# Patient Record
Sex: Female | Born: 1970 | Race: Black or African American | Hispanic: No | Marital: Married | Smoking: Never smoker
Health system: Southern US, Community
[De-identification: ages and names within clinical notes are randomized; demographics above are authoritative.]

## PROBLEM LIST (undated history)

## (undated) DIAGNOSIS — R7689 Other specified abnormal immunological findings in serum: Secondary | ICD-10-CM

## (undated) DIAGNOSIS — R058 Other specified cough: Secondary | ICD-10-CM

## (undated) DIAGNOSIS — K219 Gastro-esophageal reflux disease without esophagitis: Secondary | ICD-10-CM

## (undated) DIAGNOSIS — I10 Essential (primary) hypertension: Secondary | ICD-10-CM

## (undated) DIAGNOSIS — T464X5A Adverse effect of angiotensin-converting-enzyme inhibitors, initial encounter: Secondary | ICD-10-CM

## (undated) DIAGNOSIS — E049 Nontoxic goiter, unspecified: Secondary | ICD-10-CM

## (undated) DIAGNOSIS — D509 Iron deficiency anemia, unspecified: Secondary | ICD-10-CM

## (undated) DIAGNOSIS — R7303 Prediabetes: Secondary | ICD-10-CM

## (undated) DIAGNOSIS — D126 Benign neoplasm of colon, unspecified: Secondary | ICD-10-CM

## (undated) DIAGNOSIS — G4733 Obstructive sleep apnea (adult) (pediatric): Secondary | ICD-10-CM

## (undated) DIAGNOSIS — R768 Other specified abnormal immunological findings in serum: Secondary | ICD-10-CM

## (undated) DIAGNOSIS — E039 Hypothyroidism, unspecified: Secondary | ICD-10-CM

## (undated) DIAGNOSIS — J302 Other seasonal allergic rhinitis: Secondary | ICD-10-CM

## (undated) DIAGNOSIS — E782 Mixed hyperlipidemia: Secondary | ICD-10-CM

## (undated) HISTORY — DX: Other seasonal allergic rhinitis: J30.2

## (undated) HISTORY — DX: Other specified cough: R05.8

## (undated) HISTORY — DX: Benign neoplasm of colon, unspecified: D12.6

## (undated) HISTORY — DX: Prediabetes: R73.03

## (undated) HISTORY — DX: Other specified abnormal immunological findings in serum: R76.8

## (undated) HISTORY — DX: Mixed hyperlipidemia: E78.2

## (undated) HISTORY — DX: Gastro-esophageal reflux disease without esophagitis: K21.9

## (undated) HISTORY — DX: Obstructive sleep apnea (adult) (pediatric): G47.33

## (undated) HISTORY — DX: Essential (primary) hypertension: I10

## (undated) HISTORY — DX: Adverse effect of angiotensin-converting-enzyme inhibitors, initial encounter: T46.4X5A

## (undated) HISTORY — DX: Other specified abnormal immunological findings in serum: R76.89

## (undated) HISTORY — DX: Nontoxic goiter, unspecified: E04.9

## (undated) HISTORY — DX: Iron deficiency anemia, unspecified: D50.9

---

## 2003-12-19 HISTORY — PX: TUBAL LIGATION: SHX77

## 2004-08-09 ENCOUNTER — Encounter: Admission: RE | Admit: 2004-08-09 | Discharge: 2004-08-09 | Payer: Self-pay | Admitting: *Deleted

## 2004-08-09 ENCOUNTER — Ambulatory Visit (HOSPITAL_COMMUNITY): Admission: RE | Admit: 2004-08-09 | Discharge: 2004-08-09 | Payer: Self-pay | Admitting: *Deleted

## 2004-08-24 ENCOUNTER — Inpatient Hospital Stay (HOSPITAL_COMMUNITY): Admission: AD | Admit: 2004-08-24 | Discharge: 2004-09-01 | Payer: Self-pay | Admitting: Obstetrics and Gynecology

## 2004-08-24 ENCOUNTER — Ambulatory Visit: Payer: Self-pay | Admitting: *Deleted

## 2004-08-24 ENCOUNTER — Ambulatory Visit: Payer: Self-pay | Admitting: Sports Medicine

## 2004-08-25 ENCOUNTER — Encounter: Payer: Self-pay | Admitting: Critical Care Medicine

## 2004-08-29 ENCOUNTER — Encounter: Payer: Self-pay | Admitting: Cardiology

## 2004-09-07 ENCOUNTER — Ambulatory Visit: Payer: Self-pay | Admitting: *Deleted

## 2004-09-12 ENCOUNTER — Ambulatory Visit: Payer: Self-pay | Admitting: Obstetrics & Gynecology

## 2004-09-12 ENCOUNTER — Inpatient Hospital Stay (HOSPITAL_COMMUNITY): Admission: AD | Admit: 2004-09-12 | Discharge: 2004-09-12 | Payer: Self-pay | Admitting: Obstetrics & Gynecology

## 2004-09-14 ENCOUNTER — Ambulatory Visit: Payer: Self-pay | Admitting: Pediatrics

## 2004-09-14 ENCOUNTER — Ambulatory Visit: Payer: Self-pay | Admitting: *Deleted

## 2004-09-14 ENCOUNTER — Inpatient Hospital Stay (HOSPITAL_COMMUNITY): Admission: AD | Admit: 2004-09-14 | Discharge: 2004-09-19 | Payer: Self-pay | Admitting: Obstetrics & Gynecology

## 2004-09-18 ENCOUNTER — Encounter (INDEPENDENT_AMBULATORY_CARE_PROVIDER_SITE_OTHER): Payer: Self-pay | Admitting: Specialist

## 2004-10-18 ENCOUNTER — Ambulatory Visit: Payer: Self-pay | Admitting: Obstetrics and Gynecology

## 2004-10-24 ENCOUNTER — Encounter (INDEPENDENT_AMBULATORY_CARE_PROVIDER_SITE_OTHER): Payer: Self-pay | Admitting: Specialist

## 2004-10-24 ENCOUNTER — Ambulatory Visit (HOSPITAL_COMMUNITY): Admission: RE | Admit: 2004-10-24 | Discharge: 2004-10-24 | Payer: Self-pay | Admitting: Family Medicine

## 2004-10-25 ENCOUNTER — Ambulatory Visit: Payer: Self-pay | Admitting: Family Medicine

## 2004-12-21 ENCOUNTER — Ambulatory Visit: Payer: Self-pay | Admitting: Family Medicine

## 2005-02-23 ENCOUNTER — Ambulatory Visit: Payer: Self-pay | Admitting: Obstetrics & Gynecology

## 2005-05-12 ENCOUNTER — Ambulatory Visit: Payer: Self-pay | Admitting: Family Medicine

## 2005-07-12 ENCOUNTER — Ambulatory Visit: Payer: Self-pay | Admitting: Family Medicine

## 2005-09-04 ENCOUNTER — Ambulatory Visit: Payer: Self-pay | Admitting: Family Medicine

## 2006-01-16 ENCOUNTER — Ambulatory Visit: Payer: Self-pay | Admitting: Family Medicine

## 2006-05-23 ENCOUNTER — Encounter: Payer: Self-pay | Admitting: Family Medicine

## 2006-05-23 ENCOUNTER — Ambulatory Visit: Payer: Self-pay | Admitting: Family Medicine

## 2006-05-23 ENCOUNTER — Other Ambulatory Visit: Admission: RE | Admit: 2006-05-23 | Discharge: 2006-05-23 | Payer: Self-pay | Admitting: Family Medicine

## 2006-10-29 ENCOUNTER — Ambulatory Visit: Payer: Self-pay | Admitting: Family Medicine

## 2007-01-18 ENCOUNTER — Ambulatory Visit: Payer: Self-pay | Admitting: Family Medicine

## 2007-05-01 ENCOUNTER — Ambulatory Visit: Payer: Self-pay | Admitting: Family Medicine

## 2007-09-30 ENCOUNTER — Encounter: Payer: Self-pay | Admitting: Family Medicine

## 2007-09-30 ENCOUNTER — Ambulatory Visit: Payer: Self-pay | Admitting: Family Medicine

## 2007-09-30 ENCOUNTER — Other Ambulatory Visit: Admission: RE | Admit: 2007-09-30 | Discharge: 2007-09-30 | Payer: Self-pay | Admitting: Family Medicine

## 2008-02-03 ENCOUNTER — Ambulatory Visit: Payer: Self-pay | Admitting: Family Medicine

## 2008-02-03 LAB — CONVERTED CEMR LAB
Basophils Absolute: 0 10*3/uL (ref 0.0–0.1)
Basophils Relative: 0 % (ref 0–1)
Chloride: 104 meq/L (ref 96–112)
Cholesterol: 160 mg/dL (ref 0–200)
Creatinine, Ser: 0.91 mg/dL (ref 0.40–1.20)
Eosinophils Absolute: 0 10*3/uL (ref 0.0–0.7)
Eosinophils Relative: 1 % (ref 0–5)
Glucose, Bld: 97 mg/dL (ref 70–99)
HCT: 38.1 % (ref 36.0–46.0)
HDL: 44 mg/dL (ref >39)
Hemoglobin: 11.7 g/dL — ABNORMAL LOW (ref 12.0–15.0)
Lymphocytes Relative: 31 % (ref 12–46)
Lymphs Abs: 1.9 10*3/uL (ref 0.7–4.0)
MCHC: 30.7 g/dL (ref 30.0–36.0)
MCV: 89.9 fL (ref 78.0–100.0)
Monocytes Relative: 9 % (ref 3–12)
Neutrophils Relative %: 59 % (ref 43–77)
Platelets: 306 10*3/uL (ref 150–400)
RDW: 15.5 % (ref 11.5–15.5)
Sodium: 139 meq/L (ref 135–145)
Triglycerides: 95 mg/dL (ref <150)

## 2008-04-30 ENCOUNTER — Ambulatory Visit: Payer: Self-pay | Admitting: Family Medicine

## 2008-12-16 ENCOUNTER — Telehealth: Payer: Self-pay | Admitting: Family Medicine

## 2008-12-22 ENCOUNTER — Telehealth: Payer: Self-pay | Admitting: Family Medicine

## 2009-02-01 ENCOUNTER — Ambulatory Visit: Payer: Self-pay | Admitting: Family Medicine

## 2009-02-01 DIAGNOSIS — I10 Essential (primary) hypertension: Secondary | ICD-10-CM | POA: Insufficient documentation

## 2009-02-01 DIAGNOSIS — J3089 Other allergic rhinitis: Secondary | ICD-10-CM | POA: Insufficient documentation

## 2009-02-01 DIAGNOSIS — I1A0 Resistant hypertension: Secondary | ICD-10-CM | POA: Insufficient documentation

## 2009-02-01 LAB — CONVERTED CEMR LAB
CO2: 20 meq/L (ref 19–32)
Calcium: 9.8 mg/dL (ref 8.4–10.5)
Cholesterol: 176 mg/dL (ref 0–200)
Eosinophils Relative: 1 % (ref 0–5)
Glucose, Bld: 87 mg/dL (ref 70–99)
HCT: 39.3 % (ref 36.0–46.0)
LDL Cholesterol: 106 mg/dL — ABNORMAL HIGH (ref 0–99)
MCV: 89.9 fL (ref 78.0–100.0)
Monocytes Absolute: 0.7 10*3/uL (ref 0.1–1.0)
Neutrophils Relative %: 64 % (ref 43–77)
Potassium: 4.5 meq/L (ref 3.5–5.3)
RBC: 4.37 M/uL (ref 3.87–5.11)
TSH: 1.309 microintl units/mL (ref 0.350–4.50)
Total CHOL/HDL Ratio: 3.4
Triglycerides: 91 mg/dL (ref <150)
WBC: 8.1 10*3/uL (ref 4.0–10.5)

## 2009-05-04 ENCOUNTER — Encounter: Payer: Self-pay | Admitting: Family Medicine

## 2009-05-04 ENCOUNTER — Ambulatory Visit: Payer: Self-pay | Admitting: Family Medicine

## 2009-05-04 ENCOUNTER — Other Ambulatory Visit: Admission: RE | Admit: 2009-05-04 | Discharge: 2009-05-04 | Payer: Self-pay | Admitting: Family Medicine

## 2009-05-04 DIAGNOSIS — E049 Nontoxic goiter, unspecified: Secondary | ICD-10-CM | POA: Insufficient documentation

## 2009-05-07 ENCOUNTER — Ambulatory Visit (HOSPITAL_COMMUNITY): Admission: RE | Admit: 2009-05-07 | Discharge: 2009-05-07 | Payer: Self-pay | Admitting: Family Medicine

## 2009-05-10 ENCOUNTER — Telehealth: Payer: Self-pay | Admitting: Family Medicine

## 2009-06-22 ENCOUNTER — Ambulatory Visit: Payer: Self-pay | Admitting: Family Medicine

## 2009-09-22 ENCOUNTER — Telehealth: Payer: Self-pay | Admitting: Family Medicine

## 2009-09-27 ENCOUNTER — Ambulatory Visit: Payer: Self-pay | Admitting: Family Medicine

## 2009-09-27 DIAGNOSIS — K625 Hemorrhage of anus and rectum: Secondary | ICD-10-CM | POA: Insufficient documentation

## 2009-09-28 ENCOUNTER — Encounter (INDEPENDENT_AMBULATORY_CARE_PROVIDER_SITE_OTHER): Payer: Self-pay | Admitting: *Deleted

## 2010-02-11 ENCOUNTER — Ambulatory Visit: Payer: Self-pay | Admitting: Family Medicine

## 2010-05-02 ENCOUNTER — Telehealth: Payer: Self-pay | Admitting: Family Medicine

## 2010-07-19 ENCOUNTER — Telehealth: Payer: Self-pay | Admitting: Family Medicine

## 2010-10-03 ENCOUNTER — Telehealth: Payer: Self-pay | Admitting: Family Medicine

## 2010-12-15 ENCOUNTER — Ambulatory Visit: Payer: Self-pay | Admitting: Family Medicine

## 2010-12-28 ENCOUNTER — Telehealth: Payer: Self-pay | Admitting: Family Medicine

## 2011-01-02 ENCOUNTER — Telehealth: Payer: Self-pay | Admitting: Family Medicine

## 2011-01-04 ENCOUNTER — Ambulatory Visit
Admission: RE | Admit: 2011-01-04 | Discharge: 2011-01-04 | Payer: Self-pay | Source: Home / Self Care | Attending: Family Medicine | Admitting: Family Medicine

## 2011-01-04 ENCOUNTER — Encounter: Payer: Self-pay | Admitting: Family Medicine

## 2011-01-05 ENCOUNTER — Encounter: Payer: Self-pay | Admitting: Family Medicine

## 2011-01-05 DIAGNOSIS — E0590 Thyrotoxicosis, unspecified without thyrotoxic crisis or storm: Secondary | ICD-10-CM | POA: Insufficient documentation

## 2011-01-05 LAB — CONVERTED CEMR LAB: T3, Free: 5.7 pg/mL — ABNORMAL HIGH (ref 2.3–4.2)

## 2011-01-06 LAB — CONVERTED CEMR LAB
ALT: 8 units/L (ref 0–35)
AST: 17 units/L (ref 0–37)
BUN: 9 mg/dL (ref 6–23)
Basophils Absolute: 0 10*3/uL (ref 0.0–0.1)
Basophils Relative: 0 % (ref 0–1)
Calcium: 9.4 mg/dL (ref 8.4–10.5)
Cholesterol: 150 mg/dL (ref 0–200)
Creatinine, Ser: 0.87 mg/dL (ref 0.40–1.20)
Eosinophils Absolute: 0.1 10*3/uL (ref 0.0–0.7)
Eosinophils Relative: 1 % (ref 0–5)
Glucose, Bld: 97 mg/dL (ref 70–99)
HCT: 36.5 % (ref 36.0–46.0)
Hemoglobin: 11.8 g/dL — ABNORMAL LOW (ref 12.0–15.0)
Indirect Bilirubin: 0.3 mg/dL (ref 0.0–0.9)
Lymphocytes Relative: 26 % (ref 12–46)
Sodium: 137 meq/L (ref 135–145)
TSH: 0.008 microintl units/mL — ABNORMAL LOW (ref 0.350–4.500)
Total Bilirubin: 0.4 mg/dL (ref 0.3–1.2)
Total CHOL/HDL Ratio: 3.7
VLDL: 20 mg/dL (ref 0–40)
WBC: 6.6 10*3/uL (ref 4.0–10.5)

## 2011-01-07 ENCOUNTER — Encounter: Payer: Self-pay | Admitting: *Deleted

## 2011-01-08 ENCOUNTER — Encounter: Payer: Self-pay | Admitting: *Deleted

## 2011-01-09 ENCOUNTER — Encounter: Payer: Self-pay | Admitting: Family Medicine

## 2011-01-10 ENCOUNTER — Ambulatory Visit (HOSPITAL_COMMUNITY)
Admission: RE | Admit: 2011-01-10 | Discharge: 2011-01-10 | Payer: Self-pay | Source: Home / Self Care | Attending: Family Medicine | Admitting: Family Medicine

## 2011-01-12 ENCOUNTER — Ambulatory Visit (HOSPITAL_COMMUNITY): Admission: RE | Admit: 2011-01-12 | Payer: Self-pay | Source: Home / Self Care | Admitting: Family Medicine

## 2011-01-13 ENCOUNTER — Ambulatory Visit
Admission: RE | Admit: 2011-01-13 | Discharge: 2011-01-13 | Payer: Self-pay | Source: Home / Self Care | Attending: Family Medicine | Admitting: Family Medicine

## 2011-01-15 DIAGNOSIS — R519 Headache, unspecified: Secondary | ICD-10-CM | POA: Insufficient documentation

## 2011-01-15 DIAGNOSIS — R51 Headache: Secondary | ICD-10-CM | POA: Insufficient documentation

## 2011-01-18 ENCOUNTER — Encounter: Payer: Self-pay | Admitting: Family Medicine

## 2011-01-19 NOTE — Assessment & Plan Note (Signed)
Summary: follow up- room 2   Vital Signs:  Patient profile:   40 year old female Menstrual status:  regular Height:      64 inches Weight:      232.75 pounds BMI:     40.10 O2 Sat:      97 % on Room air Pulse rate:   90 / minute Resp:     16 per minute BP sitting:   130 / 90  (left arm)  Vitals Entered By: Adella Hare LPN (February 11, 2010 9:42 AM)  Nutrition Counseling: Patient's BMI is greater than 25 and therefore counseled on weight management options.  Serial Vital Signs/Assessments:  Time      Position  BP       Pulse  Resp  Temp     By                     128/92                         Adella Hare LPN  CC: follow-up visit Is Patient Diabetic? No Pain Assessment Patient in pain? no        CC:  follow-up visit.  History of Present Illness: Pt here for a follow up of her HTN.  She is taking her medication regularly & no probs with meds.  She denies chest pain, or periph edema.  She [facility] to the [facility] twice a wk for exercise.  Feels like she is eating healthier than she has in the past.  States that she is aware she should try to exercise more.  She also has a hx of allergic rhinitis.  Uses Allegra as needed for nasal congestion & eye syptoms.  Pt is due for her annual PE in May 2011.  Her labs were last done 01/1009.  Current Medications (verified): 1)  Benicar Hct 40-25 Mg Tabs (Olmesartan Medoxomil-Hctz) .... Take 1 Tablet By Mouth Once A Day 2)  Labetalol Hcl 200 Mg Tabs (Labetalol Hcl) .... Take 1 Tablet By Mouth Once A Day  Allergies (verified): No Known Drug Allergies  Past History:  Past medical history reviewed for relevance to current acute and chronic problems.  Past Medical History: Reviewed history from 02/01/2009 and no changes required. Hypertension obesity  Review of Systems General:  Denies chills and fever. ENT:  Denies earache, sinus pressure, and sore throat. CV:  Denies chest pain or discomfort. Resp:  Denies cough and shortness  of breath. GI:  Denies bloody stools, change in bowel habits, and dark tarry stools. Heme:  Denies enlarge lymph nodes and fevers. Allergy:  Complains of seasonal allergies.  Physical Exam  General:  Well-developed,well-nourished,in no acute distress; alert,appropriate and cooperative throughout examination Head:  Normocephalic and atraumatic without obvious abnormalities. No apparent alopecia or balding. Ears:  External ear exam shows no significant lesions or deformities.  Otoscopic examination reveals clear canals, tympanic membranes are intact bilaterally without bulging, retraction, inflammation or discharge. Hearing is grossly normal bilaterally. Nose:  External nasal examination shows no deformity or inflammation. Nasal mucosa are pink and moist without lesions or exudates. Mouth:  Oral mucosa and oropharynx without lesions or exudates.  Teeth in good repair. Neck:  No deformities, masses, or tenderness noted. Lungs:  Normal respiratory effort, chest expands symmetrically. Lungs are clear to auscultation, no crackles or wheezes. Heart:  Normal rate and regular rhythm. S1 and S2 normal without gallop, murmur, click, rub or other extra sounds.  Extremities:  No clubbing, cyanosis, edema, or deformity noted with normal full range of motion of all joints.   Cervical Nodes:  No lymphadenopathy noted Psych:  Cognition and judgment appear intact. Alert and cooperative with normal attention span and concentration. No apparent delusions, illusions, hallucinations   Impression & Recommendations:  Problem # 1:  HYPERTENSION (ICD-401.9) Assessment Unchanged Continue current meds.  Pt has refills avail.  Her updated medication list for this problem includes:    Benicar Hct 40-25 Mg Tabs (Olmesartan medoxomil-hctz) .Marland Kitchen... Take 1 tablet by mouth once a day    Labetalol Hcl 200 Mg Tabs (Labetalol hcl) .Marland Kitchen... Take 1 tablet by mouth once a day  BP today: 130/90, rpt BP 128/92 Prior BP: 110/74  (09/27/2009)  Labs Reviewed: K+: 4.5 (02/01/2009) Creat: : 1.05 (02/01/2009)   Chol: 176 (02/01/2009)   HDL: 52 (02/01/2009)   LDL: 106 (02/01/2009)   TG: 91 (02/01/2009)  Orders: T-Basic Metabolic Panel [phone]) T-Vitamin D (25-Hydroxy) ([phone])  Problem # 2:  OBESITY, UNSPECIFIED (ICD-278.00) Assessment: Deteriorated  Ht: 64 (02/11/2010)   Wt: 232.75 (02/11/2010)   BMI: 40.10 (02/11/2010)  Encouraged increased exercise, min of 3 days /wk.  Also discussed healthy diet.  Problem # 3:  ALLERGIC RHINITIS CAUSE UNSPECIFIED (ICD-477.9)  The following medications were removed from the medication list:    Allegra 180 Mg Tabs (Fexofenadine hcl) .Marland Kitchen... Take 1 tablet by mouth once a day  Discussed use of allergy medications.  Complete Medication List: 1)  Benicar Hct 40-25 Mg Tabs (Olmesartan medoxomil-hctz) .... Take 1 tablet by mouth once a day 2)  Labetalol Hcl 200 Mg Tabs (Labetalol hcl) .... Take 1 tablet by mouth once a day  Other Orders: T-Lipid Profile ([id number]) T-CBC No Diff ([id number]) T-TSH ([id number])  Patient Instructions: 1)  Please schedule a follow-up appointment in 3 months. 2)  It is important that you exercise regularly at least 20 minutes 5 times a week. If you develop chest pain, have severe difficulty breathing, or feel very tired , stop exercising immediately and seek medical attention. 3)  You need to lose weight. Consider a lower calorie diet and regular exercise.

## 2011-01-19 NOTE — Progress Notes (Signed)
Summary: medicine  Phone Note Call from Patient   Summary of Call: needs samples of blood pressure medicine [phone] Initial call taken by: Rudene Anda,  October 03, 2010 10:55 AM  Follow-up for Phone Call        no samples available  called patient, no answer Follow-up by: Adella Hare LPN,  October 03, 2010 12:10 PM     Appended Document: medicine PATIENT AWARE AND IF YOU GET ANY LET HER KNOW

## 2011-01-19 NOTE — Progress Notes (Signed)
Summary: samples  Phone Note Call from Patient   Summary of Call: said dr told her if she needed bp medicine call her for some samples she is out of work she needs samples of bp med call back at [phone] Initial call taken by: Lind Guest,  May 02, 2010 1:20 PM  Follow-up for Phone Call        Will call drug rep for some and call patient back when available Follow-up by: Everitt Amber LPN,  May 02, 2010 1:37 PM

## 2011-01-19 NOTE — Letter (Signed)
Summary: 1st missed appt  1st missed appt   Imported By: Lind Guest 12/20/2010 13:42:28  _____________________________________________________________________  External Attachment:    Type:   Image     Comment:   External Document

## 2011-01-19 NOTE — Progress Notes (Signed)
Summary: samples  Phone Note Call from Patient   Summary of Call: pt needs samples of blood pressure pills. [phone] Initial call taken by: Rudene Anda,  July 19, 2010 9:35 AM  Follow-up for Phone Call        left patient detailed voicemail that samples are available Follow-up by: Adella Hare LPN,  July 19, 2010 9:39 AM

## 2011-01-19 NOTE — Letter (Signed)
Summary: lab add on  lab add on   Imported By: Luann Bullins 01/05/2011 14:36:36  _____________________________________________________________________  External Attachment:    Type:   Image     Comment:   External Document

## 2011-01-19 NOTE — Progress Notes (Signed)
Summary: blood pressure medicine  Phone Note Call from Patient   Summary of Call: [facility] and still has not received anything  Initial call taken by: Lind Guest,  December 28, 2010 2:06 PM  Follow-up for Phone Call        spoke with pharmacy, patient has picked up meds Follow-up by: Adella Hare LPN,  December 28, 2010 2:42 PM

## 2011-01-19 NOTE — Letter (Signed)
Summary: Out of [facility]  [address]   [place], [place] [address]   Phone: [phone]  Fax: [phone]    January 04, 2011   Student:  [employer]    To Whom It May Concern:   For Medical reasons, please excuse the above named student from school for the following dates:  Start:   January 04, 2011  End:    January 09, 2011  If you need additional information, please feel free to contact our office.   Sincerely,    Milus Mallick. Lodema Hong, MD    ****This is a legal document and cannot be tampered with.  Schools are authorized to verify all information and to do so accordingly.

## 2011-01-19 NOTE — Letter (Signed)
Summary: outpatient nutritionalcare order  outpatient nutritionalcare order   Imported By: Lind Guest 01/09/2011 16:26:14  _____________________________________________________________________  External Attachment:    Type:   Image     Comment:   External Document

## 2011-01-19 NOTE — Progress Notes (Signed)
Summary: medicine  Phone Note Call from Patient   Summary of Call: pt states pharm doesn't have rx. but told her it had been sent in. may want to resend. [phone] Initial call taken by: Rudene Anda,  January 02, 2011 10:26 AM    Prescriptions: BENICAR HCT 40-25 MG TABS (OLMESARTAN MEDOXOMIL-HCTZ) Take 1 tablet by mouth once a day  #30 Tablet x 0   Entered by:   Adella Hare LPN   Authorized by:   Syliva Overman MD   Signed by:   Adella Hare LPN on 45/40/9811   Method used:   [facility] [phone]* (retail)       [address]       [place], [place]  [address]       Ph: [phone]       Fax: [phone]   RxID:   [id number] BENICAR HCT 40-25 MG TABS (OLMESARTAN MEDOXOMIL-HCTZ) Take 1 tablet by mouth once a day  #30 Tablet x 0   Entered by:   Adella Hare LPN   Authorized by:   Syliva Overman MD   Signed by:   Adella Hare LPN on 03/47/4259   Method used:   [facility]ically to        [facility] [phone]* (retail)       [address]       [place], [place]  [address]       Ph: [phone]       Fax: [phone]   RxID:   [id number]

## 2011-01-24 ENCOUNTER — Ambulatory Visit: Payer: Self-pay | Admitting: Endocrinology

## 2011-01-25 NOTE — Assessment & Plan Note (Signed)
Summary: FOLLOW UP ON B/P MEDICATION/SLJ   Vital Signs:  Patient profile:   40 year old female Menstrual status:  regular Height:      64 inches Weight:      229.25 pounds BMI:     39.49 O2 Sat:      98 % Pulse rate:   99 / minute Pulse rhythm:   regular Resp:     16 per minute BP sitting:   200 / 120  (left arm)  Vitals Entered By: Everitt Amber LPN (January 04, 2011 8:23 AM)  Nutrition Counseling: Patient's BMI is greater than 25 and therefore counseled on weight management options. CC: Follow up chronic problems   CC:  Follow up chronic problems.  History of Present Illness: Reports  that she has been well. She waslst seen approx 1 year ago, she has no concerns , and is here because of the need to refill he meds. she does walk regularly, admits that her diet needs to improve, her weight is unchanged. She is a full time student, and enjoying it. Denies recent fever or chills. Denies sinus pressure, nasal congestion , ear pain or sore throat. Denies chest congestion, or cough productive of sputum. Denies chest pain, palpitations, PND, orthopnea or leg swelling. Denies abdominal pain, nausea, vomitting, diarrhea or constipation. Denies change in bowel movements or bloody stool. Denies dysuria , frequency, incontinence or hesitancy. Denies  joint pain, swelling, or reduced mobility. Denies, vertigoor  seizures. Denies depression, anxiety or insomnia. Denies  rash, lesions, or itch.     Current Medications (verified): 1)  Benicar Hct 40-25 Mg Tabs (Olmesartan Medoxomil-Hctz) .... Take 1 Tablet By Mouth Once A Day 2)  Labetalol Hcl 200 Mg Tabs (Labetalol Hcl) .... 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 Eyes:  Denies blurring and discharge. Neuro:  Complains of headaches; 5 day h/o headache. Endo:  Denies cold intolerance, excessive hunger, excessive thirst, excessive urination, and heat intolerance. Heme:  Denies abnormal bruising and bleeding. Allergy:  Complains of seasonal allergies.  Physical Exam  General:  Well-developed,obese,in no acute distress; alert,appropriate and cooperative throughout examination HEENT: No facial asymmetry,  EOMI, No sinus tenderness, TM's Clear, oropharynx  pink and moist.   Chest: Clear to auscultation bilaterally.  CVS: S1, S2, No murmurs, No S3.   Abd: Soft, Nontender.  MS: Adequate ROM spine, hips, shoulders and knees.  Ext: No edema.   CNS: CN 2-12 intact, power tone and sensation normal throughout.   Skin: Intact, no visible lesions or rashes.  Psych: Good eye contact, normal affect.  Memory intact, not anxious or depressed appearing.    Impression & Recommendations:  Problem # 1:  HYPERTHYROIDISM (ICD-242.90) Assessment Comment Only  Her updated medication list for this problem includes:    Labetalol Hcl 200 Mg Tabs (Labetalol hcl) .Marland Kitchen... Take 1 tablet by mouth once a day  Future Orders: Endocrinology Referral (Endocrine) ... 01/06/2011 thyroid US also ordered  Problem # 2:  HYPERTENSION (ICD-401.9) Assessment: Deteriorated  The following medications were removed from the medication list:    Benicar Hct  40-25 Mg Tabs (Olmesartan medoxomil-hctz) .Marland Kitchen... Take 1 tablet by mouth once a day Her updated medication list for this problem includes:    Labetalol Hcl 200 Mg Tabs (Labetalol hcl) .Marland Kitchen... Take 1 tablet by mouth once a day    Benicar Hct 40-25 Mg Tabs (Olmesartan medoxomil-hctz) .Marland Kitchen... Take 1 tablet by mouth once a day    Amlodipine Besylate 5 Mg Tabs (Amlodipine besylate) .Marland Kitchen... Take 1 tablet by mouth once a  day Patient advised to follow low sodium diet rich in fruit and vegetables, and to commit to at least 30 minutes 5 days per week of regular exercise , to improve blood presure control.   Orders: T-Basic Metabolic Panel [id number])  BP today: 200/120 Prior BP: 130/90 (02/11/2010)  Labs Reviewed: K+: 4.5 (02/01/2009) Creat: : 1.05 (02/01/2009)   Chol: 176 (02/01/2009)   HDL: 52 (02/01/2009)   LDL: 106 (02/01/2009)   TG: 91 (02/01/2009)  Problem # 3:  OBESITY, UNSPECIFIED (ICD-278.00) Assessment: Comment Only  Orders: T-TSH ([id number]) T- Hemoglobin A1C ([id number])  Ht: 64 (01/04/2011)   Wt: 229.25 (01/04/2011)   BMI: 39.49 (01/04/2011)  Pt advised to reduce carbohydrate intake, espescially sweets, and to start regular physical activity, at least 30 minutes 5 days weekly, to enable weight loss, and reduce the risk of becoming diabetic  therapeutic lifestyle change discussed and encouraged  Problem # 5:  HEADACHE (ICD-784.0) Assessment: Comment Only  Her updated medication list for this problem includes:    Labetalol Hcl 200 Mg Tabs (Labetalol hcl) .Marland Kitchen... Take 1 tablet by mouth once a day normal neurologic exam, likely related to severe hTN, pt to take tylenol, ED if worsens  Complete Medication List: 1)  Labetalol Hcl 200 Mg Tabs (Labetalol hcl) .... Take 1 tablet by mouth once a day 2)  Benicar Hct 40-25 Mg Tabs (Olmesartan medoxomil-hctz) .... Take 1 tablet by mouth once a day 3)  Amlodipine Besylate 5 Mg Tabs (Amlodipine besylate) .... Take 1 tablet by mouth once a day  Other Orders: T-Lipid Profile [id number]) T-Hepatic Function [id number]) T-CBC w/Diff [id number]) Radiology Referral (Radiology) Future Orders: Radiology Referral (Radiology) ... 01/06/2011  Patient Instructions: 1)  nurse BP check in  7 to 10 days 2)  cPE and pap  in 4 weeks. 3)  fasting labs today. 4)  bP is extremely high, take tylenol and your meds and rest. 5)  school excuse to return  01/09/2011. 6)  we will give you the dASH diet 7)  new meds for high blood pressure as discussed 8)  BMP prior to visit, ICD-9: 9)  Hepatic Panel prior to visit, ICD-9: 10)  Lipid Panel prior to visit, ICD-9: 11)  TSH prior to visit, ICD-9: 12)  CBC w/ Diff prior to visit, ICD-9: 13)  HbgA1C prior to visit, ICD-9: Prescriptions: AMLODIPINE BESYLATE 5 MG TABS (AMLODIPINE BESYLATE) Take 1 tablet by mouth once a day  #30 x 1   Entered and Authorized by:   Syliva Overman MD   Signed by:   Syliva Overman MD on 01/04/2011   Method used:   [facility] to        [facility]. [phone]* (retail)       [address]       [place], [place]  [address]       Ph: [phone]       Fax: [phone]   RxID:   [id number] BENICAR HCT 40-25 MG TABS (OLMESARTAN MEDOXOMIL-HCTZ) Take 1 tablet  by mouth once a day  #30 x 2   Entered and Authorized by:   Syliva Overman MD   Signed by:   Syliva Overman MD on 01/04/2011   Method used:   Electronically to        [facility]. [phone]* (retail)       [address]       [place], [place]  [address]       Ph: [phone]       Fax: [phone]   RxID:   [id number]    Orders Added: 1)  Est. Patient Level IV [27253] 2)  Est. Patient Level IV [66440] 3)  T-Basic Metabolic Panel [[id number]] 4)  T-Lipid Profile [[id number]] 5)  T-Hepatic Function [[id number]] 6)  T-CBC w/Diff [[id number]] 7)  T-TSH [[id number]] 8)  T- Hemoglobin A1C [83036-23375] 9)  Radiology Referral [Radiology] 10)  Radiology Referral [Radiology] 11)  Endocrinology Referral [Endocrine]

## 2011-01-25 NOTE — Assessment & Plan Note (Signed)
Summary: B P CHECK  Nurse Visit   Vital Signs:  Patient profile:   40 year old female Menstrual status:  regular Height:      64 inches Weight:      228 pounds BP sitting:   120 / 88 Cuff size:   xl  Vitals Entered By: Everitt Amber LPN (January 13, 2011 11:27 AM) Comments Nurse visit for BP check    Allergies: No Known Drug Allergies bP rechecked and is 120/88, pt to continue current meds

## 2011-01-25 NOTE — Letter (Signed)
Summary: adult oupatient assessment form  adult oupatient assessment form   Imported By: Lind Guest 01/18/2011 11:30:46  _____________________________________________________________________  External Attachment:    Type:   Image     Comment:   External Document

## 2011-02-08 ENCOUNTER — Telehealth: Payer: Self-pay | Admitting: Family Medicine

## 2011-02-08 ENCOUNTER — Encounter (INDEPENDENT_AMBULATORY_CARE_PROVIDER_SITE_OTHER): Payer: PRIVATE HEALTH INSURANCE | Admitting: Family Medicine

## 2011-02-08 ENCOUNTER — Other Ambulatory Visit (HOSPITAL_COMMUNITY)
Admission: RE | Admit: 2011-02-08 | Discharge: 2011-02-08 | Disposition: A | Discharge status: Home / Self Care | Payer: PRIVATE HEALTH INSURANCE | Source: Ambulatory Visit | Attending: Family Medicine | Admitting: Family Medicine

## 2011-02-08 ENCOUNTER — Encounter: Payer: Self-pay | Admitting: Family Medicine

## 2011-02-08 ENCOUNTER — Other Ambulatory Visit: Payer: Self-pay | Admitting: Family Medicine

## 2011-02-08 DIAGNOSIS — R7301 Impaired fasting glucose: Secondary | ICD-10-CM | POA: Insufficient documentation

## 2011-02-08 DIAGNOSIS — Z0000 Encounter for general adult medical examination without abnormal findings: Secondary | ICD-10-CM

## 2011-02-08 DIAGNOSIS — Z01419 Encounter for gynecological examination (general) (routine) without abnormal findings: Secondary | ICD-10-CM | POA: Insufficient documentation

## 2011-02-08 DIAGNOSIS — Z1211 Encounter for screening for malignant neoplasm of colon: Secondary | ICD-10-CM

## 2011-02-08 DIAGNOSIS — Z124 Encounter for screening for malignant neoplasm of cervix: Secondary | ICD-10-CM

## 2011-02-08 LAB — CONVERTED CEMR LAB: OCCULT 1: POSITIVE

## 2011-02-13 ENCOUNTER — Telehealth: Payer: Self-pay | Admitting: Family Medicine

## 2011-02-13 ENCOUNTER — Encounter: Payer: Self-pay | Admitting: Family Medicine

## 2011-02-13 DIAGNOSIS — K921 Melena: Secondary | ICD-10-CM | POA: Insufficient documentation

## 2011-02-13 LAB — CONVERTED CEMR LAB: OCCULT 3: POSITIVE

## 2011-02-14 ENCOUNTER — Telehealth (INDEPENDENT_AMBULATORY_CARE_PROVIDER_SITE_OTHER): Payer: Self-pay | Admitting: *Deleted

## 2011-02-14 NOTE — Progress Notes (Signed)
Summary: medicine  Phone Note Call from Patient   Summary of Call: [facility] [place] Initial call taken by: Lind Guest,  February 08, 2011 4:13 PM  Follow-up for Phone Call        already sent  Follow-up by: Everitt Amber LPN,  February 08, 2011 4:19 PM

## 2011-02-15 ENCOUNTER — Other Ambulatory Visit (HOSPITAL_COMMUNITY): Payer: Self-pay | Admitting: "Endocrinology

## 2011-02-15 ENCOUNTER — Encounter: Payer: Self-pay | Admitting: Family Medicine

## 2011-02-15 DIAGNOSIS — R7989 Other specified abnormal findings of blood chemistry: Secondary | ICD-10-CM

## 2011-02-16 ENCOUNTER — Encounter: Payer: Self-pay | Admitting: Family Medicine

## 2011-02-22 ENCOUNTER — Encounter (HOSPITAL_COMMUNITY): Payer: Self-pay

## 2011-02-22 ENCOUNTER — Encounter (HOSPITAL_COMMUNITY)
Admission: RE | Admit: 2011-02-22 | Discharge: 2011-02-22 | Disposition: A | Discharge status: Home / Self Care | Payer: PRIVATE HEALTH INSURANCE | Source: Ambulatory Visit | Attending: "Endocrinology | Admitting: "Endocrinology

## 2011-02-22 DIAGNOSIS — E042 Nontoxic multinodular goiter: Secondary | ICD-10-CM | POA: Insufficient documentation

## 2011-02-22 DIAGNOSIS — E349 Endocrine disorder, unspecified: Secondary | ICD-10-CM | POA: Insufficient documentation

## 2011-02-22 DIAGNOSIS — R7989 Other specified abnormal findings of blood chemistry: Secondary | ICD-10-CM

## 2011-02-22 MED ORDER — SODIUM IODIDE I 131 CAPSULE
10.0000 | Freq: Once | INTRAVENOUS | Status: AC | PRN
  Administered 2011-02-22: 17 via ORAL

## 2011-02-23 ENCOUNTER — Ambulatory Visit (HOSPITAL_COMMUNITY): Admission: RE | Admit: 2011-02-23 | Payer: PRIVATE HEALTH INSURANCE | Source: Ambulatory Visit

## 2011-02-23 MED ORDER — SODIUM PERTECHNETATE TC 99M INJECTION
10.0000 | Freq: Once | INTRAVENOUS | Status: AC | PRN
  Administered 2011-02-23: 10 via INTRAVENOUS

## 2011-02-23 NOTE — Assessment & Plan Note (Signed)
Summary: PHYSICAL   Vital Signs:  Patient profile:   40 year old female Menstrual status:  regular Height:      64 inches Weight:      235 pounds BMI:     40.48 O2 Sat:      97 % Pulse rate:   87 / minute Pulse rhythm:   regular Resp:     16 per minute BP supine:   140 / 90  (right arm) BP sitting:   122 / 90  (left arm) Cuff size:   xl  Vitals Entered By: Everitt Amber LPN (February 08, 2011 3:20 PM)  Nutrition Counseling: Patient's BMI is greater than 25 and therefore counseled on weight management options. CC: CPE   Vision Screening:Left eye with correction: 20 / 20 Right eye with correction: 20 / 20 Both eyes with correction: 20 / 20  Color vision testing: normal       CC:  CPE .  History of Present Illness: Reports  that she has been doing fairly well. Denies recent fever or chills. Denies sinus pressure, nasal congestion , ear pain or sore throat. Denies chest congestion, or cough productive of sputum. Denies chest pain, palpitations, PND, orthopnea or leg swelling. Denies abdominal pain, nausea, vomitting, diarrhea or constipation. Denies change in bowel movements or bloody stool. Denies dysuria , frequency, incontinence or hesitancy. Denies  joint pain, swelling, or reduced mobility. Denies headaches, vertigo, seizures. Denies depression, anxiety or insomnia. Denies  rash, lesions, or itch.     Current Medications (verified): 1)  Labetalol Hcl 200 Mg Tabs (Labetalol Hcl) .... Take 1 Tablet By Mouth Once A Day 2)  Benicar Hct 40-25 Mg Tabs (Olmesartan Medoxomil-Hctz) .... Take 1 Tablet By Mouth Once A Day 3)  Amlodipine Besylate 5 Mg Tabs (Amlodipine Besylate) .... Take 1 Tablet By Mouth Once A Day  Allergies (verified): No Known Drug Allergies  Review of Systems      See HPI General:  Complains of fatigue. Eyes:  Denies blurring, discharge, eye pain, and red eye. Endo:  Denies cold intolerance, excessive hunger, excessive thirst, and  polyuria. Heme:  Denies abnormal bruising, bleeding, enlarge lymph nodes, and fevers. Allergy:  Complains of seasonal allergies.  Physical Exam  General:  Well-developed,obese,in no acute distress; alert,appropriate and cooperative throughout examination Head:  Normocephalic and atraumatic without obvious abnormalities. No apparent alopecia or balding. Eyes:  No corneal or conjunctival inflammation noted. EOMI. Perrla. Funduscopic exam benign, without hemorrhages, exudates or papilledema. Vision grossly normal. Ears:  External ear exam shows no significant lesions or deformities.  Otoscopic examination reveals clear canals, tympanic membranes are intact bilaterally without bulging, retraction, inflammation or discharge. Hearing is grossly normal bilaterally. Nose:  External nasal examination shows no deformity or inflammation. Nasal mucosa are pink and moist without lesions or exudates. Mouth:  Oral mucosa and oropharynx without lesions or exudates.  Teeth in good repair. Neck:  No deformities, masses, or tenderness noted. Chest Wall:  No deformities, masses, or tenderness noted. Breasts:  No mass, nodules, thickening, tenderness, bulging, retraction, inflamation, nipple discharge or skin changes noted.   Lungs:  Normal respiratory effort, chest expands symmetrically. Lungs are clear to auscultation, no crackles or wheezes. Heart:  Normal rate and regular rhythm. S1 and S2 normal without gallop, murmur, click, rub or other extra sounds. Abdomen:  Bowel sounds positive,abdomen soft and non-tender without masses, organomegaly or hernias noted. Rectal:  No external abnormalities noted. Normal sphincter tone. No rectal masses or tenderness.Guaic positive stool Genitalia:  Normal introitus for age, no external lesions, no vaginal discharge, mucosa pink and moist, no vaginal or cervical lesions, no vaginal atrophy, no friaility or hemorrhage, normal uterus size and position, no adnexal masses or  tenderness Msk:  No deformity or scoliosis noted of thoracic or lumbar spine.   Pulses:  R and L carotid,radial,femoral,dorsalis pedis and posterior tibial pulses are full and equal bilaterally Extremities:  No clubbing, cyanosis, edema, or deformity noted with normal full range of motion of all joints.   Neurologic:  No cranial nerve deficits noted. Station and gait are normal. Plantar reflexes are down-going bilaterally. DTRs are symmetrical throughout. Sensory, motor and coordinative functions appear intact. Skin:  Intact without suspicious lesions or rashes Cervical Nodes:  No lymphadenopathy noted Axillary Nodes:  No palpable lymphadenopathy Inguinal Nodes:  No significant adenopathy Psych:  Cognition and judgment appear intact. Alert and cooperative with normal attention span and concentration. No apparent delusions, illusions, hallucinations   Impression & Recommendations:  Problem # 1:  GUAIAC POSITIVE STOOL (ICD-578.1) Assessment Comment Only  Orders: Gastroenterology Referral (GI)  Problem # 2:  SCREENING FOR MALIGNANT NEOPLASM OF THE CERVIX (ICD-V76.2) Assessment: Comment Only pap sent Orders: Pap Smear (16109)  Problem # 3:  IMPAIRED FASTING GLUCOSE (ICD-790.21) Assessment: Comment Only  Orders: T- Hemoglobin A1C (83036-23375),value is6.2, pt is prediaBETIC, AND IS COUNSELLED EXTENSIVELY  Problem # 4:  OBESITY, UNSPECIFIED (ICD-278.00) Assessment: Deteriorated  Ht: 64 (02/08/2011)   Wt: 235 (02/08/2011)   BMI: 40.48 (02/08/2011) therapeutic lifestyle change discussed and encouraged  Problem # 5:  HYPERTENSION (ICD-401.9) Assessment: Unchanged  Her updated medication list for this problem includes:    Labetalol Hcl 200 Mg Tabs (Labetalol hcl) .Marland Kitchen... Take 1 tablet by mouth once a day    Hyzaar 100-25 Mg Tabs (Losartan potassium-hctz) .Marland Kitchen... Take 1 tablet by mouth once a day    Amlodipine Besylate 5 Mg Tabs (Amlodipine besylate) .Marland Kitchen... Take 1 tablet by mouth once a  day  Orders: T-Basic Metabolic Panel [phone])  BP today: 122/90 Prior BP: 120/88 (01/13/2011)  Labs Reviewed: K+: 4.4 (01/04/2011) Creat: : 0.87 (01/04/2011)   Chol: 150 (01/04/2011)   HDL: 41 (01/04/2011)   LDL: 89 (01/04/2011)   TG: 102 (01/04/2011)  Problem # 6:  HYPERTHYROIDISM (ICD-242.90) Assessment: Comment Only  Her updated medication list for this problem includes:    Labetalol Hcl 200 Mg Tabs (Labetalol hcl) .Marland Kitchen... Take 1 tablet by mouth once a day  Orders: Endocrinology Referral (Endocrine)  Labs Reviewed: TSH: 0.008 (01/04/2011)   Total T4: 13.4 (01/05/2011)     Complete Medication List: 1)  Labetalol Hcl 200 Mg Tabs (Labetalol hcl) .... Take 1 tablet by mouth once a day 2)  Hyzaar 100-25 Mg Tabs (Losartan potassium-hctz) .... Take 1 tablet by mouth once a day 3)  Amlodipine Besylate 5 Mg Tabs (Amlodipine besylate) .... Take 1 tablet by mouth once a day 4)  Loratidine 10mg   .... Take 1 tablet by mouth once a day  Other Orders: Hemoccult Guaiac-1 spec.(in office) (91478)  Patient Instructions: 1)  f/u April  20 or after 2)  It is important that you exercise regularly at least 20 minutes 5 times a week. If you develop chest pain, have severe difficulty breathing, or feel very tired , stop exercising immediately and seek medical attention. 3)  You need to lose weight. Consider a lower calorie diet and regular exercise.  4)  HbgA1C prior to visit, ICD-9:  April 18 or after, and chem 7 Prescriptions: HYZAAR  100-25 MG TABS (LOSARTAN POTASSIUM-HCTZ) Take 1 tablet by mouth once a day  #30 x 3   Entered by:   Everitt Amber LPN   Authorized by:   Syliva Overman MD   Signed by:   Everitt Amber LPN on 81/19/1478   Method used:   Printed then faxed to ...       [facility]  [address] [phone]* (retail)       [address]       [place], [place]  [address]       Ph: [phone]       Fax: [phone]   RxID:   [id number] AMLODIPINE BESYLATE 5 MG TABS  (AMLODIPINE BESYLATE) Take 1 tablet by mouth once a day  #30 x 4   Entered by:   Everitt Amber LPN   Authorized by:   Syliva Overman MD   Signed by:   Everitt Amber LPN on 25/95/6387   Method used:   Electronically to        [facility] [phone]* (retail)       [address]       [place], [place]  [address]       Ph: [phone]       Fax: [phone]   RxID:   [id number] LORATIDINE 10MG  Take 1 tablet by mouth once a day  #90 x 1   Entered by:   Everitt Amber LPN   Authorized by:   Syliva Overman MD   Signed by:   Everitt Amber LPN on 62/83/1517   Method used:   Printed then faxed to ...       [facility] [phone]* (retail)       [address]       [place], [place]  [address]       Ph: [phone]       Fax: [phone]   RxID:   [id number]  Benicar changed to Hyzaar per Dr. Lodema Hong due to non coverage   Orders Added: 1)  Est. Patient 40-64 years [99396] 2)  T-Basic Metabolic Panel [phone] 3)  T- Hemoglobin A1C [83036-23375] 4)  Hemoccult Guaiac-1 spec.(in office) [82270] 5)  Pap Smear [88150] 6)  Endocrinology Referral [Endocrine] 7)  Gastroenterology Referral [GI]    Laboratory Results    Stool - Occult Blood Hemmoccult #1: positive Date: 02/08/2011 Comments: 51301 13L 10/13 5030 5/14

## 2011-02-23 NOTE — Letter (Signed)
Summary: D/C MEDICINE  D/C MEDICINE   Imported By: Lind Guest 02/15/2011 10:50:42  _____________________________________________________________________  External Attachment:    Type:   Image     Comment:   External Document

## 2011-02-23 NOTE — Progress Notes (Signed)
Summary: bp medicine  Phone Note Call from Patient   Summary of Call: would like to get another blood pressure med. pt states something about benica. please give her a call [phone] Initial call taken by: Rudene Anda,  February 13, 2011 11:19 AM  Follow-up for Phone Call        Insurance not covering Benicar. please change to something cheaper Follow-up by: Everitt Amber LPN,  February 14, 2011 2:17 PM  Additional Follow-up for Phone Call Additional follow up Details #1::        Changed to Hyzaar 100/25 per dr. Lodema Hong. Patient aware Additional Follow-up by: Everitt Amber LPN,  February 14, 2011 4:31 PM

## 2011-02-23 NOTE — Progress Notes (Signed)
Summary: referral  Phone Note Call from Patient   Summary of Call: pt has appt with dr. Karilyn Cota on 03/13/2011 2:15. pt is aware Initial call taken by: Rudene Anda,  February 14, 2011 9:04 AM

## 2011-02-23 NOTE — Miscellaneous (Signed)
Summary: Stool cards  Clinical Lists Changes  Observations: Added new observation of HEMOCCU 3 DT: 02/13/2011 (02/13/2011 10:45) Added new observation of HEMOCCU 2 DT: 02/12/2011 (02/13/2011 10:45) Added new observation of HEMOCCU 1 DT: 02/11/2011 (02/13/2011 10:45) Added new observation of LAB RESULTS: 51201 8L 8/13 5030 5/14 (02/13/2011 10:45) Added new observation of HEMOCCULT 3: positive (02/13/2011 10:45) Added new observation of HEMOCCULT 2: positive (02/13/2011 10:45) Added new observation of HEMOCCULT 1: positive (02/13/2011 10:45)     Laboratory Results    Stool - Occult Blood Hemmoccult #1: positive Date: 02/11/2011 Hemoccult #2: positive Date: 02/12/2011 Hemoccult #3: positive Date: 02/13/2011 Comments: 51201 8L 8/13 5030 5/14

## 2011-02-23 NOTE — Letter (Signed)
Summary: Dr. Fransico Him  Dr. Fransico Him   Imported By: Lind Guest 02/17/2011 14:47:58  _____________________________________________________________________  External Attachment:    Type:   Image     Comment:   External Document

## 2011-02-24 ENCOUNTER — Other Ambulatory Visit (HOSPITAL_COMMUNITY): Payer: Self-pay | Admitting: "Endocrinology

## 2011-02-24 DIAGNOSIS — E0590 Thyrotoxicosis, unspecified without thyrotoxic crisis or storm: Secondary | ICD-10-CM

## 2011-02-28 ENCOUNTER — Encounter (HOSPITAL_COMMUNITY): Payer: Self-pay

## 2011-02-28 ENCOUNTER — Ambulatory Visit (HOSPITAL_COMMUNITY)
Admission: RE | Admit: 2011-02-28 | Discharge: 2011-02-28 | Disposition: A | Discharge status: Home / Self Care | Payer: PRIVATE HEALTH INSURANCE | Source: Ambulatory Visit | Attending: "Endocrinology | Admitting: "Endocrinology

## 2011-02-28 DIAGNOSIS — E0590 Thyrotoxicosis, unspecified without thyrotoxic crisis or storm: Secondary | ICD-10-CM | POA: Insufficient documentation

## 2011-02-28 MED ORDER — SODIUM IODIDE I 131 CAPSULE
25.0000 | Freq: Once | INTRAVENOUS | Status: AC | PRN
  Administered 2011-02-28: 26.8 via ORAL

## 2011-03-08 ENCOUNTER — Other Ambulatory Visit: Payer: Self-pay

## 2011-03-08 MED ORDER — AMLODIPINE BESYLATE 10 MG PO TABS: 5.0000 mg | ORAL_TABLET | Freq: Every day | ORAL | Status: DC

## 2011-03-11 ENCOUNTER — Other Ambulatory Visit: Payer: Self-pay | Admitting: Family Medicine

## 2011-03-13 ENCOUNTER — Ambulatory Visit (INDEPENDENT_AMBULATORY_CARE_PROVIDER_SITE_OTHER): Payer: PRIVATE HEALTH INSURANCE | Admitting: Internal Medicine

## 2011-03-13 DIAGNOSIS — R195 Other fecal abnormalities: Secondary | ICD-10-CM

## 2011-03-23 ENCOUNTER — Encounter (HOSPITAL_BASED_OUTPATIENT_CLINIC_OR_DEPARTMENT_OTHER): Payer: PRIVATE HEALTH INSURANCE | Admitting: Internal Medicine

## 2011-03-23 ENCOUNTER — Other Ambulatory Visit (INDEPENDENT_AMBULATORY_CARE_PROVIDER_SITE_OTHER): Payer: Self-pay | Admitting: Internal Medicine

## 2011-03-23 ENCOUNTER — Ambulatory Visit (HOSPITAL_COMMUNITY)
Admission: RE | Admit: 2011-03-23 | Discharge: 2011-03-23 | Disposition: A | Discharge status: Home / Self Care | Payer: PRIVATE HEALTH INSURANCE | Source: Ambulatory Visit | Attending: Internal Medicine | Admitting: Internal Medicine

## 2011-03-23 DIAGNOSIS — Z79899 Other long term (current) drug therapy: Secondary | ICD-10-CM | POA: Insufficient documentation

## 2011-03-23 DIAGNOSIS — D126 Benign neoplasm of colon, unspecified: Secondary | ICD-10-CM | POA: Insufficient documentation

## 2011-03-23 DIAGNOSIS — R195 Other fecal abnormalities: Secondary | ICD-10-CM

## 2011-03-23 DIAGNOSIS — K921 Melena: Secondary | ICD-10-CM | POA: Insufficient documentation

## 2011-03-23 DIAGNOSIS — K5730 Diverticulosis of large intestine without perforation or abscess without bleeding: Secondary | ICD-10-CM | POA: Insufficient documentation

## 2011-03-23 DIAGNOSIS — I10 Essential (primary) hypertension: Secondary | ICD-10-CM | POA: Insufficient documentation

## 2011-03-26 NOTE — Consult Note (Addendum)
NAME:  Tracy Joseph, NICHTER              ACCOUNT NO.:  [id number]  MEDICAL RECORD NO.:  [id number]           PATIENT TYPE:  LOCATION:                                 FACILITY:  PHYSICIAN:  Lionel December, M.D.    DATE OF BIRTH:  03/01/1971  DATE OF CONSULTATION: DATE OF DISCHARGE:                                CONSULTATION   REASON FOR CONSULTATION:  This is an office visit for rectal bleeding and heme positive stools.  HISTORY OF PRESENT ILLNESS:  [name] is a 40 year old female referred to our office by Dr. Syliva Overman.  Tracy Joseph states she has been seen blood in her stool for approximately 9 months.  She says the blood is mixed in her stool.  She says she sees blood in her stool 90% of the time when she has a bowel movement.  She denies any abdominal pain.  There has been no rectal pain.  Her stools are normal size.  They are brown in color.  She denies any constipation and diarrhea.  She usually has a bowel movement daily.  She was positive in Dr. Anthony Sar office in February.  She says her appetite is good.  There has been no weight loss.  She actually has been gaining weight.  They are evaluating her thyroid.  Her H and H on February 13, 2011, 11.8 and 36.5.  Her MCV was 83.  She denies any abdominal pain.  No nausea or vomiting.  No hemorrhoids.  ALLERGIES:  There are no known allergies.  HOME MEDICATIONS: 1. Labetalol 200 mg a day. 2. Amlodipine 5 mg a day. 3. Benicar 40/25 mg a day. 4. Allergy Relief as needed.  SURGERIES:  She had right shoulder surgery for dislocated shoulder and she has had a tubal ligation.  MEDICAL HISTORY:  Hypertension.  FAMILY HISTORY:  There is no family history of colon cancer.  FAMILY HISTORY:  Her mother is alive, in good health.  Her father is deceased from an MVC.  No sisters.  One brother in good health.  She is married.  She works with a [employer].  She does not smoke, drink, or do drugs.  She has 1 child, in good  health.  Her last period was February 16, 2011, and she is due again next week.  OBJECTIVE:  HEENT:  She has natural teeth.  Her oral mucosa is moist. There are no lesions.  Her conjunctivae are pink.  Her sclerae are anicteric. NECK:  Her thyroid is enlarged.  There is fullness to her neck.  There is no cervical lymphadenopathy. LUNGS:  Clear. HEART:  Regular rate and rhythm. ABDOMEN:  Soft.  Bowel sounds are positive.  No masses and no tenderness.  Her stool was grossly guaiac positive, but it was brown in color. EXTREMITIES:  There is no edema to her extremities.  ASSESSMENT:  Tracy Joseph is a 40 year old female presenting today with hematochezia.  A colonic neoplasm needs to be ruled out.  The differential would include an arteriovenous malformation, polyp, or internal hemorrhoids.  RECOMMENDATIONS:  We will schedule a colonoscopy in the near future with Dr. Karilyn Cota.  The risk and benefits were reviewed with the patient and she is agreeable to proceed.    ______________________________ Dorene Ar, NP   ______________________________Najeeb Karilyn Cota, M.D.    TS/MEDQ  D:  03/13/2011  T:  03/14/2011  Job:  [id number]  cc:   Milus Mallick. Lodema Hong, M.D. Fax: [phone]  Electronically Signed by Dorene Ar PA on 03/22/2011 05:00:30 PM Electronically Signed by Lionel December M.D. on 03/26/2011 01:13:47 PM

## 2011-04-06 ENCOUNTER — Encounter: Payer: Self-pay | Admitting: Family Medicine

## 2011-04-07 ENCOUNTER — Encounter: Payer: Self-pay | Admitting: Family Medicine

## 2011-04-10 ENCOUNTER — Encounter: Payer: Self-pay | Admitting: Family Medicine

## 2011-04-10 ENCOUNTER — Ambulatory Visit: Payer: PRIVATE HEALTH INSURANCE | Admitting: Family Medicine

## 2011-05-05 NOTE — H&P (Signed)
NAME:  Tracy Joseph, Tracy Joseph                        ACCOUNT NO.:  [id number]   MEDICAL RECORD NO.:  [id number]                   PATIENT TYPE:  INP   LOCATION:  2113                                 FACILITY:  [facility]   PHYSICIAN:  Lurena Joiner L. Jolinda Croak, M.D.          DATE OF BIRTH:  1971/10/01   DATE OF ADMISSION:  08/24/2004  DATE OF DISCHARGE:                                HISTORY & PHYSICAL   ATTENDING PHYSICIAN:  Talmage Nap, M.D.   CHIEF COMPLAINT:  Elevated blood pressure.   HISTORY OF PRESENT ILLNESS:  The patient is a 40 year old female with an  intrauterine pregnancy at 20-0/7 weeks who [facility]  the day prior to this History and Physical with blood pressures up to the  230s to 130s range.  She was initially kept on OB Teaching Service but was  subsequently transferred to the MICU in the care of critical care medicine  because she is at an early stage of pregnancy and was considered to be  malignant hypertension and not preeclampsia.  Family Practice Teaching  Service was then asked to assume care.  Blood pressures have responded to  clonidine, labetalol, hydralazine, and nitroprusside.  She is currently  being weaned off the nitroprusside.  Of note, the patient has a history of  chronic hypertension as well as preeclampsia and eclampsia with an IUFD and  seizure in 1998.   REVIEW OF SYSTEMS:  Denies visual changes, headaches currently--she did have  one yesterday--numbness or tingling of the extremities, no current seizure.  Denies chest pain, shortness of breath, dysuria, vaginal discharge.  Does  complain of nausea and vomiting and mild bilateral lower extremity edema  which is better now.  Does note fetal activity.   PAST MEDICAL HISTORY:  Significant for chronic hypertension, a dislocated  shoulder with the seizure she had in 1998, and of note she had a renal  ultrasound in December of 1998 that showed no renal artery stenosis.   PAST  GYNECOLOGIC HISTORY:  She is a G 4, P 1-1-1-1.  G 1 was a spontaneous  vaginal delivery, full-term infant.  G 2 spontaneous abortion.  G 3 IUFD  with eclamptic seizure at 24 weeks.  G 4 is current pregnancy.  The patient  is Rh negative.   PAST SURGICAL HISTORY:  None.   No known drug allergies.   MEDICATIONS AT HOME:  Labetalol, prenatal vitamins, and a baby aspirin.   CURRENT MEDICATIONS:  Aldomet, labetalol, aspirin 81 mg, prenatal vitamins,  Colace, Zofran, hydralazine, clonidine, Avapro, and nitroprusside drip.  The  clonidine and Avapro have been discontinued.   FAMILY HISTORY:  Significant for chronic hypertension, pregnancy-induced  hypertension, and preeclampsia.   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  She lives in  [place] with her husband and 46 year old daughter.  Denies alcohol,  tobacco, or illicit drug use.   PHYSICAL EXAMINATION:  VITAL  SIGNS:  T-max and current 98.2, blood pressure  155/65 with a range of 125 to 185 over 50 to 85, mean arterial pressure 90  with a range from 85 to 11, heart rate 105, respiratory rate 24.  Saturating  94% on 2 liters nasal cannula.  Eight hour I's and O's:  308 in, 0 out.  GENERAL:  The patient is in no acute distress.  She is obese, pleasant.  HEENT:  Pupils equal, round, react to light.  Extraocular motions are  intact.  Oropharynx is without erythema or exudate.  Her right eye has some  scleral injection.  Her fundi are within normal limits bilaterally.  NECK:  She has no lymphadenopathy and no thyromegaly.  CARDIOVASCULAR:  She is slightly tachycardic with a regular rhythm.  No  murmur is noted.  She has 2+ distal pulses.  PULMONARY:  She has decreased breath sounds at the bases secondary to her  body habitus but is otherwise clear to auscultation bilaterally.  She has  fair air movement.  ABDOMEN:  Gravid, soft, nontender, nondistended with normoactive bowel  sounds.  EXTREMITIES:  Are without  edema.  NEUROLOGICAL:  Cranial nerves II-XII are grossly intact.  She has 5/5  strength bilateral upper and lower extremities, 2+ DTRs bilateral upper and  lower extremities, and no clonus.   An EKG showed no acute changes.  Chest x-ray from Sept 7th showed mild  cardiomegaly, mild peribronchial thickening.  Chest x-ray from September 8th  showed slightly increased bibasilar atelectasis and vascular congestion also  mild peribronchial thickening.   LABORATORY DATA:  CBC: White count 13.6, H&H 12.2/35.2, platelets 225.  Complete metabolic panel:  Sodium 129, potassium 4.2, chloride 102, bicarb  21, BUN 8, creatinine 1.2.  This is up from 0.9 versus up from 0.8  yesterday.  Calcium 9, total bilirubin 0.4, alk phos 67, SGOT slightly  elevated at 44, SGPT 32, total protein 6.1, albumin 2.8.  LDH 189.  Cardiac  enzymes:  CK went from 215 to 197 to 177, CK-MB 3.1 to 2.7 to 2.2, troponin-  I 0.04 to 0.01, to 0.03. A BNP 49.  Uric acid 5.2.   ASSESSMENT AND PLAN:  1.  This is a 40 year old female with hypertensive crisis/urgency.      According to the patient, she did not have visual changes or headache      and her headache was after she was admitted to the hospital.  She had no      evidence of encephalopathy or impending eclampsia.  We will continue her      current medications of Aldomet, labetalol, hydrochlorothiazide, and      hydralazine.  Critical Care Medicine has written to begin weaning her      nitroprusside and we will continue this wean.  Her only current evidence      of an organ damage is her elevated creatinine.  I am unclear if this is      secondary to the blood pressure.  It could also be secondary to decrease      in fluids.  We will check a BMET in the a.m.  We will discuss with the      team a transfer to a regular bed or back to [facility] when she is off her drips. 2.  Intrauterine pregnancy at 20-0/7 weeks.  Continue prenatal vitamins and       q.8 h. fetal heart tone Doppler monitoring.  3.  Right conjunctival injection.  This is secondary to a contact wound      problem the patient was having.  We will keep an eye on this.  4.  Nasal congestion.  This was reported to Critical Care Medicine and they      will start Rhinocort.      Ursula Beath, MD                     Randon Goldsmith. Jolinda Croak, M.D.    JT/MEDQ  D:  08/25/2004  T:  08/25/2004  Job:  [id number]

## 2011-05-05 NOTE — Discharge Summary (Signed)
NAME:  Tracy Joseph, Tracy Joseph                        ACCOUNT NO.:  [id number]   MEDICAL RECORD NO.:  [id number]                   PATIENT TYPE:  INP   LOCATION:  9373                                 [facility]   PHYSICIAN:  Sharin Grave, MD               DATE OF BIRTH:  1971-08-20   DATE OF ADMISSION:  08/24/2004  DATE OF DISCHARGE:  08/24/2004                                 DISCHARGE SUMMARY   DISCHARGE DIAGNOSES:  1.  Hypertensive urgency.  2.  Intrauterine pregnancy 21 weeks at the time of discharge.  3.  Nasal congestion.   DISCHARGE MEDICATIONS:  1.  Aspirin 81 mg daily.  2.  Rhinocort one spray in each nostril daily and discontinue after three      weeks.  3.  Labetalol 600 mg b.i.d.  4.  Methyldopa 750 mg t.i.d.  5.  Nifedipine (Procardia XL) 90 mg t.i.d.  6.  Prenatal vitamins one tablet daily.   PROCEDURE:  1.  Patient had an EKG on September 7 that showed no acute changes.  2.  Had an echocardiogram on September 12 that showed overall normal left      ventricular systolic function.  Left ventricular ejection fraction      between 55% and 65%.  No left ventricular regional wall motion      abnormalities.  Left ventricular wall thickness moderately increased.      Left atrium mildly dilated.  3.  Patient also had a PICC line placed on August 26, 2004.   BRIEF HISTORY OF PRESENT ILLNESS:  Tracy Joseph is a 40 year old female G4,  P1-1-1-1.  G1 was a spontaneous vaginal delivery of a full-term infant.  G2  was a spontaneous abortion.  G3 was an intrauterine fetal demise with  eclamptic seizure at 24 weeks.  Currently pregnant at [redacted] weeks at the time  of admission.  This is a patient who presented at [facility] on  September 7 with blood pressures systolic between 161-096 and a diastolic  between 104-129.  She was admitted on OB teaching service at [facility] and subsequently transferred to the [facility] Intensive Care Unit  considered to have a  malignant hypertension and not preeclampsia.  Also with  presumptive encephalopathy.   REVIEW OF SYSTEMS:  She denied visual changes or headache, numbness or  tingling of extremities, and no seizures.  She denied chest pain, shortness  of breath, dysuria, or vaginal discharge.  She did complain of nausea and  vomiting and mild bilateral lower extremity edema and also she had positive  fetal movements.   PHYSICAL EXAMINATION:  VITAL SIGNS:  On admission patient was found to have  a blood pressure of 165/65 with a range of 125-185 systolic over a 50-85  diastolic pressure, heart rate of 105 and respiratory rate of 24.  She had  an O2 saturation of 94% on 2 L of nasal oxygen through  nasal cannula.  GENERAL:  She was in no acute distress.  HEART:  She was found to be slightly tachycardic with a regular rhythm.  NEUROLOGIC:  Cranial nerves II-XII were grossly intact.  She had 5/5  strength bilateral upper and lower extremities and 2+ deep tendon reflexes  bilateral upper and lower extremities.  No clonus.   She had an EKG that showed no acute changes and chest x-ray that showed mild  cardiomegaly and mild peribronchial thickening.  Her admission laboratories  showed CBC:  White count 13.6, hemoglobin 12.2, hematocrit 35.2, platelets  225.  Complete metabolic panel showed sodium 129, potassium 4.2, chloride  102, bicarbonate 21, urea 8, creatinine 1.2.  Calcium 9, total bilirubin  0.4, alkaline phosphatase 67, SGOT 44, SGPT 32, total protein 6.1, albumin  2.8, LDH 189.  Cardiac enzymes:  CK 215, CK-MB 2.1, troponin I 0.04.  A BNP  __________  and uric acid 5.2.   HOSPITAL COURSE:  #1 - HYPERTENSIVE URGENCY:  On admission patient had no  evidence of encephalopathy or impending eclampsia.  We continued her  medications with Aldomet, labetalol, hydrochlorothiazide, and hydralazine  and started to wean her nitroprusside drip.  Critical care medicine was  consulted and the goal was to increase  p.o. medications and to stabilize the  blood pressure to keep the systolic below 200 and the diastolic blood  pressure below 110 and wean the Nipride drip.  p.o. labetalol was initially  200 mg p.o. b.i.d., then was increased on September 9 to 400 mg b.i.d.  We  also, on September 29, increased hydralazine p.o. to 25 mg q.i.d. and  continued to wean the Nipride drip.  Also, patient had a magnesium sulfate 2  g IV per one.  We followed patient with daily [facility] laboratories that  comprehend CMET, complete metabolic panel, CBC, LDH, and uric acid.  On  September 9 blood pressure levels still remained systolic between 621-308  and diastolic between 65-784 with a heart rate of 95-112.  Cross coverage  team decided to increase the Nipride drip over the weekend to control the  blood pressure levels.  On September 11 the management team decided to start  weaning up again the Nipride drip and we increased all the oral medications.  Aldomet was increased to 500 mg p.o. t.i.d. and also had another magnesium  sulfate dose of 2 g IV per one.  September 12 we added nicardipine 30 mg  p.o. t.i.d. and aspirin 81 mg p.o. t.i.d.  Blood pressure levels on  September 11 were systolic between 696-295 and diastolic between 28-41 with  a heart rate of 89-108.  On September 12 blood pressure levels were reaching  systolic 140-190 and diastolic between 32-440 with a heart rate of 93-105.  We decided to aggressive continue to wean the Nipride drip and we started a  labetalol drip to keep systolic blood pressure below 102 and diastolic blood  pressure below 110.  We increased hydralazine to 50 mg p.o. q.6h.  Aldomet  was increased to 750 p.o. q.8h. and we also increased labetalol p.o. to 600  mg b.i.d.  During admission date patient usually had no complaints, no  headaches, no chest pain, no shortness of breath.  She just complained of nausea on September 12, September 13 that was treated with Phenergan 12.5 mg  p.o.   September 13 her systolic blood pressure was between 122-168 and the  diastolic between 72-53 with a heart rate of 85-101.  Nipride drip  was  completely off at 3 a.m. on September 13.  On September 13 also telemetry  was discontinued and patient was sent to a regular bed.  On September 14  blood pressure was systolic between 366-440 and diastolic between 34-74 with  a heart rate of 82-91.  Since we wanted to conserve a good utero-placental  flow, we decided to discontinue hydrochlorothiazide to keep the diastolic  pressure between 85-95, as I said, to allow for a good placental perfusion.  Also, we decided to discontinue nicardipine and change to Procardia XL 90 mg  p.o. daily.  By September 15 vitals were temperature 98.1, pulse 83,  respirations 18, systolic blood pressure 154/95 with O2 saturations of 97%  on room air.  Patient had no complaints, was feeling well, and no complaint  of headache or no chest pain and no shortness of breath.  Was discharged  home in stable condition to follow up with an appointment at [facility] next Wednesday, September 21 at 10:45.  Also had an appointment next  Monday, September 19 with Dr. Langley Gauss for a blood pressure check at  11:15 a.m.   DISCHARGE LABORATORIES:  During admission on September 8 patient had a total  24-hour urine protein with a result of 581.  Had a __________  cyanide level  in serum of 10 and LDL on September 15 was 157.  Uric acid on September 15  was 4.8.  CBC was white blood cell count 12.6, hemoglobin 11.8, hematocrit  34.1, platelets 251.  A metabolic panel showed sodium 134, potassium 3.7,  chloride 105, CO2 24, urea 8, creatinine 0.8, glucose 84.  Calcium 8.9,  total bilirubin 0.4, alkaline phosphatase 71, SGOT 35, SGPT 23, total  protein 6, albumin 2.5.   Patient was told about the seriousness to having these blood pressure values  and to have proteinuria and this early pregnancy and due to her past history   of eclampsia and fetal demise the possibilities of a bad outcome with this  current pregnancy.  She also had studies in 1998 during her last pregnancy  where she had intrauterine fetal demise.  She was studied at length and was  shown to not have any renal abnormalities, no stenosis of the renal  arteries.  Also, she had normal values for metanephrines like homovanillic  acid and vanillylmandelic acid and we decided not to repeat those tests.   #2 - INTRAUTERINE PREGNANCY:  21 weeks at the time of discharge.  Patient  had an OB ultrasound on August 23 that showed single living intrauterine  fetus in cephalic presentation, normal amniotic fluid volume, fetal anatomy  unremarkable, within normal limits.  During the time of admission we  continued treatment with prenatal vitamins at one tablet every day and to  encourage left side decubitus.  Also, patient had fetal heart tones, fetal  Dopplers q.8h.  #3 - NASAL CONGESTION:  Patient was treated with Rhinocort one spray in each  nostril every day with improvement of symptoms at the time of discharge.   FOLLOWUP:  Patient has an appointment at the [facility] on Wednesday, September 21 at 10:45 with Dr. Gavin Potters.  Also has an  appointment Dr. Lisette Grinder, OB/GYN in [place] next Monday, September 19 for  blood pressure check.   SPECIAL INSTRUCTIONS:  There is still room for critical care.  Recommendations are that there is still room for increased labetalol to  t.i.d. and increased methyldopa to q.i.d.  if there is no good control of  patient's blood pressure levels.       AM/MEDQ  D:  09/04/2004  T:  09/05/2004  Job:  [id number]   cc:   Conni Elliot, M.D.  [address]  [place]  [place] [address]  Fax: [phone]   Langley Gauss, M.D.  [address]  [place]  [place] [address]  Fax: [phone]

## 2011-05-05 NOTE — Group Therapy Note (Signed)
NAME:  Tracy Joseph, Tracy Joseph NO.:  [id number]   MEDICAL RECORD NO.:  [id number]          PATIENT TYPE:  WOC   LOCATION:  [facility]                   FACILITY[facility]  [facility]   PHYSICIAN:  Elsie Lincoln, MD      DATE OF BIRTH:  1971/11/25   DATE OF SERVICE:  10/18/2004                                    CLINIC NOTE   REASON FOR VISIT:  The patient is a 40 year old para 1-2-1-1 status post  NSVD of a nonviable infant on September 16, 2004.  The patient had severe  preeclampsia at 22 weeks estimated gestational age.  She also had HELLP.  The patient had a laborious induction requiring multiple Cytotec and  Pitocin.  The patient also received steroids for her HELLP.  The patient had  an IFD during the delivery process and then delivered the fetus.  Her  postpartum course was uncomplicated.  She was placed on lisinopril 10 mg  p.o. daily, labetalol 200 mg p.o. b.i.d. and hydrochlorothiazide 25 mg p.o.  q.h.s.  She is followed by Dr. Lodema Hong, an internist in [place].  Today,  she desires a workup for a BTL.  She underwent a similar problem with her  second pregnancy ending up with a nonviable infant at 24 weeks.  She does  have 1 living child who is 67 years old and she says this is sufficient for  her.  The patient was explained the procedure and she accepts the risks of  bleeding, infection, and damage to internal organs such as the bowel,  bladder, uterus, ovaries, and major blood vessels.  The patient also  understands that the risks of pregnancy is 3 to 4 out of a 1000 and if  pregnancy does occur there is increased risk of an ectopic pregnancy.  The  patient agrees to seek care if she does skip a period or have a positive  pregnancy test.   PAST MEDICAL HISTORY:  Hypertension.   PAST SURGICAL HISTORY:  Denies.   GYNECOLOGICAL HISTORY:  Denies abnormal Pap smears, ovarian cysts, fibroid  tumors, sexually transmitted diseases.  She has had the complicated problems  with  preeclampsia as described above.   ALLERGIES:  No known drug allergies.Marland Kitchen   MEDICATIONS:  Lisinopril, labetalol, and hydrochlorothiazide.   FAMILY HISTORY:  Denies ovarian, endometrial, cervical, or colon cancer.   REVIEW OF SYSTEMS:  No chest pain, shortness of breath, nausea, vomiting,  diarrhea, change in urinary habits or dizziness.   PHYSICAL EXAMINATION:  VITAL SIGNS:  Temperature 97.1, pulse 89, blood  pressure 117/75, weight 215.2, height 5 feet 6 inches.  GENERAL:  Well-nourished, well-developed, in no apparent distress.  HEENT:  Normocephalic, atraumatic.  NECK:  Supple, no masses, no thyromegaly.  LUNGS:  Clear to auscultation bilaterally.  HEART:  Regular rate and rhythm.  ABDOMEN:  Soft, obese, nontender, nondistended.  BREASTS:  Supple.  No masses or skin changes.  PELVIC:  External genitalia Tanner V.  Vagina pink, no lesions or discharge  or blood.  Cervix closed, nontender.  Uterus small, mobile, nontender.  Adnexa nontender, no masses.   ASSESSMENT:  A 40 year old female for  bilateral tubal ligation.   PLAN:  1.  Will do laparoscopically.  The patient understands risks as described in      detail above.  2.  Preop labs include CBC, __________ , pregnancy test and CMP.  3.  Need cardiac clearance from Dr. Lodema Hong in [place].  4.  Will shoot to do the BTL in one week before the patient returns to work.  5.  The patient return to Dr. Lisette Grinder, an OB/GYN in [place] for follow up      of Pap smear and other health care management.      KL/MEDQ  D:  10/18/2004  T:  10/18/2004  Job:  [id number]

## 2011-05-05 NOTE — Consult Note (Signed)
NAME:  Tracy Joseph, Tracy Joseph                        ACCOUNT NO.:  [id number]   MEDICAL RECORD NO.:  [id number]                   PATIENT TYPE:  INP   LOCATION:  2113                                 FACILITY:  [facility]   PHYSICIAN:  Shan Levans, M.D. [facility]            DATE OF BIRTH:  January 22, 1971   DATE OF CONSULTATION:  DATE OF DISCHARGE:                                   CONSULTATION   See obstetrics noted from the [employer]   HISTORY:  This is a 40 year old, gravida 4, para 4 whose had two previous  miscarriages, one previously fullterm child and now a current 19 week  pregnancy with previous preeclampsia and seizure with previous pregnancies  now with hypertensives crisis. She has had poorly controlled hypertension  all summer as this pregnancy has been involving multiple OB clinic visits  and show blood pressures anywhere from the 170 to 180/110 range. She is on  labetalol 300 mg b.i.d. only at home and states she has been compliant with  these medicines. She is admitted today via the OB clinic with nausea,  vomiting, headaches and confusion, blurred vision and a blood pressure of  230/135 in a hypertensive crisis. She was admitted initially to the AICU at  [facility] but not the obstetrics service asking for transfer to a  medical ICU given the fact that she is stable obstetrically, she is not  having evidence of preeclampsia nor is she having evidence for preterm  labor.  However, because of progressive encephalopathy and confusion and  headache, we are asked to see the patient in transfer.   PAST MEDICAL HISTORY:  Medical history of hypertension only.  No history of  MI, asthma, peptic ulcer disease, seizures or other abnormalities.   MEDICATIONS PRIOR TO ADMISSION:  1.  Labetalol 300 mg b.i.d.  2.  Aspirin 81 mg daily.   ALLERGIES:  None.   PAST SURGICAL HISTORY:  None.   SOCIAL HISTORY AND REVIEW OF SYSTEMS:  Otherwise noncontributory.   PHYSICAL EXAMINATION:   VITAL SIGNS:  Blood pressure 170/107 initially now  159/104, heart rate 101, temperature 97.3, sat 97% room air.  GENERAL:  This is an obese African-American female in no acute distress.  HEENT:  No jugular venous distention, oropharynx clear, eye grounds are not  examined. Extraocular movements are equal and intact.  There is no scleral  icterus or injection.  CHEST:  Clear without evidence of wheeze or rhonchi.  CARDIAC:  Showed tachycardia without S3, normal S1, S2. S4 gallop is noted.  No murmur.  ABDOMEN:  Obese, [redacted] week pregnant uterus noted, no vaginal discharge.  EXTREMITIES:  Show no clubbing, edema or venous disease.  SKIN:  Clear.  NEUROLOGIC:  The patient is slightly confused but is oriented x3.   LABORATORY DATA:  Sodium 136, potassium 4.0, chloride 109, CO2 22, BUN 9,  creatinine 0.8, blood sugars 80.  Hemoglobin 12.5, white count 11,000,  troponin  I of 0.04, LDH 193.  Urinalysis shows proteinuria and hematuria.   EKG no acute change. There is no chest x-ray available yet.   IMPRESSION:  Hypertensive crisis with ongoing encephalopathy in a [redacted] week  pregnant patient without evidence of eclampsia or seizure.   RECOMMENDATIONS:  Transfer to medical ICU at [facility].  Continue IV  labetalol, titrate for pressure range 140 to 160 systolic, give IV  hydralazine on a scheduled basis, begin clonidine topically, give IV Lasix.  Place arterial line, check BNP level, check chest x-ray, give oxygen  supplementation.  I will refer to medical teaching service B in the morning  as they are capped tonight and cannot accept the admission tonight.  In the  meantime will admit to the Critical Care Service at [facility].                                               Shan Levans, M.D. [facility]    PW/MEDQ  D:  08/24/2004  T:  08/25/2004  Job:  [id number]   cc:   Javier Glazier. Okey Dupre, M.D.  Fax: [phone]

## 2011-05-05 NOTE — Discharge Summary (Signed)
NAMEMarland Kitchen  Tracy Joseph, Tracy Joseph              ACCOUNT NO.:  [id number]   MEDICAL RECORD NO.:  [id number]          PATIENT TYPE:  WOC   LOCATION:  WOC                            FACILITY:   PHYSICIAN:  Lesly Dukes, M.D. DATE OF BIRTH:  April 03, 1971   DATE OF ADMISSION:  09/14/2004  DATE OF DISCHARGE:  09/19/2004                                 DISCHARGE SUMMARY   ADMISSION DIAGNOSIS:  1.  The patient was a 40 year old G12, P1-1-1-1 with a last menstrual period      of April 05, 2004 with an estimated date of conception of January 10, 2004 at 23-1/7 weeks dated by six weeks ultrasound presenting from the      [facility] with malignant hypertension, epigastric pain, and      nausea and vomiting.  2.  Previable fetus.   DISCHARGE DIAGNOSIS:  The patient is a G64, P15-86-22-85, 40 year old female  status post delivery of a nonviable fetus secondary to hemolysis, elevated  liver enzymes, and low platelets syndrome.   DISCHARGE MEDICATIONS:  1.  Ibuprofen 600 mg by mouth q.6h. for pain as needed.  2.  Lisinopril 10 mg by mouth p.o. daily.  3.  Labetalol 200 mg by mouth b.i.d.  4.  Hydrochlorothiazide 25 mg p.o. daily.  5.  Ortho-Novum 1/35 one tablet p.o. daily as instructed starting on October 03, 2004.   ADMISSION HISTORY:  The patient is a 40 year old, G4, P1-1-1-1 with last  menstrual period of April 05, 2004 who presented from the [facility]  with malignant hypertension and epigastric pain, nausea and vomiting.  She  had no headache.  No visual changes.  She had an obstetrical history  significant for eclampsia with seizure and a fetal demise at 24 weeks.  She  had laboratory values consistent with HELLP.  UA greater than 300 protein,  platelets of 90, AST of 165, ALT 95, LDH 647.  Uric acid 5.4.  Blood  pressure of 160/110 on admission.   HOSPITAL COURSE:  The patient was admitted with low magnesium.  Given the  patient's clinical scenario, delivery was indicated.   The patient's fetus  was nonviable secondary to dates.  The patient had an un augmented vaginal  delivery on September 16, 2004.  After delivery, the patient's magnesium was  eventually discontinued and an ACE inhibitor was added for blood pressure  control.  At the time of discharge, the patient's platelets had increased  and were stable at 72.  The patient's AST and ALT had trended downward.  She  was in no acute distress and having only very minimal spotting per vagina.  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  The  patient's chronic hypertension was being  controlled with several  medications.  The patient had birth control arranged with Ortho-Novum  starting two weeks after discharge along with a scheduled appointment in  four weeks for a bilateral tubal ligation.  The patient also had  instructions to followup with her PCP in  in six weeks.   CONDITION  ON DISCHARGE:  Stable.   DISCHARGE INSTRUCTIONS:  The patient was instructed for her medication  regimen.  The patient was also instructed to followup as listed previously.      GSD/MEDQ  D:  09/19/2004  T:  09/19/2004  Job:  [id number]   cc:   [facility]   Primary Care Physician in [place]

## 2011-05-05 NOTE — Op Note (Signed)
NAME:  Tracy Joseph, Tracy Joseph NO.:  [id number]   MEDICAL RECORD NO.:  [id number]          PATIENT TYPE:  WOC   LOCATION:  WOC                          FACILITY:  [facility]   PHYSICIAN:  Lesly Dukes, M.D. DATE OF BIRTH:  10/07/1971   DATE OF PROCEDURE:  10/24/2004  DATE OF DISCHARGE:                                 OPERATIVE REPORT   PREOPERATIVE DIAGNOSIS:  A 40 year old female with history of severe  preeclampsia in second trimester leading to fetal demise x2, who desires  permanent sterilization.   POSTOPERATIVE DIAGNOSES:  18.  A 40 year old female with history of severe preeclampsia in second      trimester leading to fetal demise x2, who desires permanent      sterilization.  2.  Right ovarian cyst.   PROCEDURES:  1.  Laparoscopic bilateral tubal ligation.  2.  Right ovarian cyst aspiration.   SURGEON:  Lesly Dukes, M.D.   ANESTHESIA:  General.   ESTIMATED BLOOD LOSS:  Minimal.   COMPLICATIONS:  None.   PATHOLOGY:  Right ovarian cyst fluid.   FINDINGS:  Normal-appearing uterus.  Normal-appearing bilateral fallopian  tubes.  Normal-appearing left ovary.  Approximately 4 x 4 right ovarian cyst  with clear fluid aspirated.  Normal-appearing appendix and normal-appearing  liver.   PROCEDURE:  After informed consent was obtained, the patient was taken to  the operating room, where general anesthesia was induced.  The patient was  in dorsal lithotomy position, was prepared and draped in normal sterile  fashion.  A Foley catheter was placed into the bladder and a Hulka clip was  placed on the cervix to aid in uterine mobilization.  An umbilical skin  incision was made with a scalpel and carried down to the fascia.  The fascia  was elevated and entered sharply with the Mayo scissors.  This incision was  extended to approximately 1.2 cm.  The peritoneum was entered bluntly.  A  Hasson trocar was introduced into the abdomen and the abdomen was  insufflated with CO2 to a pressure of 15.  The laparoscope was introduced  into the abdomen.  The operative laparoscope was introduced into the abdomen  and a blunt probe was inserted.  A survey of the abdominal contents was  performed with the above findings.  A Filshie clip was then used to ligate  the left fallopian tube.  The right fallopian tube could not be elevated  secondary to the ovarian cyst and the ovary being pulled down to the  posterior cul-de-sac.  At this time a second port was then made.  The skin  was incised approximately 0.5 cm, 2 cm above the pubic symphysis in the  midline.  Under direct visualization a blunt 5 mm disposable trocar was  introduced into the abdomen.  A blunt probe was introduced into the abdomen  and the right ovary was brought anteriorly.  The cyst was aspirated using  the laparoscopic aspirator.  Approximately 25 mL of straw-colored fluid was  sent to pathology.  There was no bleeding from the site of puncture on the  ovary.  The right fallopian tube was then ligated with the Filshie clip.  There was no bleeding from either fallopian tube or the ovary at the end of  the procedure.  The pneumoperitoneum was released and all instruments were  removed from the abdomen with the umbilical trocar taken out with the  laparoscope in place to avoid risk of herniation.  The fascia was closed  with 0 Vicryl at the umbilicus.  The skin was closed with 4-0 Vicryl in  subcuticular fashion at the umbilicus.  The skin was closed suprapubically  with Dermabond.  The patient tolerated the procedure well.  The sponge, lap,  instrument, and needle count were correct x2, and the patient went to the  recovery room in stable condition.      KHL/MEDQ  D:  10/24/2004  T:  10/24/2004  Job:  [id number]

## 2011-09-22 ENCOUNTER — Other Ambulatory Visit: Payer: Self-pay | Admitting: Family Medicine

## 2012-01-29 ENCOUNTER — Other Ambulatory Visit: Payer: Self-pay | Admitting: Family Medicine

## 2012-02-02 ENCOUNTER — Other Ambulatory Visit: Payer: Self-pay | Admitting: Family Medicine

## 2012-02-02 ENCOUNTER — Other Ambulatory Visit: Payer: Self-pay

## 2012-02-02 MED ORDER — LABETALOL HCL 200 MG PO TABS: 200.0000 mg | ORAL_TABLET | Freq: Every day | ORAL | Status: DC

## 2012-08-30 ENCOUNTER — Other Ambulatory Visit: Payer: Self-pay | Admitting: Family Medicine

## 2012-12-02 ENCOUNTER — Telehealth: Payer: Self-pay | Admitting: Family Medicine

## 2012-12-02 NOTE — Telephone Encounter (Signed)
pls send in 1 month only of the BP med requested, Explain to her that past that time I will NOT be able to refill without oV

## 2012-12-02 NOTE — Telephone Encounter (Signed)
Pt requested refill on BP med. I denied because hasn't been here in a LONG time. Patient called back stating she wouldn't have insurance until Jan 2014. Ok to notify pt she will need to wait until she comes in?

## 2012-12-02 NOTE — Telephone Encounter (Signed)
Patient needs an appt. Hasn't been here in years

## 2012-12-03 MED ORDER — LABETALOL HCL 200 MG PO TABS: 200.0000 mg | ORAL_TABLET | Freq: Every day | ORAL | Status: DC

## 2012-12-03 MED ORDER — OLMESARTAN MEDOXOMIL-HCTZ 40-25 MG PO TABS: 1.0000 | ORAL_TABLET | Freq: Every day | ORAL | Status: DC

## 2012-12-03 NOTE — Telephone Encounter (Signed)
Patient aware and 30 days sent to [facility]

## 2013-01-27 ENCOUNTER — Other Ambulatory Visit: Payer: Self-pay | Admitting: Family Medicine

## 2013-07-05 ENCOUNTER — Encounter (HOSPITAL_COMMUNITY): Payer: Self-pay | Admitting: *Deleted

## 2013-07-05 ENCOUNTER — Emergency Department (HOSPITAL_COMMUNITY)
Admission: EM | Admit: 2013-07-05 | Discharge: 2013-07-05 | Disposition: A | Discharge status: Home / Self Care | Payer: Self-pay | Attending: Emergency Medicine | Admitting: Emergency Medicine

## 2013-07-05 DIAGNOSIS — Z79899 Other long term (current) drug therapy: Secondary | ICD-10-CM | POA: Insufficient documentation

## 2013-07-05 DIAGNOSIS — Y9389 Activity, other specified: Secondary | ICD-10-CM | POA: Insufficient documentation

## 2013-07-05 DIAGNOSIS — W268XXA Contact with other sharp object(s), not elsewhere classified, initial encounter: Secondary | ICD-10-CM | POA: Insufficient documentation

## 2013-07-05 DIAGNOSIS — E669 Obesity, unspecified: Secondary | ICD-10-CM | POA: Insufficient documentation

## 2013-07-05 DIAGNOSIS — I10 Essential (primary) hypertension: Secondary | ICD-10-CM | POA: Insufficient documentation

## 2013-07-05 DIAGNOSIS — S61209A Unspecified open wound of unspecified finger without damage to nail, initial encounter: Secondary | ICD-10-CM | POA: Insufficient documentation

## 2013-07-05 DIAGNOSIS — Y92009 Unspecified place in unspecified non-institutional (private) residence as the place of occurrence of the external cause: Secondary | ICD-10-CM | POA: Insufficient documentation

## 2013-07-05 DIAGNOSIS — S61210A Laceration without foreign body of right index finger without damage to nail, initial encounter: Secondary | ICD-10-CM

## 2013-07-05 MED ORDER — LIDOCAINE HCL (PF) 1 % IJ SOLN
INTRAMUSCULAR | Status: AC
  Administered 2013-07-05: 18:00:00
  Filled 2013-07-05: qty 5

## 2013-07-05 MED ORDER — BACITRACIN-NEOMYCIN-POLYMYXIN 400-5-5000 EX OINT
TOPICAL_OINTMENT | Freq: Once | CUTANEOUS | Status: AC
  Administered 2013-07-05: 1 via TOPICAL
  Filled 2013-07-05: qty 1

## 2013-07-05 NOTE — ED Notes (Signed)
Pt states last tetanus shot was about 5 years ago.

## 2013-07-05 NOTE — ED Notes (Signed)
Pt states that she has not taken her blood pressure medication in a week because she is out of the prescription.

## 2013-07-05 NOTE — ED Notes (Signed)
Pt was opening a can of dog food, did not get all the top off, cut her right middle finger on top of can, bleeding controlled at triage, last tetanus was 5 years ago.

## 2013-07-05 NOTE — ED Provider Notes (Signed)
History    CSN: [id number] Arrival date & time 07/05/13  1619  First MD Initiated Contact with Patient 07/05/13 1635     Chief Complaint  Patient presents with  . Laceration   (Consider location/radiation/quality/duration/timing/severity/associated sxs/prior Treatment) Patient is a 42 y.o. female presenting with skin laceration. The history is provided by the patient.  Laceration Location:  Hand Hand laceration location:  R finger Bleeding: controlled   Laceration mechanism:  Metal edge Foreign body present:  No foreign bodies Relieved by:  Nothing Worsened by:  Movement Ineffective treatments:  None tried Tetanus status:  Up to date  Tracy Joseph is a 42 y.o. female who presents to the ED with a laceration to the right hand middle finger and ring finger. She is right hand dominant. She states that she was opening a can of dog food and was distracted by a rabbit in the yard and her hand slipped and slid across the top of the can causing the lacerations. Bleeding is controlled. She denies any other injuries.   Past Medical History  Diagnosis Date  . Hypertension   . Obesity    Past Surgical History  Procedure Laterality Date  . Tubal ligation     No family history on file. History  Substance Use Topics  . Smoking status: Never Smoker   . Smokeless tobacco: Not on file  . Alcohol Use: No   OB History   Grav Para Term Preterm Abortions TAB SAB Ect Mult Living                 Review of Systems  Constitutional: Negative for fever and chills.  HENT: Negative for neck pain.   Gastrointestinal: Negative for nausea and vomiting.  Musculoskeletal:       Right middle and ring finger lacerations  Skin: Positive for wound.  Neurological: Negative for numbness.  Psychiatric/Behavioral: The patient is not nervous/anxious.     Allergies  Dust mite extract and Pollen extract  Home Medications   Current Outpatient Rx  Name  Route  Sig  Dispense  Refill  .  amLODipine (NORVASC) 10 MG tablet   Oral   Take 0.5 tablets (5 mg total) by mouth daily. Take one tablet by mouth once a day   30 tablet   3   . amLODipine (NORVASC) 5 MG tablet      TAKE 1 TABLET BY MOUTH ONCE A DAY   30 tablet   3   . labetalol (NORMODYNE) 200 MG tablet   Oral   Take 1 tablet (200 mg total) by mouth daily.   30 tablet   0   . loratadine (CLARITIN) 10 MG tablet   Oral   Take 10 mg by mouth.           . olmesartan-hydrochlorothiazide (BENICAR HCT) 40-25 MG per tablet   Oral   Take 1 tablet by mouth daily. Take one tablet by mouth once a day   30 tablet   0    BP 187/106  Pulse 82  Temp(Src) 98.3 F (36.8 C) (Oral)  Resp 18  Ht 5\' 5"  (1.651 m)  Wt 215 lb (97.523 kg)  BMI 35.78 kg/m2  SpO2 100%  LMP 07/05/2013 Physical Exam  Nursing note and vitals reviewed. Constitutional: She is oriented to person, place, and time. She appears well-developed and well-nourished. No distress.  HENT:  Head: Normocephalic.  Eyes: EOM are normal.  Neck: Neck supple.  Cardiovascular: Normal rate.   Pulmonary/Chest: Effort  normal.  Musculoskeletal:       Right hand: She exhibits tenderness and laceration. She exhibits normal range of motion, normal capillary refill and no deformity. Normal sensation noted. Normal strength noted.       Hands: Laceration of middle finger and ring finger.  Neurological: She is alert and oriented to person, place, and time. No cranial nerve deficit.  Skin: Skin is warm and dry.  Psychiatric: She has a normal mood and affect. Her behavior is normal.    ED Course  Procedures  LACERATION REPAIR Performed by: Dermot Gremillion Authorized by: Siedah Sedor Consent: Verbal consent obtained. Risks and benefits: risks, benefits and alternatives were discussed Consent given by: patient Patient identity confirmed: provided demographic data Prepped and Draped in normal sterile fashion Wound explored  Laceration Location: right middle and ring  finger  Laceration Length: 1.2 cm  No Foreign Bodies seen or palpated  Anesthesia: local infiltration  Local anesthetic: lidocaine 1% without epinephrine  Anesthetic total: 1 ml  Irrigation method: syringe Amount of cleaning: standard  Skin closure: 5-0 Prolene middle finger  Number of sutures: 5  Technique: interrupted  On the ring finger the wound is superficial and Dermabond was applied after cleaning the wound  Patient tolerance: Patient tolerated the procedure well with no immediate complications.  MDM  42 y.o. female with lacerations to the middle and ring finger of the right hand. Patient up to date on tetanus. She will follow up with her doctor in 7 days for suture removal. She will return here for any problems.  [facility] [name], [place] 07/05/13 1755

## 2013-07-10 NOTE — ED Provider Notes (Signed)
Medical screening examination/treatment/procedure(s) were performed by non-physician practitioner and as supervising physician I was immediately available for consultation/collaboration.  Raeford Razor, MD 07/10/13 2234

## 2013-10-14 ENCOUNTER — Encounter (INDEPENDENT_AMBULATORY_CARE_PROVIDER_SITE_OTHER): Payer: Self-pay

## 2013-10-14 ENCOUNTER — Encounter: Payer: Self-pay | Admitting: Family Medicine

## 2013-10-14 ENCOUNTER — Ambulatory Visit (INDEPENDENT_AMBULATORY_CARE_PROVIDER_SITE_OTHER): Payer: PRIVATE HEALTH INSURANCE | Admitting: Family Medicine

## 2013-10-14 VITALS — BP 162/92 | HR 82 | Resp 18 | Ht 65.25 in | Wt 226.0 lb

## 2013-10-14 DIAGNOSIS — E049 Nontoxic goiter, unspecified: Secondary | ICD-10-CM

## 2013-10-14 DIAGNOSIS — R7301 Impaired fasting glucose: Secondary | ICD-10-CM

## 2013-10-14 DIAGNOSIS — D126 Benign neoplasm of colon, unspecified: Secondary | ICD-10-CM

## 2013-10-14 DIAGNOSIS — I10 Essential (primary) hypertension: Secondary | ICD-10-CM

## 2013-10-14 DIAGNOSIS — E669 Obesity, unspecified: Secondary | ICD-10-CM

## 2013-10-14 DIAGNOSIS — E0590 Thyrotoxicosis, unspecified without thyrotoxic crisis or storm: Secondary | ICD-10-CM

## 2013-10-14 MED ORDER — TRIAMTERENE-HCTZ 75-50 MG PO TABS: 1.0000 | ORAL_TABLET | Freq: Every day | ORAL | Status: DC

## 2013-10-14 NOTE — Progress Notes (Signed)
  Subjective:    Patient ID: Tracy Joseph, female    DOB: 11-24-1971, 42 y.o.   MRN: [id number]  HPI Pt here to re establish care, has longstanding h/o stage 2 HTN and has been on npo medication for some toime due to lack of health ins reportedly. Denies chest pain, palpitations, PND, orthopnea or leg  swelling. Denies fatigue or g headache , feels "well" Inconsistently attempt to lose weight and improve health witrh significant weigh gain   Review of Systems See HPI Denies recent fever or chills. Denies sinus pressure, nasal congestion, ear pain or sore throat. Denies chest congestion, productive cough or wheezing. Denies chest pains, palpitations and leg swelling Denies abdominal pain, nausea, vomiting,diarrhea or constipation.   Denies dysuria, frequency, hesitancy or incontinence. Denies joint pain, swelling and limitation in mobility. Denies headaches, seizures, numbness, or tingling. Denies depression, anxiety or insomnia. Denies skin break down or rash.        Objective:   Physical Exam  Patient alert and oriented and in no cardiopulmonary distress.  HEENT: No facial asymmetry, EOMI, no sinus tenderness,  oropharynx pink and moist.  Neck supple no adenopathy.  Chest: Clear to auscultation bilaterally.  CVS: S1, S2 no murmurs, no S3.  ABD: Soft non tender. Bowel sounds normal.  Ext: No edema  MS: Adequate ROM spine, shoulders, hips and knees.  Skin: Intact, no ulcerations or rash noted.  Psych: Good eye contact, normal affect. Memory intact not anxious or depressed appearing.  CNS: CN 2-12 intact, power, tone and sensation normal throughout.       Assessment & Plan:

## 2013-10-14 NOTE — Patient Instructions (Signed)
CPE  Mid February, call if you need me before   New medication for blood pressure is maxzide 50mg  one daily, no more benicar/hctz  HBA1C and chem 7, as soon as possible    Fasting lipid, chem 7, , HBA1C, CBc,, TSH and vit D mid Feb before visit   I encourage  You to take the flu vaccine through your job  It is important that you exercise regularly at least 30 minutes 5 times a week. If you develop chest pain, have severe difficulty breathing, or feel very tired, stop exercising immediately and seek medical attention   A healthy diet is rich in fruit, vegetables and whole grains. Poultry fish, nuts and beans are a healthy choice for protein rather then red meat. A low sodium diet and drinking 64 ounces of water daily is generally recommended. Oils and sweet should be limited. Carbohydrates especially for those who are diabetic or overweight, should be limited to 60-45 gram per meal. It is important to eat on a regular schedule, at least 3 times daily. Snacks should be primarily fruits, vegetables or nuts.

## 2013-10-23 ENCOUNTER — Emergency Department (HOSPITAL_BASED_OUTPATIENT_CLINIC_OR_DEPARTMENT_OTHER): Payer: Self-pay

## 2013-10-23 ENCOUNTER — Encounter (HOSPITAL_BASED_OUTPATIENT_CLINIC_OR_DEPARTMENT_OTHER): Payer: Self-pay | Admitting: Emergency Medicine

## 2013-10-23 ENCOUNTER — Emergency Department (HOSPITAL_BASED_OUTPATIENT_CLINIC_OR_DEPARTMENT_OTHER)
Admission: EM | Admit: 2013-10-23 | Discharge: 2013-10-23 | Disposition: A | Discharge status: Home / Self Care | Payer: Self-pay | Attending: Emergency Medicine | Admitting: Emergency Medicine

## 2013-10-23 DIAGNOSIS — I10 Essential (primary) hypertension: Secondary | ICD-10-CM | POA: Insufficient documentation

## 2013-10-23 DIAGNOSIS — E669 Obesity, unspecified: Secondary | ICD-10-CM | POA: Insufficient documentation

## 2013-10-23 DIAGNOSIS — Z79899 Other long term (current) drug therapy: Secondary | ICD-10-CM | POA: Insufficient documentation

## 2013-10-23 DIAGNOSIS — R51 Headache: Secondary | ICD-10-CM | POA: Insufficient documentation

## 2013-10-23 DIAGNOSIS — R519 Headache, unspecified: Secondary | ICD-10-CM

## 2013-10-23 DIAGNOSIS — R52 Pain, unspecified: Secondary | ICD-10-CM | POA: Insufficient documentation

## 2013-10-23 NOTE — ED Notes (Addendum)
Hypertension. Her HCTZ and Labetalol  was changed to [facility] for HTN a week ago. She just now took her BP medication.  Sudden onset of headache at work. She took Ibuprofen.

## 2013-10-23 NOTE — ED Provider Notes (Signed)
CSN: [id number]     Arrival date & time 10/23/13  1605 History   First MD Initiated Contact with Patient 10/23/13 1619     Chief Complaint  Patient presents with  . Hypertension   (Consider location/radiation/quality/duration/timing/severity/associated sxs/prior Treatment) HPI Comments: 42 year old female with a history of hypertension since with an acute onset of a headache that started 2 hours ago work. She states she has had a gradual improvement of her headache and also took 2 ibuprofen. She had her blood pressure checked at work showed a systolic blood pressure of 170 and advised her to go to the hospital. Patient states that her headache started on her left posterior neck and left frontal area and is now improved. She now has only a 5/10 pain in the posterior aspect of her head. Denies any current blurry vision, nausea, vomiting, chest pain, or weakness or numbness. She used to be on HCTZ and labetalol for her hypertension was changed to Maxzide week ago. When headache started she also took one of her blood pressure pills. States at home over the past week she's been running in the 170s 180s, and 190s systolically. No family history of aneurysms.   Past Medical History  Diagnosis Date  . Obesity   . Hypertension approx 2000   Past Surgical History  Procedure Laterality Date  . Tubal ligation  2005   Family History  Problem Relation Age of Onset  . Hypertension Mother   . Hyperlipidemia Mother    History  Substance Use Topics  . Smoking status: Never Smoker   . Smokeless tobacco: Not on file  . Alcohol Use: No   OB History   Grav Para Term Preterm Abortions TAB SAB Ect Mult Living                 Review of Systems  Constitutional: Negative for fever.  Eyes: Negative for visual disturbance.  Respiratory: Negative for shortness of breath.   Cardiovascular: Negative for chest pain.  Gastrointestinal: Negative for nausea, vomiting and abdominal pain.  Genitourinary:  Negative for dysuria.  Neurological: Positive for headaches. Negative for weakness and numbness.  All other systems reviewed and are negative.    Allergies  Ace inhibitors; Dust mite extract; and Pollen extract  Home Medications   Current Outpatient Rx  Name  Route  Sig  Dispense  Refill  . LISINOPRIL PO   Oral   Take by mouth.         . triamterene-hydrochlorothiazide (MAXZIDE) 75-50 MG per tablet   Oral   Take 1 tablet by mouth daily.   30 tablet   11    BP 177/122  Pulse 74  Temp(Src) 98.2 F (36.8 C) (Oral)  Resp 18  Ht 5' 5.5" (1.664 m)  Wt 226 lb (102.513 kg)  BMI 37.02 kg/m2  SpO2 99% Physical Exam  Nursing note and vitals reviewed. Constitutional: She is oriented to person, place, and time. She appears well-developed and well-nourished. No distress.  HENT:  Head: Normocephalic and atraumatic.  Right Ear: External ear normal.  Left Ear: External ear normal.  Nose: Nose normal.  Eyes: EOM are normal. Pupils are equal, round, and reactive to light. Right eye exhibits no discharge. Left eye exhibits no discharge.  Cardiovascular: Normal rate, regular rhythm and normal heart sounds.   Pulmonary/Chest: Effort normal and breath sounds normal.  Abdominal: Soft. There is no tenderness.  Neurological: She is alert and oriented to person, place, and time. She has normal strength.  No cranial nerve deficit or sensory deficit. She exhibits normal muscle tone. Coordination and gait normal. GCS eye subscore is 4. GCS verbal subscore is 5. GCS motor subscore is 6.  Skin: Skin is warm and dry.    ED Course  Procedures (including critical care time) Labs Review Labs Reviewed - No data to display Imaging Review Ct Head Wo Contrast  10/23/2013   CLINICAL DATA:  Left-sided headache, no trauma.  EXAM: CT HEAD WITHOUT CONTRAST  TECHNIQUE: Contiguous axial images were obtained from the base of the skull through the vertex without intravenous contrast.  COMPARISON:  Thyroid  sonogram May 07, 2009  FINDINGS: The ventricles and sulci are normal. No intraparenchymal hemorrhage, mass effect nor midline shift. No acute large vascular territory infarcts.  No abnormal extra-axial fluid collections. Basal cisterns are patent.  No skull fracture. Visualized paranasal sinuses and mastoid air-cells are well-aerated. The included ocular globes and orbital contents are non-suspicious.  IMPRESSION: No acute intracranial process ; normal noncontrast CT of the head.   Electronically Signed   By: Awilda Metro   On: 10/23/2013 17:20    EKG Interpretation   None       MDM   1. Headache   2. Hypertension    Patient is well-appearing and has a normal neurologic exam including gait. Head CT is negative. As her pain started within the last 3 hours, with a negative head CT, and no other risk factors as hypertension I feel subarachnoid hemorrhage or other acute intracranial process such as bleeding, infection, etc. are unlikely. Her blood pressure has been around the range is here for the past several days at home. As she has been changed to a new antihypertensive I will defer changing any medicines to her PCP. There is no evidence of acute end organ damage here.   Audree Camel, MD 10/23/13 [phone]

## 2013-10-24 ENCOUNTER — Telehealth: Payer: Self-pay

## 2013-10-24 NOTE — Telephone Encounter (Signed)
Patient states she went to the urgent care yesterday with a bad headache and her bp was very elevated so they kept her for a few hours. She can't remember how high but she thinks 180/100 + She said you changed her BP meds the other day when she was here. Pt wanted to follow up next week in the am before 10 but we have no early morning appts for weeks. Please advise

## 2013-10-24 NOTE — Telephone Encounter (Signed)
Message left notifying patient of appt that was scheduled for her Per Dr. Left message that we could give her a work note for her job but if was unable to keep appt to call and reschedule

## 2013-10-24 NOTE — Telephone Encounter (Signed)
Will need to get an appt for f/u here next week as I am her PCP, she can get a work excuse to cover the available appt time provided, pls explain and also work the best possible with her. I think she just started a new job Did they give her any new meds, she needs to bring meds to visit

## 2013-10-30 ENCOUNTER — Encounter: Payer: Self-pay | Admitting: Family Medicine

## 2013-10-30 ENCOUNTER — Ambulatory Visit (INDEPENDENT_AMBULATORY_CARE_PROVIDER_SITE_OTHER): Payer: Self-pay | Admitting: Family Medicine

## 2013-10-30 VITALS — BP 190/110 | HR 96 | Resp 16 | Wt 221.8 lb

## 2013-10-30 DIAGNOSIS — E669 Obesity, unspecified: Secondary | ICD-10-CM

## 2013-10-30 DIAGNOSIS — I10 Essential (primary) hypertension: Secondary | ICD-10-CM

## 2013-10-30 MED ORDER — AMLODIPINE BESYLATE 10 MG PO TABS: 10.0000 mg | ORAL_TABLET | Freq: Every day | ORAL | Status: DC

## 2013-10-30 NOTE — Patient Instructions (Signed)
F/u in 11 days, Tuesday before thanksgiving  New additonal medication for blood pressure, start today as soon as possible  Amlodipine 10mg  one daily  Take every day at same time starting tomorrow  With the maxzide you alreadynhave  Work excuse from today to return on Nov 17

## 2013-11-01 NOTE — Assessment & Plan Note (Signed)
Unchanged. Patient re-educated about  the importance of commitment to a  minimum of 150 minutes of exercise per week. The importance of healthy food choices with portion control discussed. Encouraged to start a food diary, count calories and to consider  joining a support group. Sample diet sheets offered. Goals set by the patient for the next several months.    

## 2013-11-01 NOTE — Assessment & Plan Note (Signed)
Uncontrolled, no neurologic complaint or deficit currently. DASH diet and commitment to daily physical activity for a minimum of 30 minutes discussed and encouraged, as a part of hypertension management. The importance of attaining a healthy weight is also discussed. Add amlodipine. Work excuse for 2 days

## 2013-11-01 NOTE — Progress Notes (Signed)
  Subjective:    Patient ID: Tracy Joseph, female    DOB: 06-28-1971, 42 y.o.   MRN: [id number]  HPI Pt here fpor f/u recent ED visit for headache.  Headache has resolved, CT scan was negative, blood pressure noted to be high in the Ed , no med change made , here for f/u. Denies localized weakness or numbness, has been taking her meds.   Review of Systems See HPI Denies recent fever or chills. Denies sinus pressure, nasal congestion, ear pain or sore throat. Denies chest congestion, productive cough or wheezing. Denies chest pains, palpitations and leg swelling        Objective:   Physical Exam  Patient alert and oriented and in no cardiopulmonary distress.  HEENT: No facial asymmetry, EOMI, no sinus tenderness,  oropharynx pink and moist.  Neck supple no adenopathy.  Chest: Clear to auscultation bilaterally.  CVS: S1, S2 no murmurs, no S3.  ABD: Soft non tender. Bowel sounds normal.  Ext: No edema    CNS: CN 2-12 intact, power, tone and sensation normal throughout.       Assessment & Plan:

## 2013-11-11 ENCOUNTER — Ambulatory Visit: Payer: Self-pay | Admitting: Family Medicine

## 2013-11-16 ENCOUNTER — Encounter: Payer: Self-pay | Admitting: Family Medicine

## 2013-11-16 DIAGNOSIS — D126 Benign neoplasm of colon, unspecified: Secondary | ICD-10-CM | POA: Insufficient documentation

## 2013-11-16 DIAGNOSIS — E890 Postprocedural hypothyroidism: Secondary | ICD-10-CM | POA: Insufficient documentation

## 2013-11-16 NOTE — Assessment & Plan Note (Addendum)
Uncontrolled hTN , pt has been on no medication states due to lack of insurance, danger of this discussed, she will resume maxzide daily DASH diet and commitment to daily physical activity for a minimum of 30 minutes discussed and encouraged, as a part of hypertension management. The importance of attaining a healthy weight is also discussed.

## 2013-11-16 NOTE — Assessment & Plan Note (Signed)
Patient educated about the importance of limiting  Carbohydrate intake , the need to commit to daily physical activity for a minimum of 30 minutes , and to commit weight loss. The fact that changes in all these areas will reduce or eliminate all together the development of diabetes is stressed.   updated lab needed 

## 2013-11-16 NOTE — Assessment & Plan Note (Signed)
Deteriorated. Patient re-educated about  the importance of commitment to a  minimum of 150 minutes of exercise per week. The importance of healthy food choices with portion control discussed. Encouraged to start a food diary, count calories and to consider  joining a support group. Sample diet sheets offered. Goals set by the patient for the next several months.    

## 2014-02-04 ENCOUNTER — Encounter: Payer: Self-pay | Admitting: Family Medicine

## 2014-03-16 ENCOUNTER — Encounter (INDEPENDENT_AMBULATORY_CARE_PROVIDER_SITE_OTHER): Payer: Self-pay | Admitting: *Deleted

## 2014-11-15 ENCOUNTER — Other Ambulatory Visit: Payer: Self-pay | Admitting: Family Medicine

## 2014-11-18 ENCOUNTER — Telehealth: Payer: Self-pay | Admitting: Family Medicine

## 2014-11-18 DIAGNOSIS — I10 Essential (primary) hypertension: Secondary | ICD-10-CM

## 2014-11-19 NOTE — Telephone Encounter (Signed)
Left message for patient to call back  

## 2014-11-19 NOTE — Telephone Encounter (Signed)
No meds will be refilled from this office. No OV in over a year

## 2014-11-23 MED ORDER — AMLODIPINE BESYLATE 10 MG PO TABS: 10.0000 mg | ORAL_TABLET | Freq: Every day | ORAL | Status: DC

## 2014-11-23 MED ORDER — TRIAMTERENE-HCTZ 75-50 MG PO TABS: 1.0000 | ORAL_TABLET | Freq: Every day | ORAL | Status: DC

## 2014-11-23 NOTE — Telephone Encounter (Signed)
Patient has an appointment 1.13.2016 please fill bp meds

## 2014-11-23 NOTE — Telephone Encounter (Signed)
meds refilled for 30 days only

## 2014-12-30 ENCOUNTER — Encounter: Payer: Self-pay | Admitting: Family Medicine

## 2014-12-30 ENCOUNTER — Ambulatory Visit (INDEPENDENT_AMBULATORY_CARE_PROVIDER_SITE_OTHER): Payer: Self-pay | Admitting: Family Medicine

## 2014-12-30 VITALS — BP 168/88 | HR 78 | Resp 18 | Ht 66.0 in | Wt 230.1 lb

## 2014-12-30 DIAGNOSIS — E890 Postprocedural hypothyroidism: Secondary | ICD-10-CM

## 2014-12-30 DIAGNOSIS — J3089 Other allergic rhinitis: Secondary | ICD-10-CM

## 2014-12-30 DIAGNOSIS — IMO0001 Reserved for inherently not codable concepts without codable children: Secondary | ICD-10-CM

## 2014-12-30 DIAGNOSIS — R7301 Impaired fasting glucose: Secondary | ICD-10-CM

## 2014-12-30 DIAGNOSIS — I10 Essential (primary) hypertension: Secondary | ICD-10-CM

## 2014-12-30 MED ORDER — HYDRALAZINE HCL 100 MG PO TABS: 100.0000 mg | ORAL_TABLET | Freq: Two times a day (BID) | ORAL | Status: DC

## 2014-12-30 MED ORDER — AMLODIPINE BESYLATE 10 MG PO TABS: 10.0000 mg | ORAL_TABLET | Freq: Every day | ORAL | Status: DC

## 2014-12-30 NOTE — Progress Notes (Signed)
   Subjective:    Patient ID: Tracy Joseph, female    DOB: 08/11/1971, 44 y.o.   MRN: [id number]  HPI The PT is here for follow up and re-evaluation of chronic medical conditions, medication management and review of any available recent lab and radiology data.  Preventive health is updated, specifically  Cancer screening and Immunization.  Health maintenance is past due, pt has been unemployed for over 2 years, and is still currently uninsured, plans to get tests asap The PT states maxzide causes excess urination and cramping wants to change this  C.o inctrased nasal congestion, sneezing and clear drainage  Review of Systems See HPI Denies recent fever or chills. Denies sinus pressure, , ear pain or sore throat. Denies chest congestion, productive cough or wheezing. Denies chest pains, palpitations and leg swelling Denies abdominal pain, nausea, vomiting,diarrhea or constipation.   Denies dysuria, frequency, hesitancy or incontinence. Denies joint pain, swelling and limitation in mobility. Denies headaches, seizures, numbness, or tingling. Denies depression, anxiety or insomnia. Denies skin break down or rash.        Objective:   Physical Exam BP 168/88 mmHg  Pulse 78  Resp 18  Ht 5\' 6"  (1.676 m)  Wt 230 lb 1.3 oz (104.364 kg)  BMI 37.15 kg/m2  SpO2 98% Patient alert and oriented and in no cardiopulmonary distress.  HEENT: No facial asymmetry, EOMI,   oropharynx pink and moist.  Neck supple no JVD, no mass. Noo sinus tenderness, erythema and edema of nasal mucosa  Chest: Clear to auscultation bilaterally.  CVS: S1, S2 no murmurs, no S3.Regular rate.  ABD: Soft non tender.   Ext: No edema  MS: Adequate ROM spine, shoulders, hips and knees.  Skin: Intact, no ulcerations or rash noted.  Psych: Good eye contact, normal affect. Memory intact not anxious or depressed appearing.  CNS: CN 2-12 intact, power,  normal throughout.no focal deficits noted.          Assessment & Plan:  Essential hypertension, malignant Uncontrolled, medicatio n is adjusted  With close follow up Labs ordered and will be done by pt asap DASH diet and commitment to daily physical activity for a minimum of 30 minutes discussed and encouraged, as a part of hypertension management. The importance of attaining a healthy weight is also discussed.     Hypothyroidism following radioiodine therapy Updated lab is past due and pt may need to return to endo also   Obesity, Class II, BMI 35.0-39.9, with comorbidity (see actual BMI) Deteriorated. Patient re-educated about  the importance of commitment to a  minimum of 150 minutes of exercise per week. The importance of healthy food choices with portion control discussed. Encouraged to start a food diary, count calories and to consider  joining a support group. Sample diet sheets offered. Goals set by the patient for the next several months.      IMPAIRED FASTING GLUCOSE Patient educated about the importance of limiting  Carbohydrate intake , the need to commit to daily physical activity for a minimum of 30 minutes , and to commit weight loss. The fact that changes in all these areas will reduce or eliminate all together the development of diabetes is stressed.   Updated lab needed at/ before next visit.    Environmental and seasonal allergies Uncontrolled, pt to start daily claritin

## 2014-12-30 NOTE — Patient Instructions (Addendum)
F/u 2nd week in March, call if you need me before  STOP triamterene due to s/e , instead start hydralazine one twice daily, every 12 hours, 9am and 9pm  L ipid, chem 7 , tSH, hBA1C, CBc  Today   Blood pressure is high, read and follow instruction sheet   Start oTC loratidine 10 mg (claritin) one daily for allergies

## 2015-01-03 ENCOUNTER — Telehealth: Payer: Self-pay | Admitting: Family Medicine

## 2015-01-03 DIAGNOSIS — E890 Postprocedural hypothyroidism: Secondary | ICD-10-CM

## 2015-01-03 DIAGNOSIS — I10 Essential (primary) hypertension: Secondary | ICD-10-CM

## 2015-01-03 NOTE — Assessment & Plan Note (Signed)
Uncontrolled, medicatio n is adjusted  With close follow up Labs ordered and will be done by pt asap DASH diet and commitment to daily physical activity for a minimum of 30 minutes discussed and encouraged, as a part of hypertension management. The importance of attaining a healthy weight is also discussed.

## 2015-01-03 NOTE — Assessment & Plan Note (Signed)
Deteriorated. Patient re-educated about  the importance of commitment to a  minimum of 150 minutes of exercise per week. The importance of healthy food choices with portion control discussed. Encouraged to start a food diary, count calories and to consider  joining a support group. Sample diet sheets offered. Goals set by the patient for the next several months.    

## 2015-01-03 NOTE — Assessment & Plan Note (Signed)
Updated lab is past due and pt may need to return to endo also

## 2015-01-03 NOTE — Assessment & Plan Note (Signed)
Patient educated about the importance of limiting  Carbohydrate intake , the need to commit to daily physical activity for a minimum of 30 minutes , and to commit weight loss. The fact that changes in all these areas will reduce or eliminate all together the development of diabetes is stressed.   Updated lab needed at/ before next visit.  

## 2015-01-03 NOTE — Telephone Encounter (Signed)
When pt left the office stated she would get labs despite having no ins after this lab called to see if any were "vital" Pls ask pt if she can get fating chem 7 and tsh, if so ask her to collec/ send order with only thoose 2 labs  ???pls ask

## 2015-01-03 NOTE — Assessment & Plan Note (Signed)
Uncontrolled, pt to start daily claritin

## 2015-01-04 ENCOUNTER — Telehealth: Payer: Self-pay | Admitting: Family Medicine

## 2015-01-04 NOTE — Addendum Note (Signed)
Addended by: Eual Fines on: 01/04/2015 02:08 PM   Modules accepted: Orders

## 2015-01-04 NOTE — Telephone Encounter (Signed)
See previous message

## 2015-01-04 NOTE — Telephone Encounter (Signed)
Order mailed

## 2015-01-04 NOTE — Telephone Encounter (Signed)
Called patient and left message for them to return call at the office   

## 2015-01-05 ENCOUNTER — Telehealth: Payer: Self-pay | Admitting: Family Medicine

## 2015-01-05 NOTE — Telephone Encounter (Signed)
Called patient and left message for them to return call at the office   

## 2015-01-06 NOTE — Telephone Encounter (Signed)
Called patient and left message for them to return call at the office   

## 2015-03-01 ENCOUNTER — Ambulatory Visit: Payer: Self-pay | Admitting: Family Medicine

## 2015-04-06 ENCOUNTER — Other Ambulatory Visit: Payer: Self-pay | Admitting: Family Medicine

## 2015-04-06 ENCOUNTER — Ambulatory Visit (INDEPENDENT_AMBULATORY_CARE_PROVIDER_SITE_OTHER): Payer: PRIVATE HEALTH INSURANCE | Admitting: Family Medicine

## 2015-04-06 ENCOUNTER — Encounter: Payer: Self-pay | Admitting: Family Medicine

## 2015-04-06 VITALS — BP 150/88 | HR 90 | Resp 18 | Ht 66.0 in | Wt 235.1 lb

## 2015-04-06 DIAGNOSIS — I10 Essential (primary) hypertension: Secondary | ICD-10-CM | POA: Diagnosis not present

## 2015-04-06 DIAGNOSIS — Z23 Encounter for immunization: Secondary | ICD-10-CM | POA: Diagnosis not present

## 2015-04-06 DIAGNOSIS — E890 Postprocedural hypothyroidism: Secondary | ICD-10-CM

## 2015-04-06 DIAGNOSIS — J3089 Other allergic rhinitis: Secondary | ICD-10-CM

## 2015-04-06 DIAGNOSIS — E039 Hypothyroidism, unspecified: Secondary | ICD-10-CM

## 2015-04-06 DIAGNOSIS — R0789 Other chest pain: Secondary | ICD-10-CM

## 2015-04-06 DIAGNOSIS — R0683 Snoring: Secondary | ICD-10-CM

## 2015-04-06 DIAGNOSIS — D509 Iron deficiency anemia, unspecified: Secondary | ICD-10-CM

## 2015-04-06 DIAGNOSIS — G4730 Sleep apnea, unspecified: Secondary | ICD-10-CM | POA: Insufficient documentation

## 2015-04-06 DIAGNOSIS — N920 Excessive and frequent menstruation with regular cycle: Secondary | ICD-10-CM

## 2015-04-06 DIAGNOSIS — R5383 Other fatigue: Secondary | ICD-10-CM | POA: Diagnosis not present

## 2015-04-06 DIAGNOSIS — IMO0001 Reserved for inherently not codable concepts without codable children: Secondary | ICD-10-CM

## 2015-04-06 DIAGNOSIS — Z1159 Encounter for screening for other viral diseases: Secondary | ICD-10-CM

## 2015-04-06 DIAGNOSIS — G4733 Obstructive sleep apnea (adult) (pediatric): Secondary | ICD-10-CM

## 2015-04-06 DIAGNOSIS — D126 Benign neoplasm of colon, unspecified: Secondary | ICD-10-CM

## 2015-04-06 DIAGNOSIS — R7301 Impaired fasting glucose: Secondary | ICD-10-CM

## 2015-04-06 DIAGNOSIS — E049 Nontoxic goiter, unspecified: Secondary | ICD-10-CM

## 2015-04-06 DIAGNOSIS — Z1231 Encounter for screening mammogram for malignant neoplasm of breast: Secondary | ICD-10-CM

## 2015-04-06 DIAGNOSIS — R079 Chest pain, unspecified: Secondary | ICD-10-CM | POA: Insufficient documentation

## 2015-04-06 LAB — CBC
HEMATOCRIT: 25.5 % — AB (ref 36.0–46.0)
HEMOGLOBIN: 7.5 g/dL — AB (ref 12.0–15.0)
MCH: 18.7 pg — AB (ref 26.0–34.0)
MCHC: 29.4 g/dL — ABNORMAL LOW (ref 30.0–36.0)
MCV: 63.6 fL — ABNORMAL LOW (ref 78.0–100.0)
MPV: 8.5 fL — ABNORMAL LOW (ref 8.6–12.4)
Platelets: 442 10*3/uL — ABNORMAL HIGH (ref 150–400)
RBC: 4.01 MIL/uL (ref 3.87–5.11)
RDW: 19.8 % — ABNORMAL HIGH (ref 11.5–15.5)
WBC: 8.8 10*3/uL (ref 4.0–10.5)

## 2015-04-06 MED ORDER — TRIAMTERENE-HCTZ 37.5-25 MG PO TABS: 1.0000 | ORAL_TABLET | Freq: Every day | ORAL | Status: DC

## 2015-04-06 NOTE — Patient Instructions (Signed)
F/U in 2 month, call if you need me before  Labs today  TdaP today   EKG today, due to chest pain and fatigue. You are referred to cardiology  You are referred to Dr Merlene Laughter for sleep apnea  New additional tablet triamterene one daily for Blood pressure continue the other 2 and take at same tiime every day  You are referred for as mammogram  Thanks for choosing [facility], we consider it a privelige to serve you.

## 2015-04-06 NOTE — Assessment & Plan Note (Signed)
After obtaining informed consent, the vaccine is  administered by LPN.  

## 2015-04-06 NOTE — Progress Notes (Signed)
Subjective:    Patient ID: Tracy Joseph, female    DOB: [birth year], 44 y.o.   MRN: [id number]  HPI Pt in for f/u and essentially to resume taking care of her  health  after being away for some time  C/o excessive fatigue , excessive snoring and daytime sleepiness. C/o exertional chest pain when walking up the slight incline to her workplace noted in past several months Reports heavy menses No regular exercise currently , wants to start , but tired and having chest pain Will work on food choice in the interim Review of Systems See HPI Denies recent fever or chills. Denies sinus pressure, nasal congestion, ear pain or sore throat. Denies chest congestion, productive cough or wheezing. Denies PND or  palpitations has had some  leg swelling Denies abdominal pain, nausea, vomiting,diarrhea or constipation.   Denies dysuria, frequency, hesitancy or incontinence. Denies joint pain, swelling and limitation in mobility.  Denies depression, anxiety or insomnia. Denies skin break down or rash.        Objective:   Physical Exam BP 150/88 mmHg  Pulse 90  Resp 18  Ht 5\' 6"  (1.676 m)  Wt 235 lb 1.9 oz (106.65 kg)  BMI 37.97 kg/m2  SpO2 98%  Patient alert and oriented and in no cardiopulmonary distress.  HEENT: No facial asymmetry, EOMI,   oropharynx and moist.  Neck supple, thyromegaly, no JVD, no bruit. TM clear, no sinus tenderness  Chest: Clear to auscultation bilaterally.  CVS: S1, S2 no murmurs, no S3.Regular rate. EKG: NSR, possible septal infarct, No LVH ABD: Soft non tender.   Ext: No edema  MS: Adequate ROM spine, shoulders, hips and knees.  Skin: Intact, no ulcerations or rash noted.  Psych: Good eye contact, normal affect. Memory intact not anxious or depressed appearing.  CNS: CN 2-12 intact, power,  normal throughout.no focal deficits noted.        Assessment & Plan:  Need for diphtheria-tetanus-pertussis (Tdap) vaccine, adult/adolescent After  obtaining informed consent, the vaccine is  administered by LPN.    Essential hypertension, malignant Uncontrolled Add triamterene daily DASH diet and commitment to daily physical activity for a minimum of 30 minutes discussed and encouraged, as a part of hypertension management. The importance of attaining a healthy weight is also discussed.  BP/Weight 04/06/2015 12/30/2014 10/30/2013 10/23/2013 10/14/2013 07/05/2013 3/38/2505  Systolic BP 397 673 419 379 024 097 353  Diastolic BP 88 88 299 242 92 106 90  Wt. (Lbs) 235.12 230.08 221.8 226 226 215 235  BMI 37.97 37.15 36.34 37.02 37.34 35.78 40.32         Obstructive sleep apnea Classic symptoms of sleep apnea, refer to neurology for testing and treatment   Goiter Thyroid US to re evaluate   Hypothyroidism following radioiodine therapy Has not been evaluated or treated by endo for over 2 years , needs to re establish , and will obtain US of gland also   IMPAIRED FASTING GLUCOSE Updated lab obtained after the visit, pt advised of result and necessary changes needed to improve her blood sugar. Will hold off on offering metformin as she has multiple problems at this time, and she will be seeing endo re her thyroid in the near future  Patient educated about the importance of limiting  Carbohydrate intake , the need to commit to daily physical activity for a minimum of 30 minutes , and to commit weight loss. The fact that changes in all these areas will reduce or eliminate all  together the development of diabetes is stressed.   Diabetic Labs Latest Ref Rng 04/06/2015 01/04/2011 02/01/2009 02/03/2008  HbA1c <5.7 % 6.0(H) 6.2(H) - -  Chol 0 - 200 mg/dL 194 150 176 160  HDL >=46 mg/dL 57 41 52 44  Calc LDL 0 - 99 mg/dL 112(H) 89 106(H) 97  Triglycerides <150 mg/dL 124 102 91 95  Creatinine 0.50 - 1.10 mg/dL 0.99 0.87 1.05 0.91   BP/Weight 04/06/2015 12/30/2014 10/30/2013 10/23/2013 10/14/2013 07/05/2013 3/41/9622  Systolic BP 297 989  211 941 740 814 481  Diastolic BP 88 88 856 314 92 106 90  Wt. (Lbs) 235.12 230.08 221.8 226 226 215 235  BMI 37.97 37.15 36.34 37.02 37.34 35.78 40.32   No flowsheet data found.      Obesity, Class II, BMI 35.0-39.9, with comorbidity (see actual BMI) Deteriorated. Patient re-educated about  the importance of commitment to a  minimum of 150 minutes of exercise per week.  The importance of healthy food choices with portion control discussed. Encouraged to start a food diary, count calories and to consider  joining a support group. Sample diet sheets offered. Goals set by the patient for the next several months.   Weight /BMI 04/06/2015 12/30/2014 10/30/2013  WEIGHT 235 lb 1.9 oz 230 lb 1.3 oz 221 lb 12.8 oz  HEIGHT 5\' 6"  5\' 6"  -  BMI 37.97 kg/m2 37.15 kg/m2 36.34 kg/m2    Current exercise per week 30 minutes.Experiencing exertional fatigue     Chest pain Reports exertional chest pain in the past several weeks, especially when walking up a slight incline CV risk factors include HTN, iGT, dylipidemia and obesity , EKG in office today and cardiology eval warranted EKG shows sinus rhythm with possible septal infarct, no LVH   Environmental and seasonal allergies Daily use of OTC allergy med advised, will add steroid nasal spray if needed, flonase is available as oTC med   Heavy menses C/o extremely heavy cycles, and has severe iron deficiency anemia. Needs pelvic US and gyne eval , she will also start supplemental iron OTC, with close f/u   Iron deficiency anemia Severe and most likely a very significant  Contributor to her fatigue Start OTC iron 325 mg three times daily and repeat lab    Tubular adenoma of colon Colonoscopy past due, pt also has IDA, she had a large tubular adenoma, will refer to Dr Laural Golden who did her previous colonscopy, and his office had sent her a reminder letter in 2015 when it was due

## 2015-04-07 ENCOUNTER — Encounter: Payer: Self-pay | Admitting: Family Medicine

## 2015-04-07 LAB — COMPLETE METABOLIC PANEL WITH GFR
ALT: <8 U/L (ref 0–35)
AST: 16 U/L (ref 0–37)
Albumin: 4.2 g/dL (ref 3.5–5.2)
Alkaline Phosphatase: 70 U/L (ref 39–117)
BUN: 11 mg/dL (ref 6–23)
CO2: 24 meq/L (ref 19–32)
CREATININE: 0.99 mg/dL (ref 0.50–1.10)
Calcium: 9.5 mg/dL (ref 8.4–10.5)
Chloride: 99 mEq/L (ref 96–112)
GFR, Est African American: 80 mL/min
GFR, Est Non African American: 69 mL/min
GLUCOSE: 81 mg/dL (ref 70–99)
Potassium: 3.7 mEq/L (ref 3.5–5.3)
Sodium: 136 mEq/L (ref 135–145)
Total Bilirubin: 0.3 mg/dL (ref 0.2–1.2)
Total Protein: 7.8 g/dL (ref 6.0–8.3)

## 2015-04-07 LAB — VITAMIN B12: VITAMIN B 12: 380 pg/mL (ref 211–911)

## 2015-04-07 LAB — IRON: IRON: 14 ug/dL — AB (ref 42–145)

## 2015-04-07 LAB — LIPID PANEL
Cholesterol: 194 mg/dL (ref 0–200)
HDL: 57 mg/dL (ref >=46)
LDL CALC: 112 mg/dL — AB (ref 0–99)
TRIGLYCERIDES: 124 mg/dL (ref <150)
Total CHOL/HDL Ratio: 3.4 Ratio
VLDL: 25 mg/dL (ref 0–40)

## 2015-04-07 LAB — T3, FREE: T3 FREE: 1.4 pg/mL — AB (ref 2.3–4.2)

## 2015-04-07 LAB — VITAMIN D 25 HYDROXY (VIT D DEFICIENCY, FRACTURES): VIT D 25 HYDROXY: 9 ng/mL — AB (ref 30–100)

## 2015-04-07 LAB — HEMOGLOBIN A1C
Hgb A1c MFr Bld: 6 % — ABNORMAL HIGH (ref <5.7)
MEAN PLASMA GLUCOSE: 126 mg/dL — AB (ref <117)

## 2015-04-07 LAB — T4, FREE: FREE T4: 0.31 ng/dL — AB (ref 0.80–1.80)

## 2015-04-07 LAB — FERRITIN: Ferritin: 5 ng/mL — ABNORMAL LOW (ref 10–291)

## 2015-04-07 LAB — TSH: TSH: 39.351 u[IU]/mL — ABNORMAL HIGH (ref 0.350–4.500)

## 2015-04-07 LAB — HIV ANTIBODY (ROUTINE TESTING W REFLEX): HIV: NONREACTIVE

## 2015-04-08 ENCOUNTER — Other Ambulatory Visit: Payer: Self-pay | Admitting: Family Medicine

## 2015-04-08 DIAGNOSIS — D508 Other iron deficiency anemias: Secondary | ICD-10-CM

## 2015-04-08 DIAGNOSIS — E049 Nontoxic goiter, unspecified: Secondary | ICD-10-CM

## 2015-04-08 DIAGNOSIS — N920 Excessive and frequent menstruation with regular cycle: Secondary | ICD-10-CM

## 2015-04-08 DIAGNOSIS — E038 Other specified hypothyroidism: Secondary | ICD-10-CM

## 2015-04-08 NOTE — Addendum Note (Signed)
Addended by: Eual Fines on: 04/08/2015 12:54 PM   Modules accepted: Orders

## 2015-04-09 DIAGNOSIS — D509 Iron deficiency anemia, unspecified: Secondary | ICD-10-CM | POA: Insufficient documentation

## 2015-04-09 DIAGNOSIS — N920 Excessive and frequent menstruation with regular cycle: Secondary | ICD-10-CM | POA: Insufficient documentation

## 2015-04-09 NOTE — Assessment & Plan Note (Signed)
Updated lab obtained after the visit, pt advised of result and necessary changes needed to improve her blood sugar. Will hold off on offering metformin as she has multiple problems at this time, and she will be seeing endo re her thyroid in the near future  Patient educated about the importance of limiting  Carbohydrate intake , the need to commit to daily physical activity for a minimum of 30 minutes , and to commit weight loss. The fact that changes in all these areas will reduce or eliminate all together the development of diabetes is stressed.   Diabetic Labs Latest Ref Rng 04/06/2015 01/04/2011 02/01/2009 02/03/2008  HbA1c <5.7 % 6.0(H) 6.2(H) - -  Chol 0 - 200 mg/dL 194 150 176 160  HDL >=46 mg/dL 57 41 52 44  Calc LDL 0 - 99 mg/dL 112(H) 89 106(H) 97  Triglycerides <150 mg/dL 124 102 91 95  Creatinine 0.50 - 1.10 mg/dL 0.99 0.87 1.05 0.91   BP/Weight 04/06/2015 12/30/2014 10/30/2013 10/23/2013 10/14/2013 07/05/2013 0/02/4916  Systolic BP 915 056 979 480 165 537 482  Diastolic BP 88 88 707 867 92 106 90  Wt. (Lbs) 235.12 230.08 221.8 226 226 215 235  BMI 37.97 37.15 36.34 37.02 37.34 35.78 40.32   No flowsheet data found.

## 2015-04-09 NOTE — Assessment & Plan Note (Signed)
Has not been evaluated or treated by endo for over 2 years , needs to re establish , and will obtain US of gland also

## 2015-04-09 NOTE — Assessment & Plan Note (Signed)
Classic symptoms of sleep apnea, refer to neurology for testing and treatment

## 2015-04-09 NOTE — Assessment & Plan Note (Signed)
Thyroid US to re evaluate

## 2015-04-09 NOTE — Assessment & Plan Note (Addendum)
Reports exertional chest pain in the past several weeks, especially when walking up a slight incline CV risk factors include HTN, iGT, dylipidemia and obesity , EKG in office today and cardiology eval warranted EKG shows sinus rhythm with possible septal infarct, no LVH

## 2015-04-09 NOTE — Assessment & Plan Note (Signed)
C/o extremely heavy cycles, and has severe iron deficiency anemia. Needs pelvic US and gyne eval , she will also start supplemental iron OTC, with close f/u

## 2015-04-09 NOTE — Assessment & Plan Note (Signed)
Severe and most likely a very significant  Contributor to her fatigue Start OTC iron 325 mg three times daily and repeat lab

## 2015-04-09 NOTE — Assessment & Plan Note (Signed)
Deteriorated. Patient re-educated about  the importance of commitment to a  minimum of 150 minutes of exercise per week.  The importance of healthy food choices with portion control discussed. Encouraged to start a food diary, count calories and to consider  joining a support group. Sample diet sheets offered. Goals set by the patient for the next several months.   Weight /BMI 04/06/2015 12/30/2014 10/30/2013  WEIGHT 235 lb 1.9 oz 230 lb 1.3 oz 221 lb 12.8 oz  HEIGHT 5\' 6"  5\' 6"  -  BMI 37.97 kg/m2 37.15 kg/m2 36.34 kg/m2    Current exercise per week 30 minutes.Experiencing exertional fatigue

## 2015-04-09 NOTE — Assessment & Plan Note (Signed)
Daily use of OTC allergy med advised, will add steroid nasal spray if needed, flonase is available as oTC med

## 2015-04-09 NOTE — Assessment & Plan Note (Signed)
Uncontrolled Add triamterene daily DASH diet and commitment to daily physical activity for a minimum of 30 minutes discussed and encouraged, as a part of hypertension management. The importance of attaining a healthy weight is also discussed.  BP/Weight 04/06/2015 12/30/2014 10/30/2013 10/23/2013 10/14/2013 07/05/2013 7/40/8144  Systolic BP 818 563 149 702 637 858 850  Diastolic BP 88 88 277 412 92 106 90  Wt. (Lbs) 235.12 230.08 221.8 226 226 215 235  BMI 37.97 37.15 36.34 37.02 37.34 35.78 40.32

## 2015-04-09 NOTE — Assessment & Plan Note (Addendum)
Colonoscopy past due, pt also has IDA, she had a large tubular adenoma, will refer to Dr Laural Golden who did her previous colonscopy, and his office had sent her a reminder letter in 2015 when it was due

## 2015-04-13 ENCOUNTER — Encounter (INDEPENDENT_AMBULATORY_CARE_PROVIDER_SITE_OTHER): Payer: Self-pay | Admitting: *Deleted

## 2015-04-15 ENCOUNTER — Other Ambulatory Visit (HOSPITAL_COMMUNITY): Payer: PRIVATE HEALTH INSURANCE

## 2015-04-15 ENCOUNTER — Ambulatory Visit (HOSPITAL_COMMUNITY): Admission: RE | Admit: 2015-04-15 | Payer: PRIVATE HEALTH INSURANCE | Source: Ambulatory Visit

## 2015-04-16 ENCOUNTER — Ambulatory Visit (HOSPITAL_COMMUNITY)
Admission: RE | Admit: 2015-04-16 | Discharge: 2015-04-16 | Disposition: A | Discharge status: Home / Self Care | Payer: No Typology Code available for payment source | Source: Ambulatory Visit | Attending: Family Medicine | Admitting: Family Medicine

## 2015-04-16 DIAGNOSIS — E049 Nontoxic goiter, unspecified: Secondary | ICD-10-CM

## 2015-04-16 DIAGNOSIS — E038 Other specified hypothyroidism: Secondary | ICD-10-CM

## 2015-04-16 DIAGNOSIS — E039 Hypothyroidism, unspecified: Secondary | ICD-10-CM | POA: Insufficient documentation

## 2015-04-16 DIAGNOSIS — D508 Other iron deficiency anemias: Secondary | ICD-10-CM

## 2015-04-16 DIAGNOSIS — D649 Anemia, unspecified: Secondary | ICD-10-CM | POA: Diagnosis not present

## 2015-04-16 DIAGNOSIS — N920 Excessive and frequent menstruation with regular cycle: Secondary | ICD-10-CM

## 2015-04-23 ENCOUNTER — Ambulatory Visit (INDEPENDENT_AMBULATORY_CARE_PROVIDER_SITE_OTHER): Payer: PRIVATE HEALTH INSURANCE | Admitting: Endocrinology

## 2015-04-23 ENCOUNTER — Encounter: Payer: Self-pay | Admitting: Endocrinology

## 2015-04-23 VITALS — BP 138/90 | HR 88 | Temp 98.3°F

## 2015-04-23 DIAGNOSIS — E890 Postprocedural hypothyroidism: Secondary | ICD-10-CM | POA: Diagnosis not present

## 2015-04-23 MED ORDER — LEVOTHYROXINE SODIUM 100 MCG PO TABS: 100.0000 ug | ORAL_TABLET | Freq: Every day | ORAL | Status: DC

## 2015-04-23 NOTE — Progress Notes (Signed)
Subjective:    Patient ID: Tracy Joseph, female    DOB: 01/13/1971, 44 y.o.   MRN: [id number]  HPI Pt was noted to have goiter in 2010.  When she was noted to have hyperthyroidism in 2012, she had i-131 rx.  She has never been on thyroid medication.  She has slight swelling in both legs, and assoc fatigue.   Past Medical History  Diagnosis Date  . Obesity   . Hypertension approx 2000  . Hyperthyroidism 2012    treated through endo with RAI  . Prediabetes 2012    Past Surgical History  Procedure Laterality Date  . Tubal ligation  2005    History   Social History  . Marital Status: Married    Spouse Name: N/A  . Number of Children: 1  . Years of Education: N/A   Occupational History  . full time student for [employer]    Social History Main Topics  . Smoking status: Never Smoker   . Smokeless tobacco: Not on file  . Alcohol Use: No  . Drug Use: No  . Sexual Activity: Yes   Other Topics Concern  . Not on file   Social History Narrative    Current Outpatient Prescriptions on File Prior to Visit  Medication Sig Dispense Refill  . amLODipine (NORVASC) 10 MG tablet Take 1 tablet (10 mg total) by mouth daily. 30 tablet 3  . hydrALAZINE (APRESOLINE) 100 MG tablet Take 1 tablet (100 mg total) by mouth 2 (two) times daily. 60 tablet 3  . triamterene-hydrochlorothiazide (MAXZIDE-25) 37.5-25 MG per tablet Take 1 tablet by mouth daily. 30 tablet 3   No current facility-administered medications on file prior to visit.    Allergies  Allergen Reactions  . Ace Inhibitors Cough  . Dust Mite Extract   . Pollen Extract     Family History  Problem Relation Age of Onset  . Hypertension Mother   . Hyperlipidemia Mother   . Thyroid disease Paternal Grandmother     BP 138/90 mmHg  Pulse 88  Temp(Src) 98.3 F (36.8 C) (Oral)  SpO2 98%    Review of Systems denies depression, hair loss, weight gain, blurry vision, dry skin, rhinorrhea, and syncope.  She has cold  intolerance.  She has heavy menses, myalgias, easy bruising, acral numbness, doe, constipation, and leg cramps.       Objective:   Physical Exam VS: see vs page GEN: no distress HEAD: head: no deformity eyes: no periorbital swelling, no proptosis external nose and ears are normal mouth: no lesion seen NECK: supple, thyroid is not enlarged CHEST WALL: no deformity LUNGS:  Clear to auscultation CV: reg rate and rhythm, no murmur ABD: abdomen is soft, nontender.  no hepatosplenomegaly.  not distended.  no hernia MUSCULOSKELETAL: muscle bulk and strength are grossly normal.  no obvious joint swelling.  gait is normal and steady EXTEMITIES: no deformity.  no ulcer on the feet.  feet are of normal color and temp.  no edema PULSES: dorsalis pedis intact bilat.  no carotid bruit NEURO:  cn 2-12 grossly intact.   readily moves all 4's.  sensation is intact to touch on the feet SKIN:  Normal texture and temperature.  No rash or suspicious lesion is visible.   NODES:  None palpable at the neck.   PSYCH: alert, well-oriented.  Does not appear anxious nor depressed.    Lab Results  Component Value Date   TSH 39.351* 04/06/2015   T4TOTAL 13.4* 01/05/2011  Assessment & Plan:  Multinodular goiter, new to me.  smaller with I-131 rx Post-I-131 hypothyroidism.  She needs rx  Patient is advised the following: Patient Instructions  i have sent a prescription to your pharmacy, for a thyroid hormone pill.  You may need this for the rest of your life Please recheck the thyroid blood test in 1 month, here in our office.  a "lumpy thyroid" may eventually become overactive again.  this is usually a slow process, happening over the span of many years. Please return in 1 year.

## 2015-04-23 NOTE — Patient Instructions (Addendum)
i have sent a prescription to your pharmacy, for a thyroid hormone pill.  You may need this for the rest of your life Please recheck the thyroid blood test in 1 month, here in our office.  a "lumpy thyroid" may eventually become overactive again.  this is usually a slow process, happening over the span of many years. Please return in 1 year.

## 2015-04-30 ENCOUNTER — Encounter: Payer: Self-pay | Admitting: *Deleted

## 2015-04-30 ENCOUNTER — Ambulatory Visit (INDEPENDENT_AMBULATORY_CARE_PROVIDER_SITE_OTHER): Payer: PRIVATE HEALTH INSURANCE | Admitting: Cardiology

## 2015-04-30 ENCOUNTER — Encounter: Payer: Self-pay | Admitting: Cardiology

## 2015-04-30 VITALS — BP 140/86 | HR 87 | Ht 66.0 in | Wt 235.0 lb

## 2015-04-30 DIAGNOSIS — E032 Hypothyroidism due to medicaments and other exogenous substances: Secondary | ICD-10-CM

## 2015-04-30 DIAGNOSIS — Z8639 Personal history of other endocrine, nutritional and metabolic disease: Secondary | ICD-10-CM

## 2015-04-30 DIAGNOSIS — R7302 Impaired glucose tolerance (oral): Secondary | ICD-10-CM | POA: Diagnosis not present

## 2015-04-30 DIAGNOSIS — R072 Precordial pain: Secondary | ICD-10-CM

## 2015-04-30 DIAGNOSIS — R0602 Shortness of breath: Secondary | ICD-10-CM

## 2015-04-30 DIAGNOSIS — I10 Essential (primary) hypertension: Secondary | ICD-10-CM | POA: Diagnosis not present

## 2015-04-30 NOTE — Patient Instructions (Signed)
Your physician recommends that you schedule a follow-up appointment in: After Stress Test   Your physician recommends that you continue on your current medications as directed. Please refer to the Current Medication list given to you today.  Your physician has requested that you have en exercise stress myoview. For further information please visit [email or web address]. Please follow instruction sheet, as given.   Thank you for choosing [facility]!

## 2015-04-30 NOTE — Progress Notes (Signed)
Cardiology Office Note  Date: 04/30/2015   ID: Tracy Joseph, DOB 1971-05-11, MRN [id number]  PCP: Tula Nakayama, MD  Primary Cardiologist: Rozann Lesches, MD   Chief Complaint  Patient presents with  . Chest Pain    History of Present Illness: Tracy Joseph is a 44 y.o. female referred for cardiology consultation by Dr. Moshe Cipro. I reviewed her chart. She presents reporting a 2 to three-month history of exertional shortness of breath and fatigue, also a feeling of mild chest tightness sometimes with activity. This is particularly noted when she walks uphill or up stairs.   She was recently found to be hypothyroid and just started on Synthroid with TSH of 39.35. She has a history of goiter and previous hyperthyroidism treated with radioactive iodine.  She reports generally reasonable blood pressure control over time, noted to be prediabetic as well with recent hemoglobin A1c 6.0. She also reports a family history of premature CAD in maternal aunts. In addition Dr. Moshe Cipro is referring her for further assessment of possible obstructive sleep apnea.  I reviewed her recent ECG from April, outlined below. A remote echocardiogram from 2005 was reviewed as well. She has not had any ischemic testing.   Past Medical History  Diagnosis Date  . Obesity   . Essential hypertension   . Goiter     Initially hyperthyroid treated with RAI, now hypothyroid - follows with Dr. [name]  . Prediabetes   . Tubular adenoma of colon   . Seasonal allergies   . Obstructive sleep apnea     Suspected     Past Surgical History  Procedure Laterality Date  . Tubal ligation  2005    Current Outpatient Prescriptions  Medication Sig Dispense Refill  . amLODipine (NORVASC) 10 MG tablet Take 1 tablet (10 mg total) by mouth daily. 30 tablet 3  . hydrALAZINE (APRESOLINE) 100 MG tablet Take 1 tablet (100 mg total) by mouth 2 (two) times daily. 60 tablet 3  . levothyroxine (SYNTHROID, LEVOTHROID)  100 MCG tablet Take 1 tablet (100 mcg total) by mouth daily before breakfast. 30 tablet 11  . triamterene-hydrochlorothiazide (MAXZIDE-25) 37.5-25 MG per tablet Take 1 tablet by mouth daily. 30 tablet 3   No current facility-administered medications for this visit.    Allergies:  Ace inhibitors; Dust mite extract; and Pollen extract   Social History: The patient  reports that she has never smoked. She does not have any smokeless tobacco history on file. She reports that she does not drink alcohol or use illicit drugs.   Family History: The patient's family history includes Coronary artery disease in her maternal aunt and maternal aunt; Hyperlipidemia in her mother; Hypertension in her mother; Thyroid disease in her paternal grandmother.   ROS:  Please see the history of present illness. Otherwise, complete review of systems is positive for fatigue during the daytime, also reported snoring. Heavy menses. All other systems are reviewed and negative.   Physical Exam: VS:  BP 140/86 mmHg  Pulse 87  Ht 5\' 6"  (1.676 m)  Wt 235 lb (106.595 kg)  BMI 37.95 kg/m2  SpO2 96%  LMP 04/26/2015, BMI Body mass index is 37.95 kg/(m^2).  Wt Readings from Last 3 Encounters:  04/30/15 235 lb (106.595 kg)  04/06/15 235 lb 1.9 oz (106.65 kg)  12/30/14 230 lb 1.3 oz (104.364 kg)     General: Overweight woman, appears comfortable at rest. HEENT: Conjunctiva and lids normal, oropharynx clear. Neck: Supple, increased girth without obvious elevated JVP  or carotid bruits, no thyromegaly. Lungs: Clear to auscultation, nonlabored breathing at rest. Cardiac: Regular rate and rhythm, no S3 or significant systolic murmur, no pericardial rub. Abdomen: Soft, nontender,  bowel sounds present. Extremities: No pitting edema, distal pulses 2+. Skin: Warm and dry. Musculoskeletal: No kyphosis. Neuropsychiatric: Alert and oriented x3, affect grossly appropriate.   ECG: Tracing from 04/06/2015 reviewed finding sinus  rhythm with nonspecific T-wave changes and poor anterior R-wave progression. No old tracing available.  Recent Labwork: 04/06/2015: ALT <8; AST 16; BUN 11; Creatinine 0.99; Hemoglobin 7.5*; Platelets 442*; Potassium 3.7; Sodium 136; TSH 39.351*     Component Value Date/Time   CHOL 194 04/06/2015 1717   TRIG 124 04/06/2015 1717   HDL 57 04/06/2015 1717   CHOLHDL 3.4 04/06/2015 1717   VLDL 25 04/06/2015 1717   LDLCALC 112* 04/06/2015 1717    Other Studies Reviewed Today:  Echocardiogram 08/29/2004: SUMMARY - Overall left ventricular systolic function was normal. Left    ventricular ejection fraction was estimated , range being 55    % to 65 %.. There were no left ventricular regional wall    motion abnormalities. Left ventricular wall thickness was    moderately increased. - The left atrium was mildly dilated.   ASSESSMENT AND PLAN:  1. Two-three month history of exertional shortness of breath, fatigue, intermittent mild chest tightness. This is noted in the setting of hypothyroidism with recent initiation of Synthroid as outlined above, although with cardiac risk factors including obesity, hypertension, impaired glucose tolerance, and some family history of premature CAD. Her ECG is abnormal but nonspecific. She has not undergone any formal ischemic testing. We will arrange an exercise Cardiolite for further evaluation.  2. Essential hypertension. No changes made to current regimen.  3. Impaired glucose tolerance, recent hemoglobin A1c 6.0.  4. LDL 112, no specific medical therapy at this time.  5. History of goiter and previously documented hyperthyroidism status post radioactive iodine treatment, subsequent hypothyroidism, now started on Synthroid per Dr. [name].  Current medicines were reviewed at length with the patient today.   Orders Placed This Encounter  Procedures  . NM Myocar Multi W/Spect W/Wall Motion / EF  . Myocardial Perfusion Imaging     Disposition: Call with results.   Signed, Satira Sark, MD, [facility] 04/30/2015 9:14 AM    [facility] [place]. [address] [place], [place] [address] Phone: [phone]; Fax: [phone]

## 2015-05-03 ENCOUNTER — Ambulatory Visit (INDEPENDENT_AMBULATORY_CARE_PROVIDER_SITE_OTHER): Payer: PRIVATE HEALTH INSURANCE | Admitting: Internal Medicine

## 2015-05-03 ENCOUNTER — Inpatient Hospital Stay (HOSPITAL_COMMUNITY): Admission: RE | Admit: 2015-05-03 | Payer: PRIVATE HEALTH INSURANCE | Source: Ambulatory Visit

## 2015-05-06 ENCOUNTER — Encounter (HOSPITAL_COMMUNITY): Payer: PRIVATE HEALTH INSURANCE

## 2015-05-06 ENCOUNTER — Ambulatory Visit (HOSPITAL_COMMUNITY): Payer: PRIVATE HEALTH INSURANCE

## 2015-05-10 ENCOUNTER — Ambulatory Visit (HOSPITAL_COMMUNITY): Payer: PRIVATE HEALTH INSURANCE

## 2015-05-19 ENCOUNTER — Encounter (INDEPENDENT_AMBULATORY_CARE_PROVIDER_SITE_OTHER): Payer: Self-pay | Admitting: *Deleted

## 2015-05-25 ENCOUNTER — Other Ambulatory Visit: Payer: PRIVATE HEALTH INSURANCE

## 2015-06-01 ENCOUNTER — Ambulatory Visit: Payer: PRIVATE HEALTH INSURANCE | Admitting: Family Medicine

## 2015-06-16 ENCOUNTER — Ambulatory Visit: Payer: PRIVATE HEALTH INSURANCE | Admitting: Family Medicine

## 2015-06-18 ENCOUNTER — Telehealth: Payer: Self-pay | Admitting: Family Medicine

## 2015-06-18 NOTE — Telephone Encounter (Signed)
pls contact pt and schedule f/u week of August 22, she will need labs prior to visit , non fast, I will send nurse a flag  to mail her lab order to be done the week before  Also let her know I am concerned as she has not f/u on 2 very important referrals, Dr Laural Golden for colonoscopy and also for gyne   I tried to call her , no answer, and did not leave a message

## 2015-06-22 NOTE — Telephone Encounter (Signed)
Appointment 8.23.2016 at 8:45

## 2015-06-22 NOTE — Telephone Encounter (Signed)
Left message to call back at [phone]

## 2015-06-22 NOTE — Telephone Encounter (Signed)
Left message for patient to call back  

## 2015-08-10 ENCOUNTER — Encounter: Payer: Self-pay | Admitting: Family Medicine

## 2015-08-10 ENCOUNTER — Ambulatory Visit (INDEPENDENT_AMBULATORY_CARE_PROVIDER_SITE_OTHER): Payer: PRIVATE HEALTH INSURANCE | Admitting: Family Medicine

## 2015-08-10 VITALS — BP 144/98 | HR 88 | Resp 16 | Ht 66.0 in | Wt 231.0 lb

## 2015-08-10 DIAGNOSIS — I10 Essential (primary) hypertension: Secondary | ICD-10-CM | POA: Diagnosis not present

## 2015-08-10 DIAGNOSIS — R7302 Impaired glucose tolerance (oral): Secondary | ICD-10-CM | POA: Diagnosis not present

## 2015-08-10 DIAGNOSIS — R7309 Other abnormal glucose: Secondary | ICD-10-CM

## 2015-08-10 DIAGNOSIS — D126 Benign neoplasm of colon, unspecified: Secondary | ICD-10-CM

## 2015-08-10 DIAGNOSIS — R7301 Impaired fasting glucose: Secondary | ICD-10-CM

## 2015-08-10 DIAGNOSIS — R7303 Prediabetes: Secondary | ICD-10-CM

## 2015-08-10 DIAGNOSIS — D509 Iron deficiency anemia, unspecified: Secondary | ICD-10-CM | POA: Diagnosis not present

## 2015-08-10 DIAGNOSIS — E559 Vitamin D deficiency, unspecified: Secondary | ICD-10-CM | POA: Diagnosis not present

## 2015-08-10 DIAGNOSIS — IMO0001 Reserved for inherently not codable concepts without codable children: Secondary | ICD-10-CM

## 2015-08-10 DIAGNOSIS — E890 Postprocedural hypothyroidism: Secondary | ICD-10-CM

## 2015-08-10 LAB — COMPLETE METABOLIC PANEL WITH GFR
ALBUMIN: 4.1 g/dL (ref 3.6–5.1)
ALK PHOS: 78 U/L (ref 33–115)
ALT: 7 U/L (ref 6–29)
AST: 15 U/L (ref 10–35)
BUN: 12 mg/dL (ref 7–25)
CALCIUM: 9.3 mg/dL (ref 8.6–10.2)
CHLORIDE: 100 mmol/L (ref 98–110)
CO2: 25 mmol/L (ref 20–31)
CREATININE: 0.97 mg/dL (ref 0.50–1.10)
GFR, EST AFRICAN AMERICAN: 82 mL/min (ref >=60)
GFR, Est Non African American: 71 mL/min (ref >=60)
Glucose, Bld: 100 mg/dL — ABNORMAL HIGH (ref 65–99)
Potassium: 3.7 mmol/L (ref 3.5–5.3)
Sodium: 135 mmol/L (ref 135–146)
Total Bilirubin: 0.4 mg/dL (ref 0.2–1.2)
Total Protein: 8 g/dL (ref 6.1–8.1)

## 2015-08-10 LAB — CBC
HCT: 30 % — ABNORMAL LOW (ref 36.0–46.0)
Hemoglobin: 9 g/dL — ABNORMAL LOW (ref 12.0–15.0)
MCH: 20.8 pg — AB (ref 26.0–34.0)
MCHC: 30 g/dL (ref 30.0–36.0)
MCV: 69.3 fL — AB (ref 78.0–100.0)
MPV: 8.7 fL (ref 8.6–12.4)
Platelets: 449 10*3/uL — ABNORMAL HIGH (ref 150–400)
RBC: 4.33 MIL/uL (ref 3.87–5.11)
RDW: 19 % — ABNORMAL HIGH (ref 11.5–15.5)
WBC: 8.8 10*3/uL (ref 4.0–10.5)

## 2015-08-10 LAB — HEMOGLOBIN A1C
HEMOGLOBIN A1C: 6.3 % — AB (ref <5.7)
Mean Plasma Glucose: 134 mg/dL — ABNORMAL HIGH (ref <117)

## 2015-08-10 LAB — IRON: IRON: 19 ug/dL — AB (ref 42–145)

## 2015-08-10 LAB — FERRITIN: Ferritin: 7 ng/mL — ABNORMAL LOW (ref 10–291)

## 2015-08-10 NOTE — Patient Instructions (Addendum)
F/u in 4 month  Nurse BP re check in 2 weeks  Take medication as prescribed, BP is high and is damaging your body   %0% meal vegetable, or friuit fresh / frozen,   30 mins every day exrcise for health   CBC, iron and ferritin, HBA1C, chem 7 and EGFR, spk directly with nurse in am re labs from today   Start OTC vit D3 800 IU once daily  Rectal today  DASH Eating Plan DASH stands for "Dietary Approaches to Stop Hypertension." The DASH eating plan is a healthy eating plan that has been shown to reduce high blood pressure (hypertension). Additional health benefits may include reducing the risk of type 2 diabetes mellitus, heart disease, and stroke. The DASH eating plan may also help with weight loss. WHAT DO I NEED TO KNOW ABOUT THE DASH EATING PLAN? For the DASH eating plan, you will follow these general guidelines:  Choose foods with a percent daily value for sodium of less than 5% (as listed on the food label).  Use salt-free seasonings or herbs instead of table salt or sea salt.  Check with your health care provider or pharmacist before using salt substitutes.  Eat lower-sodium products, often labeled as "lower sodium" or "no salt added."  Eat fresh foods.  Eat more vegetables, fruits, and low-fat dairy products.  Choose whole grains. Look for the word "whole" as the first word in the ingredient list.  Choose fish and skinless chicken or [place] more often than red meat. Limit fish, poultry, and meat to 6 oz (170 g) each day.  Limit sweets, desserts, sugars, and sugary drinks.  Choose heart-healthy fats.  Limit cheese to 1 oz (28 g) per day.  Eat more home-cooked food and less restaurant, buffet, and fast food.  Limit fried foods.  Cook foods using methods other than frying.  Limit canned vegetables. If you do use them, rinse them well to decrease the sodium.  When eating at a restaurant, ask that your food be prepared with less salt, or no salt if possible. WHAT  FOODS CAN I EAT? Seek help from a dietitian for individual calorie needs. Grains Whole grain or whole wheat bread. Brown rice. Whole grain or whole wheat pasta. Quinoa, bulgur, and whole grain cereals. Low-sodium cereals. Corn or whole wheat flour tortillas. Whole grain cornbread. Whole grain crackers. Low-sodium crackers. Vegetables Fresh or frozen vegetables (raw, steamed, roasted, or grilled). Low-sodium or reduced-sodium tomato and vegetable juices. Low-sodium or reduced-sodium tomato sauce and paste. Low-sodium or reduced-sodium canned vegetables.  Fruits All fresh, canned (in natural juice), or frozen fruits. Meat and Other Protein Products Ground beef (85% or leaner), grass-fed beef, or beef trimmed of fat. Skinless chicken or [place]. Ground chicken or [place]. Pork trimmed of fat. All fish and seafood. Eggs. Dried beans, peas, or lentils. Unsalted nuts and seeds. Unsalted canned beans. Dairy Low-fat dairy products, such as skim or 1% milk, 2% or reduced-fat cheeses, low-fat ricotta or cottage cheese, or plain low-fat yogurt. Low-sodium or reduced-sodium cheeses. Fats and Oils Tub margarines without trans fats. Light or reduced-fat mayonnaise and salad dressings (reduced sodium). Avocado. Safflower, olive, or canola oils. Natural peanut or almond butter. Other Unsalted popcorn and pretzels. The items listed above may not be a complete list of recommended foods or beverages. Contact your dietitian for more options. WHAT FOODS ARE NOT RECOMMENDED? Grains White bread. White pasta. White rice. Refined cornbread. Bagels and croissants. Crackers that contain trans fat. Vegetables Creamed or fried vegetables.  Vegetables in a cheese sauce. Regular canned vegetables. Regular canned tomato sauce and paste. Regular tomato and vegetable juices. Fruits Dried fruits. Canned fruit in light or heavy syrup. Fruit juice. Meat and Other Protein Products Fatty cuts of meat. Ribs, chicken wings, bacon,  sausage, bologna, salami, chitterlings, fatback, hot dogs, bratwurst, and packaged luncheon meats. Salted nuts and seeds. Canned beans with salt. Dairy Whole or 2% milk, cream, half-and-half, and cream cheese. Whole-fat or sweetened yogurt. Full-fat cheeses or blue cheese. Nondairy creamers and whipped toppings. Processed cheese, cheese spreads, or cheese curds. Condiments Onion and garlic salt, seasoned salt, table salt, and sea salt. Canned and packaged gravies. Worcestershire sauce. Tartar sauce. Barbecue sauce. Teriyaki sauce. Soy sauce, including reduced sodium. Steak sauce. Fish sauce. Oyster sauce. Cocktail sauce. Horseradish. Ketchup and mustard. Meat flavorings and tenderizers. Bouillon cubes. Hot sauce. Tabasco sauce. Marinades. Taco seasonings. Relishes. Fats and Oils Butter, stick margarine, lard, shortening, ghee, and bacon fat. Coconut, palm kernel, or palm oils. Regular salad dressings. Other Pickles and olives. Salted popcorn and pretzels. The items listed above may not be a complete list of foods and beverages to avoid. Contact your dietitian for more information. WHERE CAN I FIND MORE INFORMATION? National Heart, Lung, and Blood Institute: travelstabloid.com Document Released: 11/23/2011 Document Revised: 04/20/2014 Document Reviewed: 10/08/2013 [facility] Patient Information 2015 [place], [place]. This information is not intended to replace advice given to you by your health care provider. Make sure you discuss any questions you have with your health care provider. Hypertension Hypertension, commonly called high blood pressure, is when the force of blood pumping through your arteries is too strong. Your arteries are the blood vessels that carry blood from your heart throughout your body. A blood pressure reading consists of a higher number over a lower number, such as 110/72. The higher number (systolic) is the pressure inside your arteries when your  heart pumps. The lower number (diastolic) is the pressure inside your arteries when your heart relaxes. Ideally you want your blood pressure below 120/80. Hypertension forces your heart to work harder to pump blood. Your arteries may become narrow or stiff. Having hypertension puts you at risk for heart disease, stroke, and other problems.  RISK FACTORS Some risk factors for high blood pressure are controllable. Others are not.  Risk factors you cannot control include:   Race. You may be at higher risk if you are African American.  Age. Risk increases with age.  Gender. Men are at higher risk than women before age 39 years. After age 30, women are at higher risk than men. Risk factors you can control include:  Not getting enough exercise or physical activity.  Being overweight.  Getting too much fat, sugar, calories, or salt in your diet.  Drinking too much alcohol. SIGNS AND SYMPTOMS Hypertension does not usually cause signs or symptoms. Extremely high blood pressure (hypertensive crisis) may cause headache, anxiety, shortness of breath, and nosebleed. DIAGNOSIS  To check if you have hypertension, your health care provider will measure your blood pressure while you are seated, with your arm held at the level of your heart. It should be measured at least twice using the same arm. Certain conditions can cause a difference in blood pressure between your right and left arms. A blood pressure reading that is higher than normal on one occasion does not mean that you need treatment. If one blood pressure reading is high, ask your health care provider about having it checked again. TREATMENT  Treating high blood  pressure includes making lifestyle changes and possibly taking medicine. Living a healthy lifestyle can help lower high blood pressure. You may need to change some of your habits. Lifestyle changes may include:  Following the DASH diet. This diet is high in fruits, vegetables, and whole  grains. It is low in salt, red meat, and added sugars.  Getting at least 2 hours of brisk physical activity every week.  Losing weight if necessary.  Not smoking.  Limiting alcoholic beverages.  Learning ways to reduce stress. If lifestyle changes are not enough to get your blood pressure under control, your health care provider may prescribe medicine. You may need to take more than one. Work closely with your health care provider to understand the risks and benefits. HOME CARE INSTRUCTIONS  Have your blood pressure rechecked as directed by your health care provider.   Take medicines only as directed by your health care provider. Follow the directions carefully. Blood pressure medicines must be taken as prescribed. The medicine does not work as well when you skip doses. Skipping doses also puts you at risk for problems.   Do not smoke.   Monitor your blood pressure at home as directed by your health care provider. SEEK MEDICAL CARE IF:   You think you are having a reaction to medicines taken.  You have recurrent headaches or feel dizzy.  You have swelling in your ankles.  You have trouble with your vision. SEEK IMMEDIATE MEDICAL CARE IF:  You develop a severe headache or confusion.  You have unusual weakness, numbness, or feel faint.  You have severe chest or abdominal pain.  You vomit repeatedly.  You have trouble breathing. MAKE SURE YOU:   Understand these instructions.  Will watch your condition.  Will get help right away if you are not doing well or get worse. Document Released: 12/04/2005 Document Revised: 04/20/2014 Document Reviewed: 09/26/2013 [facility] Patient Information 2015 [place], [place]. This information is not intended to replace advice given to you by your health care provider. Make sure you discuss any questions you have with your health care provider.     Get mammogram

## 2015-08-24 NOTE — Assessment & Plan Note (Signed)
rept study past due pt needs to have this done

## 2015-08-24 NOTE — Progress Notes (Signed)
Subjective:    Patient ID: Tracy Joseph, female    DOB: 1971/08/09, 44 y.o.   MRN: [id number]  HPI The PT is here for follow up and re-evaluation of chronic medical conditions, medication management and review of any available recent lab and radiology data.  Preventive health is updated, specifically  Cancer screening and Immunization.   Questions or concerns regarding consultations or procedures which the PT has had in the interim are  Addressed.still has mammogram and rept colonoscopy outstanding The PT denies any adverse reactions to current medications since the last visit. Not taking meds on regular schedule BP still uncontrolled unfortunately There are no new concerns.  There are no specific complaints       Review of Systems See HPI Denies recent fever or chills. Denies sinus pressure, nasal congestion, ear pain or sore throat. Denies chest congestion, productive cough or wheezing. Denies chest pains, palpitations and leg swelling Denies abdominal pain, nausea, vomiting,diarrhea or constipation.   Denies dysuria, frequency, hesitancy or incontinence. Denies joint pain, swelling and limitation in mobility. Denies uncontrolled  headaches, seizures, numbness, or tingling. Denies depression, anxiety or insomnia. Denies skin break down or rash.        Objective:   Physical Exam  BP 144/98 mmHg  Pulse 88  Resp 16  Ht 5\' 6"  (1.676 m)  Wt 231 lb (104.781 kg)  BMI 37.30 kg/m2  SpO2 97% Patient alert and oriented and in no cardiopulmonary distress.  HEENT: No facial asymmetry, EOMI,   oropharynx pink and moist.  Neck supple no JVD, no mass.  Chest: Clear to auscultation bilaterally.  CVS: S1, S2 no murmurs, no S3.Regular rate.  ABD: Soft non tender.   Ext: No edema  MS: Adequate ROM spine, shoulders, hips and knees.  Skin: Intact, no ulcerations or rash noted.  Psych: Good eye contact, normal affect. Memory intact not anxious or depressed  appearing.  CNS: CN 2-12 intact, power,  normal throughout.no focal deficits noted.       Assessment & Plan:  Essential hypertension, malignant Uncontrolled, med compliance is stressed DASH diet and commitment to daily physical activity for a minimum of 30 minutes discussed and encouraged, as a part of hypertension management. The importance of attaining a healthy weight is also discussed.  BP/Weight 08/10/2015 04/30/2015 04/23/2015 04/06/2015 12/30/2014 10/30/2013 40/12/270  Systolic BP 536 644 034 742 595 638 756  Diastolic BP 98 86 90 88 88 110 120  Wt. (Lbs) 231 235 - 235.12 230.08 221.8 226  BMI 37.3 37.95 - 37.97 37.15 36.34 37.02      Re check in 2 weeks  Obesity, Class II, BMI 35.0-39.9, with comorbidity (see actual BMI) Improved Patient re-educated about  the importance of commitment to a  minimum of 150 minutes of exercise per week.  The importance of healthy food choices with portion control discussed. Encouraged to start a food diary, count calories and to consider  joining a support group. Sample diet sheets offered. Goals set by the patient for the next several months.   Weight /BMI 08/10/2015 04/30/2015 04/06/2015  WEIGHT 231 lb 235 lb 235 lb 1.9 oz  HEIGHT 5\' 6"  5\' 6"  5\' 6"   BMI 37.3 kg/m2 37.95 kg/m2 37.97 kg/m2    Current exercise per week 60 minutes.   Hypothyroidism following radioiodine therapy Uncontrolled , needs to see endo for management  Tubular adenoma of colon rept study past due pt needs to have this done  IMPAIRED FASTING GLUCOSE Patient educated about the  importance of limiting  Carbohydrate intake , the need to commit to daily physical activity for a minimum of 30 minutes , and to commit weight loss. The fact that changes in all these areas will reduce or eliminate all together the development of diabetes is stressed.  Deteriorated Updated lab needed at/ before next visit.   Diabetic Labs Latest Ref Rng 08/10/2015 04/06/2015 01/04/2011  02/01/2009 02/03/2008  HbA1c <5.7 % 6.3(H) 6.0(H) 6.2(H) - -  Chol 0 - 200 mg/dL - 194 150 176 160  HDL >=46 mg/dL - 57 41 52 44  Calc LDL 0 - 99 mg/dL - 112(H) 89 106(H) 97  Triglycerides <150 mg/dL - 124 102 91 95  Creatinine 0.50 - 1.10 mg/dL 0.97 0.99 0.87 1.05 0.91   BP/Weight 08/10/2015 04/30/2015 04/23/2015 04/06/2015 12/30/2014 10/30/2013 37/01/9020  Systolic BP 115 520 802 233 612 244 975  Diastolic BP 98 86 90 88 88 110 120  Wt. (Lbs) 231 235 - 235.12 230.08 221.8 226  BMI 37.3 37.95 - 37.97 37.15 36.34 37.02   No flowsheet data found.     Iron deficiency anemia Updated lab shows still deficient still importance of supplemenation and gyne eval streessed

## 2015-08-24 NOTE — Assessment & Plan Note (Addendum)
Uncontrolled, med compliance is stressed DASH diet and commitment to daily physical activity for a minimum of 30 minutes discussed and encouraged, as a part of hypertension management. The importance of attaining a healthy weight is also discussed.  BP/Weight 08/10/2015 04/30/2015 04/23/2015 04/06/2015 12/30/2014 10/30/2013 56/01/1307  Systolic BP 657 846 962 952 841 324 401  Diastolic BP 98 86 90 88 88 110 120  Wt. (Lbs) 231 235 - 235.12 230.08 221.8 226  BMI 37.3 37.95 - 37.97 37.15 36.34 37.02      Re check in 2 weeks

## 2015-08-24 NOTE — Assessment & Plan Note (Signed)
Updated lab shows still deficient still importance of supplemenation and gyne eval streessed

## 2015-08-24 NOTE — Assessment & Plan Note (Signed)
Patient educated about the importance of limiting  Carbohydrate intake , the need to commit to daily physical activity for a minimum of 30 minutes , and to commit weight loss. The fact that changes in all these areas will reduce or eliminate all together the development of diabetes is stressed.  Deteriorated Updated lab needed at/ before next visit.   Diabetic Labs Latest Ref Rng 08/10/2015 04/06/2015 01/04/2011 02/01/2009 02/03/2008  HbA1c <5.7 % 6.3(H) 6.0(H) 6.2(H) - -  Chol 0 - 200 mg/dL - 194 150 176 160  HDL >=46 mg/dL - 57 41 52 44  Calc LDL 0 - 99 mg/dL - 112(H) 89 106(H) 97  Triglycerides <150 mg/dL - 124 102 91 95  Creatinine 0.50 - 1.10 mg/dL 0.97 0.99 0.87 1.05 0.91   BP/Weight 08/10/2015 04/30/2015 04/23/2015 04/06/2015 12/30/2014 10/30/2013 90/02/91  Systolic BP 330 076 226 333 545 625 638  Diastolic BP 98 86 90 88 88 110 120  Wt. (Lbs) 231 235 - 235.12 230.08 221.8 226  BMI 37.3 37.95 - 37.97 37.15 36.34 37.02   No flowsheet data found.

## 2015-08-24 NOTE — Assessment & Plan Note (Signed)
Improved Patient re-educated about  the importance of commitment to a  minimum of 150 minutes of exercise per week.  The importance of healthy food choices with portion control discussed. Encouraged to start a food diary, count calories and to consider  joining a support group. Sample diet sheets offered. Goals set by the patient for the next several months.   Weight /BMI 08/10/2015 04/30/2015 04/06/2015  WEIGHT 231 lb 235 lb 235 lb 1.9 oz  HEIGHT 5\' 6"  5\' 6"  5\' 6"   BMI 37.3 kg/m2 37.95 kg/m2 37.97 kg/m2    Current exercise per week 60 minutes.

## 2015-08-24 NOTE — Assessment & Plan Note (Signed)
Uncontrolled , needs to see endo for management

## 2015-09-23 ENCOUNTER — Other Ambulatory Visit: Payer: Self-pay | Admitting: Family Medicine

## 2015-09-28 ENCOUNTER — Other Ambulatory Visit: Payer: Self-pay | Admitting: Family Medicine

## 2015-09-30 ENCOUNTER — Other Ambulatory Visit: Payer: Self-pay | Admitting: Family Medicine

## 2015-09-30 DIAGNOSIS — Z1231 Encounter for screening mammogram for malignant neoplasm of breast: Secondary | ICD-10-CM

## 2015-10-29 ENCOUNTER — Ambulatory Visit (HOSPITAL_COMMUNITY): Payer: PRIVATE HEALTH INSURANCE

## 2015-11-22 ENCOUNTER — Other Ambulatory Visit: Payer: Self-pay

## 2015-11-22 MED ORDER — AMLODIPINE BESYLATE 10 MG PO TABS: ORAL_TABLET | ORAL | Status: DC

## 2015-12-15 ENCOUNTER — Encounter: Payer: Self-pay | Admitting: Family Medicine

## 2015-12-15 ENCOUNTER — Ambulatory Visit (INDEPENDENT_AMBULATORY_CARE_PROVIDER_SITE_OTHER): Payer: BLUE CROSS/BLUE SHIELD | Admitting: Family Medicine

## 2015-12-15 VITALS — BP 158/102 | HR 87 | Resp 16 | Ht 66.0 in | Wt 237.0 lb

## 2015-12-15 DIAGNOSIS — R7301 Impaired fasting glucose: Secondary | ICD-10-CM

## 2015-12-15 DIAGNOSIS — I10 Essential (primary) hypertension: Secondary | ICD-10-CM

## 2015-12-15 DIAGNOSIS — G4733 Obstructive sleep apnea (adult) (pediatric): Secondary | ICD-10-CM

## 2015-12-15 DIAGNOSIS — R0683 Snoring: Secondary | ICD-10-CM

## 2015-12-15 DIAGNOSIS — E890 Postprocedural hypothyroidism: Secondary | ICD-10-CM

## 2015-12-15 DIAGNOSIS — IMO0001 Reserved for inherently not codable concepts without codable children: Secondary | ICD-10-CM

## 2015-12-15 DIAGNOSIS — D509 Iron deficiency anemia, unspecified: Secondary | ICD-10-CM

## 2015-12-15 LAB — CBC
HEMATOCRIT: 34.1 % — AB (ref 36.0–46.0)
Hemoglobin: 10.7 g/dL — ABNORMAL LOW (ref 12.0–15.0)
MCH: 24 pg — AB (ref 26.0–34.0)
MCHC: 31.4 g/dL (ref 30.0–36.0)
MCV: 76.6 fL — AB (ref 78.0–100.0)
MPV: 9.1 fL (ref 8.6–12.4)
Platelets: 379 10*3/uL (ref 150–400)
RBC: 4.45 MIL/uL (ref 3.87–5.11)
RDW: 17.7 % — AB (ref 11.5–15.5)
WBC: 10.8 10*3/uL — ABNORMAL HIGH (ref 4.0–10.5)

## 2015-12-15 LAB — BASIC METABOLIC PANEL
BUN: 8 mg/dL (ref 7–25)
CHLORIDE: 101 mmol/L (ref 98–110)
CO2: 25 mmol/L (ref 20–31)
CREATININE: 0.88 mg/dL (ref 0.50–1.10)
Calcium: 9.2 mg/dL (ref 8.6–10.2)
Glucose, Bld: 91 mg/dL (ref 65–99)
Potassium: 4.3 mmol/L (ref 3.5–5.3)
Sodium: 139 mmol/L (ref 135–146)

## 2015-12-15 LAB — HEMOGLOBIN A1C
Hgb A1c MFr Bld: 6.3 % — ABNORMAL HIGH (ref <5.7)
Mean Plasma Glucose: 134 mg/dL — ABNORMAL HIGH (ref <117)

## 2015-12-15 LAB — IRON: IRON: 24 ug/dL — AB (ref 40–190)

## 2015-12-15 LAB — FERRITIN: FERRITIN: 10 ng/mL (ref 10–291)

## 2015-12-15 LAB — TSH: TSH: 13.323 u[IU]/mL — ABNORMAL HIGH (ref 0.350–4.500)

## 2015-12-15 MED ORDER — TRIAMTERENE-HCTZ 37.5-25 MG PO TABS: 1.0000 | ORAL_TABLET | Freq: Every day | ORAL | Status: DC

## 2015-12-15 NOTE — Progress Notes (Signed)
Subjective:    Patient ID: Tracy Joseph, female    DOB: December 10, 1971, 44 y.o.   MRN: [id number]  HPI   Tracy Joseph     MRN: [id number]      DOB: December 25, 1970   HPI Tracy Joseph is here for follow up and re-evaluation of chronic medical conditions, medication management and review of any available recent lab and radiology data.  Preventive health is updated, specifically  Cancer screening and Immunization.   Questions or concerns regarding consultations or procedures which the PT has had in the interim are  addressed. The PT denies any adverse reactions to current medications since the last visit.  There are no new concerns.  There are no specific complaints   ROS Denies recent fever or chills. Denies sinus pressure, nasal congestion, ear pain or sore throat. Denies chest congestion, productive cough or wheezing. Denies chest pains, palpitations and leg swelling Denies abdominal pain, nausea, vomiting,diarrhea or constipation.   Denies dysuria, frequency, hesitancy or incontinence. Denies joint pain, swelling and limitation in mobility. Denies headaches, seizures, numbness, or tingling. Denies depression, anxiety or insomnia. Denies skin break down or rash.   PE  BP 158/102 mmHg  Pulse 87  Resp 16  Ht 5\' 6"  (1.676 m)  Wt 237 lb (107.502 kg)  BMI 38.27 kg/m2  SpO2 99%  Patient alert and oriented and in no cardiopulmonary distress.  HEENT: No facial asymmetry, EOMI,   oropharynx pink and moist.  Neck supple no JVD, no mass.  Chest: Clear to auscultation bilaterally.  CVS: S1, S2 no murmurs, no S3.Regular rate.  ABD: Soft non tender.   Ext: No edema  MS: Adequate ROM spine, shoulders, hips and knees.  Skin: Intact, no ulcerations or rash noted.  Psych: Good eye contact, normal affect. Memory intact not anxious or depressed appearing.  CNS: CN 2-12 intact, power,  normal throughout.no focal deficits noted.   Assessment & Plan   Essential hypertension,  malignant Uncontrolled, out of maxzide for over 1 month DASH diet and commitment to daily physical activity for a minimum of 30 minutes discussed and encouraged, as a part of hypertension management. The importance of attaining a healthy weight is also discussed.  BP/Weight 12/15/2015 08/10/2015 04/30/2015 04/23/2015 04/06/2015 12/30/2014 123456  Systolic BP 0000000 123456 [id number]  Diastolic BP [id number] 98 86 90 88 88 110  Wt. (Lbs) 237 231 235 - 235.12 230.08 221.8  BMI 38.27 37.3 37.95 - 37.97 37.15 36.34      Nurse BP check in 6 weeks  Obstructive sleep apnea Excess snoring and fatigue needs sleep  eval will refer  Hypothyroidism following radioiodine therapy Improved , but still uncontrolled increase dose of synthroid  And ensutre pt compliance  Iron deficiency anemia Slight improvement  increase iron intake to 325 mg twice daily  Obesity, Class II, BMI 35.0-39.9, with comorbidity (see actual BMI) Deteriorated. Patient re-educated about  the importance of commitment to a  minimum of 150 minutes of exercise per week.  The importance of healthy food choices with portion control discussed. Encouraged to start a food diary, count calories and to consider  joining a support group. Sample diet sheets offered. Goals set by the patient for the next several months.   Weight /BMI 12/15/2015 08/10/2015 04/30/2015  WEIGHT 237 lb 231 lb 235 lb  HEIGHT 5\' 6"  5\' 6"  5\' 6"   BMI 38.27 kg/m2 37.3 kg/m2 37.95 kg/m2    Current exercise per week 60 minutes.  Review of Systems     Objective:   Physical Exam        Assessment & Plan:

## 2015-12-15 NOTE — Patient Instructions (Signed)
Nurse BP check in 6 weeks  CPE in 3.5 month  Fasting labs today  You are referred to Dr Merlene Laughter for sleep eval  Takle all 3 BP meds on schedule as prescribed   DASH Eating Plan DASH stands for "Dietary Approaches to Stop Hypertension." The DASH eating plan is a healthy eating plan that has been shown to reduce high blood pressure (hypertension). Additional health benefits may include reducing the risk of type 2 diabetes mellitus, heart disease, and stroke. The DASH eating plan may also help with weight loss. WHAT DO I NEED TO KNOW ABOUT THE DASH EATING PLAN? For the DASH eating plan, you will follow these general guidelines:  Choose foods with a percent daily value for sodium of less than 5% (as listed on the food label).  Use salt-free seasonings or herbs instead of table salt or sea salt.  Check with your health care provider or pharmacist before using salt substitutes.  Eat lower-sodium products, often labeled as "lower sodium" or "no salt added."  Eat fresh foods.  Eat more vegetables, fruits, and low-fat dairy products.  Choose whole grains. Look for the word "whole" as the first word in the ingredient list.  Choose fish and skinless chicken or [place] more often than red meat. Limit fish, poultry, and meat to 6 oz (170 g) each day.  Limit sweets, desserts, sugars, and sugary drinks.  Choose heart-healthy fats.  Limit cheese to 1 oz (28 g) per day.  Eat more home-cooked food and less restaurant, buffet, and fast food.  Limit fried foods.  Cook foods using methods other than frying.  Limit canned vegetables. If you do use them, rinse them well to decrease the sodium.  When eating at a restaurant, ask that your food be prepared with less salt, or no salt if possible. WHAT FOODS CAN I EAT? Seek help from a dietitian for individual calorie needs. Grains Whole grain or whole wheat bread. Brown rice. Whole grain or whole wheat pasta. Quinoa, bulgur, and whole grain  cereals. Low-sodium cereals. Corn or whole wheat flour tortillas. Whole grain cornbread. Whole grain crackers. Low-sodium crackers. Vegetables Fresh or frozen vegetables (raw, steamed, roasted, or grilled). Low-sodium or reduced-sodium tomato and vegetable juices. Low-sodium or reduced-sodium tomato sauce and paste. Low-sodium or reduced-sodium canned vegetables.  Fruits All fresh, canned (in natural juice), or frozen fruits. Meat and Other Protein Products Ground beef (85% or leaner), grass-fed beef, or beef trimmed of fat. Skinless chicken or [place]. Ground chicken or [place]. Pork trimmed of fat. All fish and seafood. Eggs. Dried beans, peas, or lentils. Unsalted nuts and seeds. Unsalted canned beans. Dairy Low-fat dairy products, such as skim or 1% milk, 2% or reduced-fat cheeses, low-fat ricotta or cottage cheese, or plain low-fat yogurt. Low-sodium or reduced-sodium cheeses. Fats and Oils Tub margarines without trans fats. Light or reduced-fat mayonnaise and salad dressings (reduced sodium). Avocado. Safflower, olive, or canola oils. Natural peanut or almond butter. Other Unsalted popcorn and pretzels. The items listed above may not be a complete list of recommended foods or beverages. Contact your dietitian for more options. WHAT FOODS ARE NOT RECOMMENDED? Grains White bread. White pasta. White rice. Refined cornbread. Bagels and croissants. Crackers that contain trans fat. Vegetables Creamed or fried vegetables. Vegetables in a cheese sauce. Regular canned vegetables. Regular canned tomato sauce and paste. Regular tomato and vegetable juices. Fruits Dried fruits. Canned fruit in light or heavy syrup. Fruit juice. Meat and Other Protein Products Fatty cuts of meat. Ribs, chicken  wings, bacon, sausage, bologna, salami, chitterlings, fatback, hot dogs, bratwurst, and packaged luncheon meats. Salted nuts and seeds. Canned beans with salt. Dairy Whole or 2% milk, cream, half-and-half, and  cream cheese. Whole-fat or sweetened yogurt. Full-fat cheeses or blue cheese. Nondairy creamers and whipped toppings. Processed cheese, cheese spreads, or cheese curds. Condiments Onion and garlic salt, seasoned salt, table salt, and sea salt. Canned and packaged gravies. Worcestershire sauce. Tartar sauce. Barbecue sauce. Teriyaki sauce. Soy sauce, including reduced sodium. Steak sauce. Fish sauce. Oyster sauce. Cocktail sauce. Horseradish. Ketchup and mustard. Meat flavorings and tenderizers. Bouillon cubes. Hot sauce. Tabasco sauce. Marinades. Taco seasonings. Relishes. Fats and Oils Butter, stick margarine, lard, shortening, ghee, and bacon fat. Coconut, palm kernel, or palm oils. Regular salad dressings. Other Pickles and olives. Salted popcorn and pretzels. The items listed above may not be a complete list of foods and beverages to avoid. Contact your dietitian for more information. WHERE CAN I FIND MORE INFORMATION? National Heart, Lung, and Blood Institute: travelstabloid.com   This information is not intended to replace advice given to you by your health care provider. Make sure you discuss any questions you have with your health care provider.   Document Released: 11/23/2011 Document Revised: 12/25/2014 Document Reviewed: 10/08/2013 Elsevier Interactive Patient Education [employer].

## 2015-12-16 NOTE — Assessment & Plan Note (Signed)
Improved , but still uncontrolled increase dose of synthroid  And ensutre pt compliance

## 2015-12-16 NOTE — Assessment & Plan Note (Signed)
Deteriorated. Patient re-educated about  the importance of commitment to a  minimum of 150 minutes of exercise per week.  The importance of healthy food choices with portion control discussed. Encouraged to start a food diary, count calories and to consider  joining a support group. Sample diet sheets offered. Goals set by the patient for the next several months.   Weight /BMI 12/15/2015 08/10/2015 04/30/2015  WEIGHT 237 lb 231 lb 235 lb  HEIGHT 5\' 6"  5\' 6"  5\' 6"   BMI 38.27 kg/m2 37.3 kg/m2 37.95 kg/m2    Current exercise per week 60 minutes.

## 2015-12-16 NOTE — Assessment & Plan Note (Signed)
Slight improvement  increase iron intake to 325 mg twice daily

## 2015-12-16 NOTE — Assessment & Plan Note (Signed)
Uncontrolled, out of maxzide for over 1 month DASH diet and commitment to daily physical activity for a minimum of 30 minutes discussed and encouraged, as a part of hypertension management. The importance of attaining a healthy weight is also discussed.  BP/Weight 12/15/2015 08/10/2015 04/30/2015 04/23/2015 04/06/2015 12/30/2014 123456  Systolic BP 0000000 123456 [id number] 0000000 [id number]  Diastolic BP A999333 98 86 90 88 88 110  Wt. (Lbs) 237 231 235 - 235.12 230.08 221.8  BMI 38.27 37.3 37.95 - 37.97 37.15 36.34      Nurse BP check in 6 weeks

## 2015-12-16 NOTE — Assessment & Plan Note (Signed)
Excess snoring and fatigue needs sleep  eval will refer

## 2015-12-21 MED ORDER — LEVOTHYROXINE SODIUM 125 MCG PO TABS: 125.0000 ug | ORAL_TABLET | Freq: Every day | ORAL | Status: DC

## 2015-12-21 NOTE — Addendum Note (Signed)
Addended by: Denman George B on: 12/21/2015 01:55 PM   Modules accepted: Orders

## 2016-01-03 ENCOUNTER — Ambulatory Visit (HOSPITAL_COMMUNITY)
Admission: RE | Admit: 2016-01-03 | Discharge: 2016-01-03 | Disposition: A | Discharge status: Home / Self Care | Payer: BLUE CROSS/BLUE SHIELD | Source: Ambulatory Visit | Attending: Family Medicine | Admitting: Family Medicine

## 2016-01-03 DIAGNOSIS — Z1231 Encounter for screening mammogram for malignant neoplasm of breast: Secondary | ICD-10-CM | POA: Diagnosis present

## 2016-01-07 ENCOUNTER — Other Ambulatory Visit: Payer: Self-pay | Admitting: Family Medicine

## 2016-01-07 DIAGNOSIS — R928 Other abnormal and inconclusive findings on diagnostic imaging of breast: Secondary | ICD-10-CM

## 2016-01-25 ENCOUNTER — Encounter (HOSPITAL_COMMUNITY): Payer: BLUE CROSS/BLUE SHIELD

## 2016-02-07 ENCOUNTER — Other Ambulatory Visit: Payer: Self-pay

## 2016-02-07 DIAGNOSIS — R928 Other abnormal and inconclusive findings on diagnostic imaging of breast: Secondary | ICD-10-CM

## 2016-02-08 ENCOUNTER — Ambulatory Visit (HOSPITAL_COMMUNITY)
Admission: RE | Admit: 2016-02-08 | Discharge: 2016-02-08 | Disposition: A | Discharge status: Home / Self Care | Payer: BLUE CROSS/BLUE SHIELD | Source: Ambulatory Visit | Attending: Family Medicine | Admitting: Family Medicine

## 2016-02-08 DIAGNOSIS — R928 Other abnormal and inconclusive findings on diagnostic imaging of breast: Secondary | ICD-10-CM | POA: Diagnosis not present

## 2016-02-08 DIAGNOSIS — N6001 Solitary cyst of right breast: Secondary | ICD-10-CM | POA: Insufficient documentation

## 2016-02-23 ENCOUNTER — Ambulatory Visit: Payer: BLUE CROSS/BLUE SHIELD

## 2016-02-23 ENCOUNTER — Other Ambulatory Visit: Payer: Self-pay

## 2016-02-23 VITALS — BP 150/102

## 2016-02-23 DIAGNOSIS — I10 Essential (primary) hypertension: Secondary | ICD-10-CM

## 2016-02-23 MED ORDER — HYDRALAZINE HCL 100 MG PO TABS: 100.0000 mg | ORAL_TABLET | Freq: Two times a day (BID) | ORAL | Status: DC

## 2016-02-23 NOTE — Patient Instructions (Addendum)
Keep appt for physical next month   Hydralazine 100 sent in to take twice daily (12 hours apart) - 8am and 8pm  Continue other prescribed medications

## 2016-02-23 NOTE — Progress Notes (Signed)
Sent in hydralazine per dr that was already on her list. Keep f/u appt next month

## 2016-03-22 ENCOUNTER — Encounter: Payer: Self-pay | Admitting: Family Medicine

## 2016-03-22 ENCOUNTER — Ambulatory Visit (INDEPENDENT_AMBULATORY_CARE_PROVIDER_SITE_OTHER): Payer: BLUE CROSS/BLUE SHIELD | Admitting: Family Medicine

## 2016-03-22 ENCOUNTER — Other Ambulatory Visit (HOSPITAL_COMMUNITY)
Admission: RE | Admit: 2016-03-22 | Discharge: 2016-03-22 | Disposition: A | Discharge status: Home / Self Care | Payer: BLUE CROSS/BLUE SHIELD | Source: Ambulatory Visit | Attending: Family Medicine | Admitting: Family Medicine

## 2016-03-22 VITALS — BP 160/100 | HR 84 | Resp 16 | Ht 66.0 in | Wt 237.0 lb

## 2016-03-22 DIAGNOSIS — I10 Essential (primary) hypertension: Secondary | ICD-10-CM

## 2016-03-22 DIAGNOSIS — Z9889 Other specified postprocedural states: Secondary | ICD-10-CM

## 2016-03-22 DIAGNOSIS — Z13220 Encounter for screening for lipoid disorders: Secondary | ICD-10-CM | POA: Diagnosis not present

## 2016-03-22 DIAGNOSIS — D126 Benign neoplasm of colon, unspecified: Secondary | ICD-10-CM

## 2016-03-22 DIAGNOSIS — Z1151 Encounter for screening for human papillomavirus (HPV): Secondary | ICD-10-CM | POA: Diagnosis not present

## 2016-03-22 DIAGNOSIS — Z01419 Encounter for gynecological examination (general) (routine) without abnormal findings: Secondary | ICD-10-CM | POA: Diagnosis present

## 2016-03-22 DIAGNOSIS — R7301 Impaired fasting glucose: Secondary | ICD-10-CM

## 2016-03-22 DIAGNOSIS — Z0000 Encounter for general adult medical examination without abnormal findings: Secondary | ICD-10-CM

## 2016-03-22 DIAGNOSIS — Z1211 Encounter for screening for malignant neoplasm of colon: Secondary | ICD-10-CM | POA: Diagnosis not present

## 2016-03-22 DIAGNOSIS — Z86010 Personal history of colonic polyps: Secondary | ICD-10-CM

## 2016-03-22 DIAGNOSIS — Z124 Encounter for screening for malignant neoplasm of cervix: Secondary | ICD-10-CM

## 2016-03-22 LAB — POC HEMOCCULT BLD/STL (OFFICE/1-CARD/DIAGNOSTIC): Fecal Occult Blood, POC: NEGATIVE

## 2016-03-22 NOTE — Patient Instructions (Signed)
F/u in 2.5 month, call if you need me before  VITAL that you take BP meds EVERY day on schedule, same time  Use allergy nasal sprays daily as we discussed to control allergy symptoms  Schedule appt with Dr Laural Golden your colonoscopy is overdue, you had polyps, IMPORTANT  Please work on good  health habits so that your health will improve. 1. Commitment to daily physical activity for 30 to 60  minutes, if you are able to do this.  2. Commitment to wise food choices. Aim for half of your  food intake to be vegetable and fruit, one quarter starchy foods, and one quarter protein. Try to eat on a regular schedule  3 meals per day, snacking between meals should be limited to vegetables or fruits or small portions of nuts. 64 ounces of water per day is generally recommended, unless you have specific health conditions, like heart failure or kidney failure where you will need to limit fluid intake.  3. Commitment to sufficient and a  good quality of physical and mental rest daily, generally between 6 to 8 hours per day.  WITH PERSISTANCE AND PERSEVERANCE, THE IMPOSSIBLE , BECOMES THE NORM!  Fasting labs in 2.5 month, before f/u 1 week  Thank you  for choosing [facility]. We consider it a privelige to serve you.  Delivering excellent health care in a caring and  compassionate way is our goal.  Partnering with you,  so that together we can achieve this goal is our strategy.

## 2016-03-22 NOTE — Progress Notes (Signed)
   Subjective:    Patient ID: Tracy Joseph, female    DOB: [birth year], 45 y.o.   MRN: [id number]  HPI  Patient is in for annual physical exam. No other health concerns are expressed or addressed at the visit. Recent labs, if available are reviewed. Immunization is reviewed , and  updated if needed. Needs mammogram and colonoscopy   Review of Systems See HPI     Objective:   Physical Exam BP 160/100 mmHg  Pulse 84  Resp 16  Ht 5\' 6"  (1.676 m)  Wt 237 lb (107.502 kg)  BMI 38.27 kg/m2  SpO2 97%  Pleasant  female, alert and oriented x 3, in no cardio-pulmonary distress. Afebrile. HEENT No facial trauma or asymetry. Sinuses non tender.  Extra occullar muscles intact, pupils equally reactive to light. External ears normal, tympanic membranes partially occluded. Oropharynx moist, no exudate, good dentition. Neck: supple, no adenopathy,JVD or thyromegaly.No bruits.  Chest: Clear to ascultation bilaterally.No crackles or wheezes. Non tender to palpation  Breast: No asymetry,no masses or lumps. No tenderness. No nipple discharge or inversion. No axillary or supraclavicular adenopathy  Cardiovascular system; Heart sounds normal,  S1 and  S2 ,no S3.  No murmur, or thrill. Apical beat not displaced Peripheral pulses normal.  Abdomen: Soft, non tender, no organomegaly or masses. No bruits. Bowel sounds normal. No guarding, tenderness or rebound.  Rectal:  Normal sphincter tone. No mass.No rectal masses.  Guaiac negative stool.  GU: External genitalia normal female genitalia , female distribution of hair. No lesions. Urethral meatus normal in size, no  Prolapse, no lesions visibly  Present. Bladder non tender. Vagina pink and moist , with no visible lesions , discharge present . Adequate pelvic support no  cystocele or rectocele noted Cervix pink and appears healthy, no lesions or ulcerations noted, no discharge noted from os Uterus normal size, no adnexal  masses, no cervical motion or adnexal tenderness.   Musculoskeletal exam: Full ROM of spine, hips , shoulders and knees. No deformity ,swelling or crepitus noted. No muscle wasting or atrophy.   Neurologic: Cranial nerves 2 to 12 intact. Power, tone ,sensation and reflexes normal throughout. No disturbance in gait. No tremor.  Skin: Intact, no ulceration, erythema , scaling or rash noted. Pigmentation normal throughout  Psych; Normal mood and affect. Judgement and concentration normal        Assessment & Plan:  Annual physical exam Annual exam as documented. Counseling done  re healthy lifestyle involving commitment to 150 minutes exercise per week, heart healthy diet, and attaining healthy weight.The importance of adequate sleep also discussed. Regular seat belt use and home safety, is also discussed. Changes in health habits are decided on by the patient with goals and time frames  set for achieving them. Immunization and cancer screening needs are specifically addressed at this visit.   Essential hypertension, malignant Ongoing non compliance resulting oin uncontrolled bP. Re educated DASH diet and commitment to daily physical activity for a minimum of 30 minutes discussed and encouraged, as a part of hypertension management. The importance of attaining a healthy weight is also discussed.  BP/Weight 03/22/2016 02/23/2016 12/15/2015 08/10/2015 04/30/2015 04/23/2015 [id number]  Systolic BP 0000000 Q000111Q 0000000 123456 XX123456 0000000 Q000111Q  Diastolic BP 123XX123 A999333 A999333 98 86 90 88  Wt. (Lbs) 237 - 237 231 235 - 235.12  BMI 38.27 - 38.27 37.3 37.95 - 37.97

## 2016-03-23 LAB — CYTOLOGY - PAP

## 2016-03-25 NOTE — Assessment & Plan Note (Signed)

## 2016-03-25 NOTE — Assessment & Plan Note (Signed)
Ongoing non compliance resulting oin uncontrolled bP. Re educated DASH diet and commitment to daily physical activity for a minimum of 30 minutes discussed and encouraged, as a part of hypertension management. The importance of attaining a healthy weight is also discussed.  BP/Weight 03/22/2016 02/23/2016 12/15/2015 08/10/2015 04/30/2015 04/23/2015 [id number]  Systolic BP 0000000 Q000111Q 0000000 123456 [id number] 0000000 Q000111Q  Diastolic BP 123XX123 A999333 A999333 98 86 90 88  Wt. (Lbs) 237 - 237 231 235 - 235.12  BMI 38.27 - 38.27 37.3 37.95 - 37.97

## 2016-03-29 ENCOUNTER — Encounter (INDEPENDENT_AMBULATORY_CARE_PROVIDER_SITE_OTHER): Payer: Self-pay | Admitting: *Deleted

## 2016-04-04 ENCOUNTER — Other Ambulatory Visit: Payer: Self-pay

## 2016-04-04 DIAGNOSIS — I10 Essential (primary) hypertension: Secondary | ICD-10-CM

## 2016-04-04 MED ORDER — AMLODIPINE BESYLATE 10 MG PO TABS: ORAL_TABLET | ORAL | Status: DC

## 2016-04-04 MED ORDER — TRIAMTERENE-HCTZ 37.5-25 MG PO TABS: 1.0000 | ORAL_TABLET | Freq: Every day | ORAL | Status: DC

## 2016-04-24 ENCOUNTER — Encounter: Payer: Self-pay | Admitting: Endocrinology

## 2016-04-24 ENCOUNTER — Ambulatory Visit (INDEPENDENT_AMBULATORY_CARE_PROVIDER_SITE_OTHER): Payer: BLUE CROSS/BLUE SHIELD | Admitting: Endocrinology

## 2016-04-24 VITALS — BP 142/100 | HR 84 | Ht 66.0 in | Wt 236.8 lb

## 2016-04-24 DIAGNOSIS — E890 Postprocedural hypothyroidism: Secondary | ICD-10-CM

## 2016-04-24 LAB — TSH: TSH: 7.66 u[IU]/mL — ABNORMAL HIGH (ref 0.35–4.50)

## 2016-04-24 MED ORDER — LEVOTHYROXINE SODIUM 137 MCG PO TABS: 137.0000 ug | ORAL_TABLET | Freq: Every day | ORAL | Status: DC

## 2016-04-24 NOTE — Patient Instructions (Addendum)
blood tests are requested for you today.  We'll let you know about the results.  Please see Dr Simpson soon, to recheck your blood pressure.  Please return in 1 year.  

## 2016-04-24 NOTE — Progress Notes (Signed)
   Subjective:    Patient ID: Tracy Joseph, female    DOB: 01-06-1971, 45 y.o.   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  Since synthroid was increased, she feels better in general.  She says she never misses the synthroid.   Past Medical History  Diagnosis Date  . Obesity   . Essential hypertension   . Goiter     Initially hyperthyroid treated with RAI, now hypothyroid - follows with Dr. [name]  . Prediabetes   . Tubular adenoma of colon   . Seasonal allergies   . Obstructive sleep apnea     Suspected     Past Surgical History  Procedure Laterality Date  . Tubal ligation  2005    Social History   Social History  . Marital Status: Married    Spouse Name: N/A  . Number of Children: 1  . Years of Education: N/A   Occupational History  . full time student for [employer]    Social History Main Topics  . Smoking status: Never Smoker   . Smokeless tobacco: Not on file  . Alcohol Use: No  . Drug Use: No  . Sexual Activity: Yes   Other Topics Concern  . Not on file   Social History Narrative    Current Outpatient Prescriptions on File Prior to Visit  Medication Sig Dispense Refill  . amLODipine (NORVASC) 10 MG tablet TAKE 1 TABLET (10 MG TOTAL) BY MOUTH DAILY. 90 tablet 1  . hydrALAZINE (APRESOLINE) 100 MG tablet Take 1 tablet (100 mg total) by mouth 2 (two) times daily. 60 tablet 3  . Multiple Vitamin (MULTIVITAMIN) tablet Take 1 tablet by mouth daily.    Marland Kitchen triamterene-hydrochlorothiazide (MAXZIDE-25) 37.5-25 MG tablet Take 1 tablet by mouth daily. 90 tablet 1   No current facility-administered medications on file prior to visit.    Allergies  Allergen Reactions  . Ace Inhibitors Cough  . Dust Mite Extract   . Pollen Extract     Family History  Problem Relation Age of Onset  . Hypertension Mother   . Hyperlipidemia  Mother   . Thyroid disease Paternal Grandmother   . Coronary artery disease Maternal Aunt   . Coronary artery disease Maternal Aunt     BP 142/100 mmHg  Pulse 84  Ht 5\' 6"  (1.676 m)  Wt 236 lb 12.8 oz (107.412 kg)  BMI 38.24 kg/m2  SpO2 96%  LMP 04/24/2016  Review of Systems No weight change    Objective:   Physical Exam VITAL SIGNS:  See vs page GENERAL: no distress NECK: There is no palpable thyroid enlargement.  No thyroid nodule is palpable.  No palpable lymphadenopathy at the anterior neck.  Lab Results  Component Value Date   TSH 7.66* 04/24/2016   T4TOTAL 13.4* 01/05/2011      Assessment & Plan:  hypothyroidism: she needs increased rx.   Patient is advised the following: Patient Instructions  blood tests are requested for you today.  We'll let you know about the results.  Please see Dr Moshe Cipro soon, to recheck your blood pressure. Please return in 1 year.   addendum: i have sent a prescription to your pharmacy, to increase synthroid.

## 2016-05-21 IMAGING — US US SOFT TISSUE HEAD/NECK
1 series · 13 of 25 positions shown · non-contrast
Comparison: None.

CLINICAL DATA: Goiter. History of hypothyroidism. Evaluate thyroid
nodules.

EXAM:
THYROID ULTRASOUND
TECHNIQUE: Ultrasound examination of the thyroid gland and adjacent soft
tissues was performed.

[Series 1: us soft tissue head/neck · 0.06mm/px · 13 of 64 slices shown]
[im 1/64]
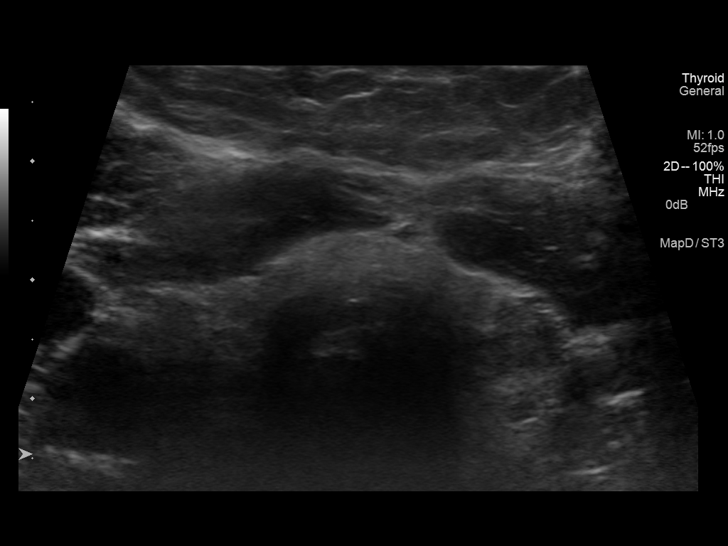
[im 6/64]
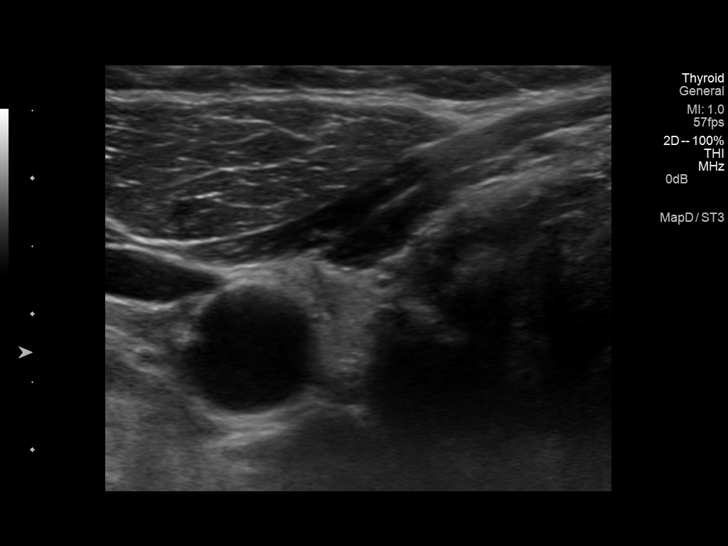
[im 11/64]
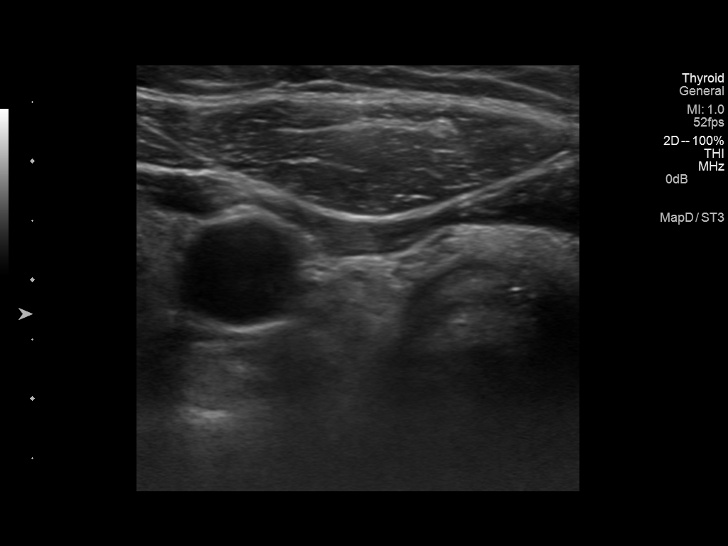
[im 16/64]
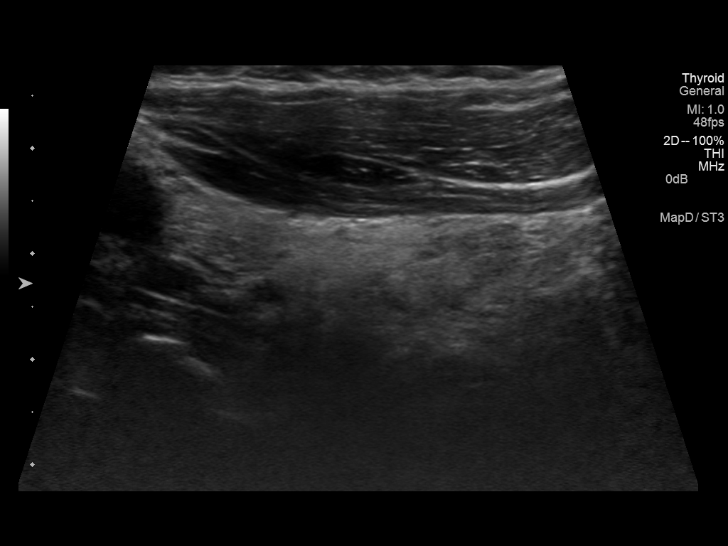
[im 22/64]
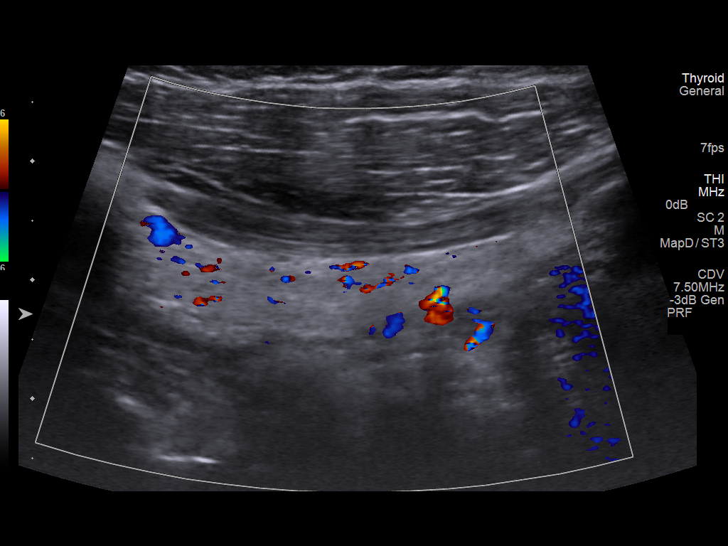
[im 27/64]
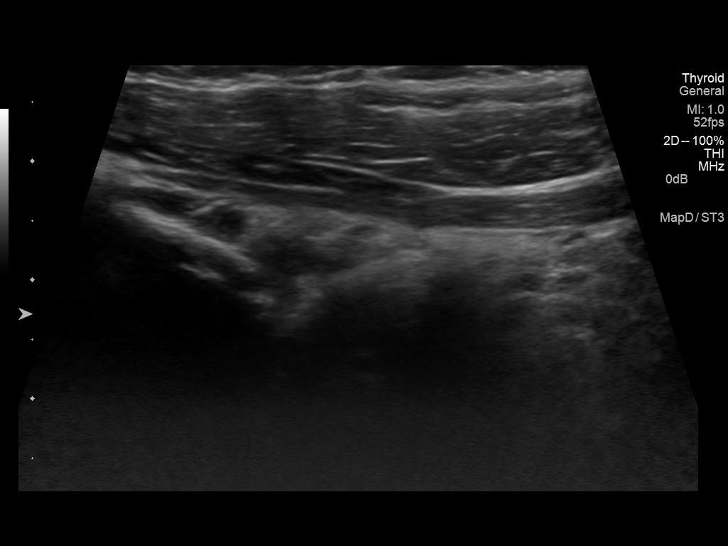
[im 32/64]
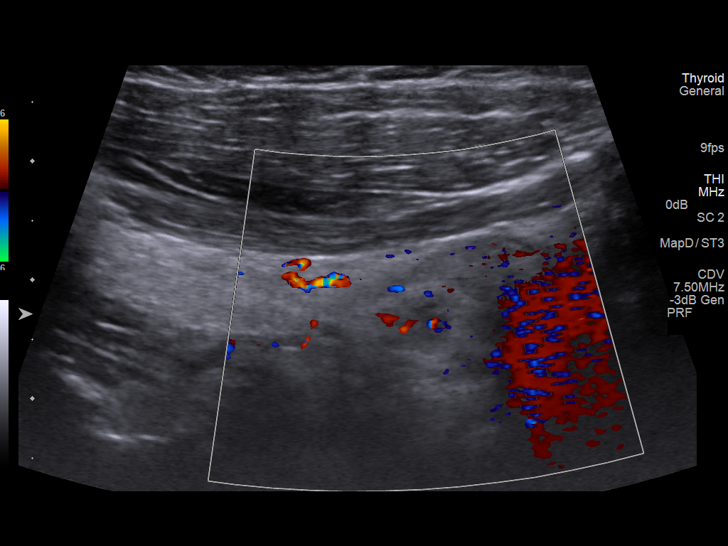
[im 37/64]
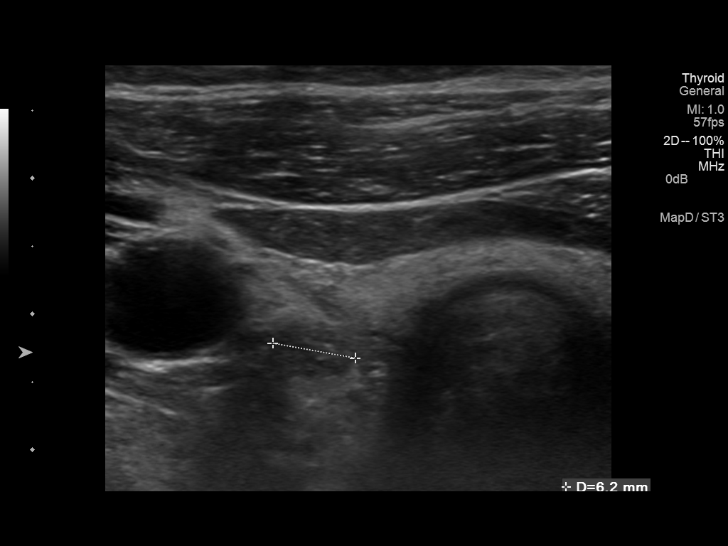
[im 43/64]
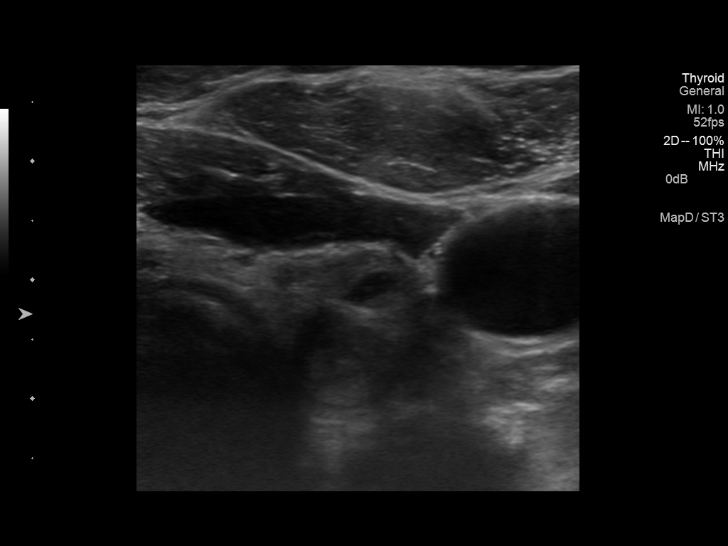
[im 48/64]
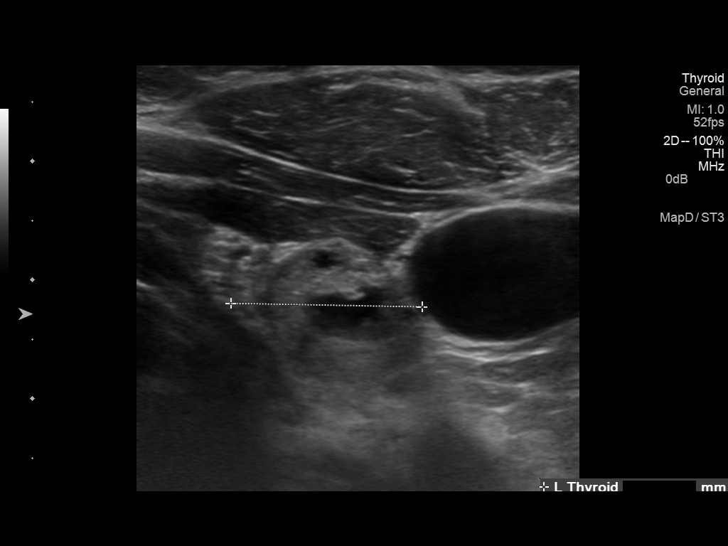
[im 53/64]
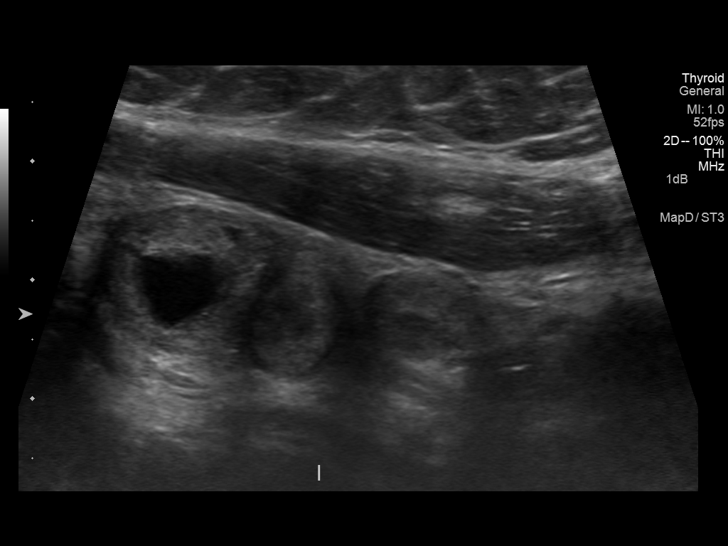
[im 58/64]
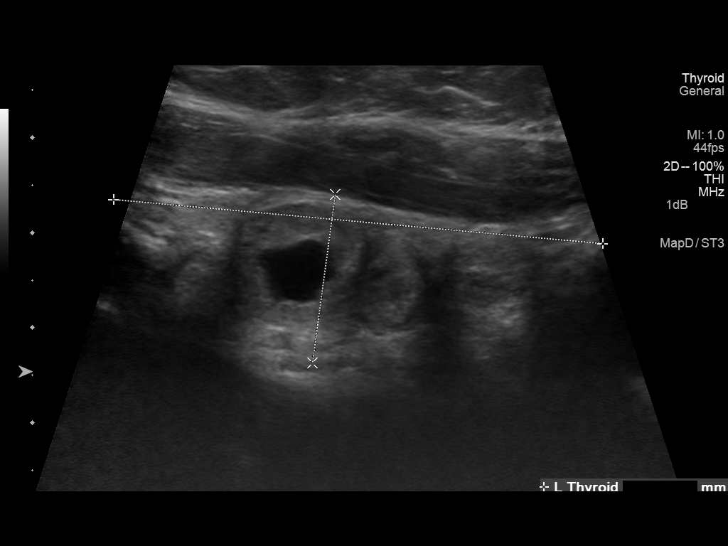
[im 64/64]
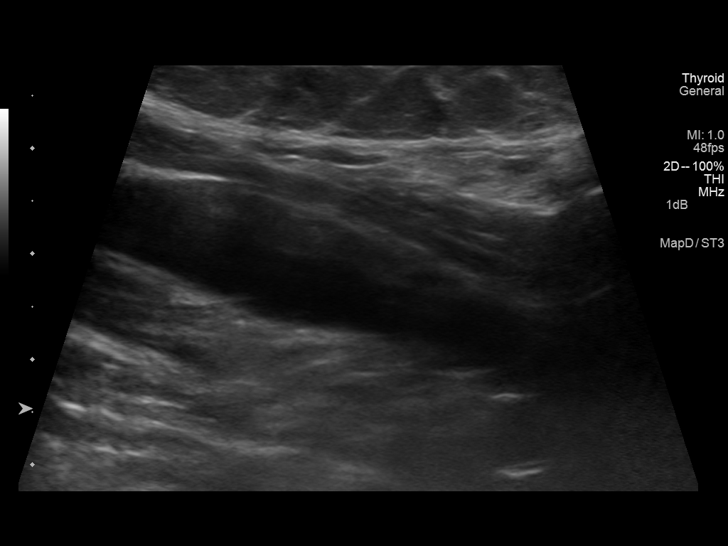

[13 of 25 positions shown; findings below may reference images not displayed]

FINDINGS: There is unchanged mild diffuse heterogeneity of thyroid parenchymal
echotexture.

Right thyroid lobe

Measurements: Remains diminutive in size measuring 5.0 x 1.5 x
cm, previously, 5.3 x 2.0 x 2.5 cm.

Right, inferior - 0.5 x 0.5 x 0.6 cm - mixed echogenic, ill-defined,
likely a pseudo nodule

Left thyroid lobe

Measurements: Remains diminutive in size measuring 5.2 x 1.8 x
cm, previously, 5.5 x 2.0 x 2.1 cm.

Left, superior - 1.3 x 1.4 x 1.1 cm - mixed echogenic, centrally
cystic, peripherally solid - grossly unchanged, previously, 1.6 x
1.2 x 1.5 cm.

Left, mid - 1.0 x 0.7 x 0.8 cm - mixed echogenic, solid - grossly
unchanged, previously, 1.2 x 0.9 x 1.2 cm.

Left, inferior - 1.0 x 0.8 x 0.9 cm - mixed echogenic, solid - not
discretely measured on the prior examination.

Isthmus

Thickness: Normal in size measuring 0.3 cm in diameter.

No discrete nodule or mass is identified within the thyroid isthmus.

Lymphadenopathy

None visualized.
IMPRESSION: 1. Similar atrophic appearing thyroid grand compatible with provided
history of hypothyroidism.
2. None of the discretely measured thyroid nodules currently meet
imaging criteria to recommend percutaneous sampling with the
dominant nodules within the left lobe of the thyroid appearing
unchanged since the 8939 examination.
This recommendation follows the consensus statement: Management of
Thyroid Nodules Detected at US: Society of Radiologists in

## 2016-06-01 ENCOUNTER — Ambulatory Visit: Payer: BLUE CROSS/BLUE SHIELD | Admitting: Family Medicine

## 2016-06-14 ENCOUNTER — Encounter: Payer: Self-pay | Admitting: Family Medicine

## 2016-06-14 ENCOUNTER — Ambulatory Visit (INDEPENDENT_AMBULATORY_CARE_PROVIDER_SITE_OTHER): Payer: BLUE CROSS/BLUE SHIELD | Admitting: Family Medicine

## 2016-06-14 VITALS — BP 144/92 | HR 87 | Resp 16 | Ht 66.0 in | Wt 234.0 lb

## 2016-06-14 DIAGNOSIS — D509 Iron deficiency anemia, unspecified: Secondary | ICD-10-CM | POA: Diagnosis not present

## 2016-06-14 DIAGNOSIS — E559 Vitamin D deficiency, unspecified: Secondary | ICD-10-CM

## 2016-06-14 DIAGNOSIS — I10 Essential (primary) hypertension: Secondary | ICD-10-CM | POA: Diagnosis not present

## 2016-06-14 DIAGNOSIS — E890 Postprocedural hypothyroidism: Secondary | ICD-10-CM | POA: Diagnosis not present

## 2016-06-14 DIAGNOSIS — IMO0001 Reserved for inherently not codable concepts without codable children: Secondary | ICD-10-CM

## 2016-06-14 DIAGNOSIS — R7301 Impaired fasting glucose: Secondary | ICD-10-CM

## 2016-06-14 DIAGNOSIS — Z13220 Encounter for screening for lipoid disorders: Secondary | ICD-10-CM

## 2016-06-14 LAB — CBC
HEMATOCRIT: 39.8 % (ref 35.0–45.0)
HEMOGLOBIN: 13.6 g/dL (ref 11.7–15.5)
MCH: 30.9 pg (ref 27.0–33.0)
MCHC: 34.2 g/dL (ref 32.0–36.0)
MCV: 90.5 fL (ref 80.0–100.0)
MPV: 9.8 fL (ref 7.5–12.5)
Platelets: 255 10*3/uL (ref 140–400)
RBC: 4.4 MIL/uL (ref 3.80–5.10)
RDW: 16.1 % — AB (ref 11.0–15.0)
WBC: 7.5 10*3/uL (ref 3.8–10.8)

## 2016-06-14 LAB — FERRITIN: FERRITIN: 23 ng/mL (ref 10–232)

## 2016-06-14 MED ORDER — TRIAMTERENE-HCTZ 37.5-25 MG PO TABS: ORAL_TABLET | ORAL | Status: DC

## 2016-06-14 NOTE — Assessment & Plan Note (Signed)
Improved Patient re-educated about  the importance of commitment to a  minimum of 150 minutes of exercise per week.  The importance of healthy food choices with portion control discussed. Encouraged to start a food diary, count calories and to consider  joining a support group. Sample diet sheets offered. Goals set by the patient for the next several months.   Weight /BMI 06/14/2016 04/24/2016 03/22/2016  WEIGHT 234 lb 236 lb 12.8 oz 237 lb  HEIGHT 5\' 6"  5\' 6"  5\' 6"   BMI 37.79 kg/m2 38.24 kg/m2 38.27 kg/m2

## 2016-06-14 NOTE — Assessment & Plan Note (Signed)
Patient educated about the importance of limiting  Carbohydrate intake , the need to commit to daily physical activity for a minimum of 30 minutes , and to commit weight loss. The fact that changes in all these areas will reduce or eliminate all together the development of diabetes is stressed.  Updated lab needed at/ before next visit.   Diabetic Labs Latest Ref Rng 12/15/2015 08/10/2015 04/06/2015 01/04/2011 02/01/2009  HbA1c <5.7 % 6.3(H) 6.3(H) 6.0(H) 6.2(H) -  Chol 0 - 200 mg/dL - - 194 150 176  HDL >=46 mg/dL - - 57 41 52  Calc LDL 0 - 99 mg/dL - - 112(H) 89 106(H)  Triglycerides <150 mg/dL - - 124 102 91  Creatinine 0.50 - 1.10 mg/dL 0.88 0.97 0.99 0.87 1.05   BP/Weight 06/14/2016 04/24/2016 03/22/2016 02/23/2016 12/15/2015 08/10/2015 [id number]  Systolic BP 123456 A999333 0000000 Q000111Q 0000000 123456 XX123456  Diastolic BP 92 123XX123 123XX123 A999333 102 98 86  Wt. (Lbs) 234 236.8 237 - 237 231 235  BMI 37.79 38.24 38.27 - 38.27 37.3 37.95   No flowsheet data found.

## 2016-06-14 NOTE — Patient Instructions (Signed)
F/u in 4.month, call if you need me before  INCREASE triamterene to ONE and a HALF tablets once daily start today please  HBA1C, cbc , iron and ferritin, lipid and cmp today    Please work on good  health habits so that your health will improve. 1. Commitment to daily physical activity for 30 to 60  minutes, if you are able to do this.  2. Commitment to wise food choices. Aim for half of your  food intake to be vegetable and fruit, one quarter starchy foods, and one quarter protein. Try to eat on a regular schedule  3 meals per day, snacking between meals should be limited to vegetables or fruits or small portions of nuts. 64 ounces of water per day is generally recommended, unless you have specific health conditions, like heart failure or kidney failure where you will need to limit fluid intake.  3. Commitment to sufficient and a  good quality of physical and mental rest daily, generally between 6 to 8 hours per day.  WITH PERSISTANCE AND PERSEVERANCE, THE IMPOSSIBLE , BECOMES THE NORM!  Thank you  for choosing [facility]. We consider it a privelige to serve you.  Delivering excellent health care in a caring and  compassionate way is our goal.  Partnering with you,  so that together we can achieve this goal is our strategy. Call for colonoscopy

## 2016-06-14 NOTE — Progress Notes (Signed)
Subjective:    Patient ID: Tracy Joseph, female    DOB: Jul 09, 1971, 45 y.o.   MRN: [id number]  HPI   Tracy Joseph     MRN: [id number]      DOB: 05-22-1971   HPI Tracy Joseph is here for follow up and re-evaluation of chronic medical conditions, medication management and review of any available recent lab and radiology data.  Preventive health is updated, specifically  Cancer screening and Immunization.   Questions or concerns regarding consultations or procedures which the PT has had in the interim are  addressed. The PT denies any adverse reactions to current medications since the last visit.  There are no new concerns.  There are no specific complaints   ROS Denies recent fever or chills. Denies sinus pressure, nasal congestion, ear pain or sore throat. Denies chest congestion, productive cough or wheezing. Denies chest pains, palpitations and leg swelling Denies abdominal pain, nausea, vomiting,diarrhea or constipation.   Denies dysuria, frequency, hesitancy or incontinence. Denies joint pain, swelling and limitation in mobility. Denies headaches, seizures, numbness, or tingling. C/o stress and anxiety due to current ill health of spouse. Denies skin break down or rash.   PE  BP 144/92 mmHg  Pulse 87  Resp 16  Ht 5\' 6"  (1.676 m)  Wt 234 lb (106.142 kg)  BMI 37.79 kg/m2  SpO2 99%  Patient alert and oriented and in no cardiopulmonary distress.  HEENT: No facial asymmetry, EOMI,   oropharynx pink and moist.  Neck supple no JVD, no mass.  Chest: Clear to auscultation bilaterally.  CVS: S1, S2 no murmurs, no S3.Regular rate.  ABD: Soft non tender.   Ext: No edema  MS: Adequate ROM spine, shoulders, hips and knees.  Skin: Intact, no ulcerations or rash noted.  Psych: Good eye contact, normal affect. Memory intact  anxious not depressed appearing.  CNS: CN 2-12 intact, power,  normal throughout.no focal deficits noted.   Assessment & Plan   Essential  hypertension, malignant Uncontrolled , increase dose of maxzide DASH diet and commitment to daily physical activity for a minimum of 30 minutes discussed and encouraged, as a part of hypertension management. The importance of attaining a healthy weight is also discussed.  BP/Weight 06/14/2016 04/24/2016 03/22/2016 02/23/2016 12/15/2015 08/10/2015 [id number]  Systolic BP 123456 A999333 0000000 Q000111Q 0000000 123456 [id number]  Diastolic BP 92 123XX123 123XX123 A999333 102 98 86  Wt. (Lbs) 234 236.8 237 - 237 231 235  BMI 37.79 38.24 38.27 - 38.27 37.3 37.95        Obesity, Class II, BMI 35.0-39.9, with comorbidity (see actual BMI) Improved Patient re-educated about  the importance of commitment to a  minimum of 150 minutes of exercise per week.  The importance of healthy food choices with portion control discussed. Encouraged to start a food diary, count calories and to consider  joining a support group. Sample diet sheets offered. Goals set by the patient for the next several months.   Weight /BMI 06/14/2016 04/24/2016 03/22/2016  WEIGHT 234 lb 236 lb 12.8 oz 237 lb  HEIGHT 5\' 6"  5\' 6"  5\' 6"   BMI 37.79 kg/m2 38.24 kg/m2 38.27 kg/m2       Vitamin D deficiency Updated lab needed at/ before next visit.   Iron deficiency anemia Updated lab needed and will be done today.   IMPAIRED FASTING GLUCOSE Patient educated about the importance of limiting  Carbohydrate intake , the need to commit to daily physical activity for a minimum of 30  minutes , and to commit weight loss. The fact that changes in all these areas will reduce or eliminate all together the development of diabetes is stressed.  Updated lab needed at/ before next visit.   Diabetic Labs Latest Ref Rng 12/15/2015 08/10/2015 04/06/2015 01/04/2011 02/01/2009  HbA1c <5.7 % 6.3(H) 6.3(H) 6.0(H) 6.2(H) -  Chol 0 - 200 mg/dL - - 194 150 176  HDL >=46 mg/dL - - 57 41 52  Calc LDL 0 - 99 mg/dL - - 112(H) 89 106(H)  Triglycerides <150 mg/dL - - 124 102 91  Creatinine 0.50 - 1.10  mg/dL 0.88 0.97 0.99 0.87 1.05   BP/Weight 06/14/2016 04/24/2016 03/22/2016 02/23/2016 12/15/2015 08/10/2015 [id number]  Systolic BP 123456 A999333 0000000 Q000111Q 0000000 123456 [id number]  Diastolic BP 92 123XX123 123XX123 A999333 102 98 86  Wt. (Lbs) 234 236.8 237 - 237 231 235  BMI 37.79 38.24 38.27 - 38.27 37.3 37.95   No flowsheet data found.          Review of Systems     Objective:   Physical Exam        Assessment & Plan:

## 2016-06-14 NOTE — Assessment & Plan Note (Signed)
Updated lab needed and will be done today.

## 2016-06-14 NOTE — Assessment & Plan Note (Signed)
Updated lab needed at/ before next visit.   

## 2016-06-14 NOTE — Assessment & Plan Note (Signed)
Uncontrolled , increase dose of maxzide DASH diet and commitment to daily physical activity for a minimum of 30 minutes discussed and encouraged, as a part of hypertension management. The importance of attaining a healthy weight is also discussed.  BP/Weight 06/14/2016 04/24/2016 03/22/2016 02/23/2016 12/15/2015 08/10/2015 [id number]  Systolic BP 123456 A999333 0000000 Q000111Q 0000000 123456 [id number]  Diastolic BP 92 123XX123 123XX123 A999333 102 98 86  Wt. (Lbs) 234 236.8 237 - 237 231 235  BMI 37.79 38.24 38.27 - 38.27 37.3 37.95

## 2016-06-15 LAB — COMPREHENSIVE METABOLIC PANEL
ALK PHOS: 86 U/L (ref 33–115)
ALT: 7 U/L (ref 6–29)
AST: 16 U/L (ref 10–35)
Albumin: 3.9 g/dL (ref 3.6–5.1)
BUN: 10 mg/dL (ref 7–25)
CHLORIDE: 101 mmol/L (ref 98–110)
CO2: 22 mmol/L (ref 20–31)
Calcium: 9.3 mg/dL (ref 8.6–10.2)
Creat: 1.02 mg/dL (ref 0.50–1.10)
GLUCOSE: 137 mg/dL — AB (ref 65–99)
POTASSIUM: 3.9 mmol/L (ref 3.5–5.3)
Sodium: 136 mmol/L (ref 135–146)
Total Bilirubin: 0.5 mg/dL (ref 0.2–1.2)
Total Protein: 7.5 g/dL (ref 6.1–8.1)

## 2016-06-15 LAB — LIPID PANEL
Cholesterol: 190 mg/dL (ref 125–200)
HDL: 45 mg/dL — ABNORMAL LOW (ref >=46)
LDL CALC: 113 mg/dL (ref <130)
TRIGLYCERIDES: 160 mg/dL — AB (ref <150)
Total CHOL/HDL Ratio: 4.2 Ratio (ref <=5.0)
VLDL: 32 mg/dL — AB (ref <30)

## 2016-06-15 LAB — IRON: IRON: 66 ug/dL (ref 40–190)

## 2016-06-15 LAB — HEMOGLOBIN A1C
Hgb A1c MFr Bld: 5.9 % — ABNORMAL HIGH (ref <5.7)
Mean Plasma Glucose: 123 mg/dL

## 2016-10-17 ENCOUNTER — Ambulatory Visit: Payer: BLUE CROSS/BLUE SHIELD | Admitting: Family Medicine

## 2016-10-18 ENCOUNTER — Encounter: Payer: Self-pay | Admitting: Family Medicine

## 2016-12-18 ENCOUNTER — Other Ambulatory Visit: Payer: Self-pay | Admitting: Family Medicine

## 2016-12-18 DIAGNOSIS — I10 Essential (primary) hypertension: Secondary | ICD-10-CM

## 2017-01-08 ENCOUNTER — Other Ambulatory Visit: Payer: Self-pay | Admitting: Family Medicine

## 2017-01-08 DIAGNOSIS — I10 Essential (primary) hypertension: Secondary | ICD-10-CM

## 2017-01-24 ENCOUNTER — Other Ambulatory Visit: Payer: Self-pay | Admitting: Family Medicine

## 2017-01-24 ENCOUNTER — Encounter: Payer: Self-pay | Admitting: Family Medicine

## 2017-01-24 ENCOUNTER — Ambulatory Visit (INDEPENDENT_AMBULATORY_CARE_PROVIDER_SITE_OTHER): Payer: BLUE CROSS/BLUE SHIELD | Admitting: Family Medicine

## 2017-01-24 VITALS — BP 152/96 | HR 96 | Resp 18 | Ht 66.0 in | Wt 236.0 lb

## 2017-01-24 DIAGNOSIS — E890 Postprocedural hypothyroidism: Secondary | ICD-10-CM

## 2017-01-24 DIAGNOSIS — D508 Other iron deficiency anemias: Secondary | ICD-10-CM | POA: Diagnosis not present

## 2017-01-24 DIAGNOSIS — Z1231 Encounter for screening mammogram for malignant neoplasm of breast: Secondary | ICD-10-CM

## 2017-01-24 DIAGNOSIS — E669 Obesity, unspecified: Secondary | ICD-10-CM

## 2017-01-24 DIAGNOSIS — N920 Excessive and frequent menstruation with regular cycle: Secondary | ICD-10-CM

## 2017-01-24 DIAGNOSIS — R7301 Impaired fasting glucose: Secondary | ICD-10-CM

## 2017-01-24 DIAGNOSIS — E559 Vitamin D deficiency, unspecified: Secondary | ICD-10-CM

## 2017-01-24 DIAGNOSIS — I10 Essential (primary) hypertension: Secondary | ICD-10-CM

## 2017-01-24 DIAGNOSIS — D126 Benign neoplasm of colon, unspecified: Secondary | ICD-10-CM

## 2017-01-24 DIAGNOSIS — IMO0001 Reserved for inherently not codable concepts without codable children: Secondary | ICD-10-CM

## 2017-01-24 LAB — COMPLETE METABOLIC PANEL WITH GFR
ALT: 9 U/L (ref 6–29)
AST: 19 U/L (ref 10–35)
Albumin: 4.2 g/dL (ref 3.6–5.1)
Alkaline Phosphatase: 94 U/L (ref 33–115)
BILIRUBIN TOTAL: 0.4 mg/dL (ref 0.2–1.2)
BUN: 12 mg/dL (ref 7–25)
CALCIUM: 9.7 mg/dL (ref 8.6–10.2)
CHLORIDE: 100 mmol/L (ref 98–110)
CO2: 25 mmol/L (ref 20–31)
CREATININE: 1.11 mg/dL — AB (ref 0.50–1.10)
GFR, EST AFRICAN AMERICAN: 68 mL/min (ref >=60)
GFR, Est Non African American: 59 mL/min — ABNORMAL LOW (ref >=60)
Glucose, Bld: 108 mg/dL — ABNORMAL HIGH (ref 65–99)
Potassium: 3.8 mmol/L (ref 3.5–5.3)
Sodium: 137 mmol/L (ref 135–146)
Total Protein: 8.1 g/dL (ref 6.1–8.1)

## 2017-01-24 LAB — CBC
HCT: 42.8 % (ref 35.0–45.0)
Hemoglobin: 14.3 g/dL (ref 11.7–15.5)
MCH: 31.5 pg (ref 27.0–33.0)
MCHC: 33.4 g/dL (ref 32.0–36.0)
MCV: 94.3 fL (ref 80.0–100.0)
MPV: 9.8 fL (ref 7.5–12.5)
PLATELETS: 304 10*3/uL (ref 140–400)
RBC: 4.54 MIL/uL (ref 3.80–5.10)
RDW: 14.4 % (ref 11.0–15.0)
WBC: 8.1 10*3/uL (ref 3.8–10.8)

## 2017-01-24 LAB — LIPID PANEL
CHOL/HDL RATIO: 3.8 ratio (ref <5.0)
Cholesterol: 199 mg/dL (ref <200)
HDL: 53 mg/dL (ref >50)
LDL Cholesterol: 123 mg/dL — ABNORMAL HIGH (ref <100)
Triglycerides: 115 mg/dL (ref <150)
VLDL: 23 mg/dL (ref <30)

## 2017-01-24 LAB — TSH: TSH: 10.37 m[IU]/L — AB

## 2017-01-24 LAB — FERRITIN: FERRITIN: 23 ng/mL (ref 10–232)

## 2017-01-24 LAB — IRON: Iron: 55 ug/dL (ref 40–190)

## 2017-01-24 NOTE — Assessment & Plan Note (Signed)
Uncontrolled, no taking maxzide as prescribed DASH diet and commitment to daily physical activity for a minimum of 30 minutes discussed and encouraged, as a part of hypertension management. The importance of attaining a healthy weight is also discussed.  BP/Weight 01/24/2017 06/14/2016 04/24/2016 03/22/2016 02/23/2016 12/15/2015 [id number]  Systolic BP 0000000 123456 A999333 0000000 Q000111Q 0000000 123456  Diastolic BP 96 92 123XX123 123XX123 102 102 98  Wt. (Lbs) 236 234 236.8 237 - 237 231  BMI 38.09 37.79 38.24 38.27 - 38.27 37.3      bP check in 5 weeks

## 2017-01-24 NOTE — Assessment & Plan Note (Signed)
Deteriorated. Patient re-educated about  the importance of commitment to a  minimum of 150 minutes of exercise per week.  The importance of healthy food choices with portion control discussed. Encouraged to start a food diary, count calories and to consider  joining a support group. Sample diet sheets offered. Goals set by the patient for the next several months.   Weight /BMI 01/24/2017 06/14/2016 04/24/2016  WEIGHT 236 lb 234 lb 236 lb 12.8 oz  HEIGHT 5\' 6"  5\' 6"  5\' 6"   BMI 38.09 kg/m2 37.79 kg/m2 38.24 kg/m2

## 2017-01-24 NOTE — Assessment & Plan Note (Signed)
Updated lab needed at/ before next visit.   
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
## 2017-01-24 NOTE — Assessment & Plan Note (Signed)
Colonoscopy repeat past due , needs to schedule

## 2017-01-24 NOTE — Patient Instructions (Signed)
Annual exam in in 3 month, call if you need me before  Take maxzide one and a half daily, BP is high, and do lifestyle change as we discussed  Nurse BP check last week in March  Take daily claritin or zyrtec , both OTC for allergies  Fasting lipid, cmp and EGFR, hBA1C , tSH, cBC , iron and ferritin and vit D today  Schedule and get mammogram and colonoscopy, letters provided with numbers to call  Thank you  for choosing [facility]. We consider it a privelige to serve you.  Delivering excellent health care in a caring and  compassionate way is our goal.  Partnering with you,  so that together we can achieve this goal is our strategy.

## 2017-01-24 NOTE — Assessment & Plan Note (Signed)
Updated lab needed obtaining today
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
## 2017-01-25 ENCOUNTER — Other Ambulatory Visit (INDEPENDENT_AMBULATORY_CARE_PROVIDER_SITE_OTHER): Payer: Self-pay | Admitting: *Deleted

## 2017-01-25 DIAGNOSIS — Z86010 Personal history of colonic polyps: Secondary | ICD-10-CM | POA: Insufficient documentation

## 2017-01-25 LAB — HEMOGLOBIN A1C
HEMOGLOBIN A1C: 6 % — AB (ref <5.7)
Mean Plasma Glucose: 126 mg/dL

## 2017-01-25 LAB — VITAMIN D 25 HYDROXY (VIT D DEFICIENCY, FRACTURES): Vit D, 25-Hydroxy: 14 ng/mL — ABNORMAL LOW (ref 30–100)

## 2017-01-26 ENCOUNTER — Other Ambulatory Visit: Payer: Self-pay | Admitting: Family Medicine

## 2017-01-26 ENCOUNTER — Telehealth: Payer: Self-pay | Admitting: Family Medicine

## 2017-01-26 NOTE — Progress Notes (Signed)
Vit d 

## 2017-01-26 NOTE — Telephone Encounter (Signed)
Tracy Joseph, please see result note , need higher dose synthroid, also pls advise and order TSH to be drawn week of April 23, I entered med changes historically. ALSO, I had to correct refill sent on maxzide ( not entered by you) to the CORRECT dose of one and half daily, speak with pharmacist and see if they will dispense the additional 45 tabs for the 3 month supply if she has already collected pls , also again pls ask directly with pt explain that if bottle she recently collected says one daily , the correct dosage , as we clearly discussed this past visit is one and a half tabs. Thanks!  ?? pls ask

## 2017-01-29 ENCOUNTER — Ambulatory Visit (HOSPITAL_COMMUNITY)
Admission: RE | Admit: 2017-01-29 | Discharge: 2017-01-29 | Disposition: A | Discharge status: Home / Self Care | Payer: BLUE CROSS/BLUE SHIELD | Source: Ambulatory Visit | Attending: Family Medicine | Admitting: Family Medicine

## 2017-01-29 DIAGNOSIS — Z1231 Encounter for screening mammogram for malignant neoplasm of breast: Secondary | ICD-10-CM | POA: Insufficient documentation

## 2017-02-02 ENCOUNTER — Other Ambulatory Visit: Payer: Self-pay

## 2017-02-02 MED ORDER — LEVOTHYROXINE SODIUM 150 MCG PO TABS: 150.0000 ug | ORAL_TABLET | Freq: Every day | ORAL | 3 refills | Status: DC

## 2017-02-02 NOTE — Telephone Encounter (Signed)
Called patient and left message for them to return call at the office   

## 2017-02-07 ENCOUNTER — Other Ambulatory Visit: Payer: Self-pay

## 2017-02-07 MED ORDER — ERGOCALCIFEROL 1.25 MG (50000 UT) PO CAPS: 50000.0000 [IU] | ORAL_CAPSULE | ORAL | 1 refills | Status: DC

## 2017-02-09 NOTE — Telephone Encounter (Signed)
Pt aware.

## 2017-03-23 ENCOUNTER — Encounter (INDEPENDENT_AMBULATORY_CARE_PROVIDER_SITE_OTHER): Payer: Self-pay | Admitting: *Deleted

## 2017-03-23 ENCOUNTER — Telehealth (INDEPENDENT_AMBULATORY_CARE_PROVIDER_SITE_OTHER): Payer: Self-pay | Admitting: *Deleted

## 2017-03-23 MED ORDER — PEG 3350-KCL-NA BICARB-NACL 420 G PO SOLR: 4000.0000 mL | Freq: Once | ORAL | 0 refills | Status: AC

## 2017-03-23 NOTE — Telephone Encounter (Signed)
Patient needs trilyte 

## 2017-04-03 ENCOUNTER — Telehealth (INDEPENDENT_AMBULATORY_CARE_PROVIDER_SITE_OTHER): Payer: Self-pay | Admitting: *Deleted

## 2017-04-03 NOTE — Telephone Encounter (Signed)
Referring MD/PCP: simpson   Procedure: tcs  Reason/Indication:  Hx polyps  Has patient had this procedure before?  Can't remember when  If so, when, by whom and where?    Is there a family history of colon cancer?  no  Who?  What age when diagnosed?    Is patient diabetic?   no      Does patient have prosthetic heart valve or mechanical valve?  no  Do you have a pacemaker?  no  Has patient ever had endocarditis? no  Has patient had joint replacement within last 12 months?  no  Does patient tend to be constipated or take laxatives? no  Does patient have a history of alcohol/drug use?  no  Is patient on Coumadin, Plavix and/or Aspirin? no  Medications: see epic  Allergies: see epic  Medication Adjustment per Dr Laural Golden:   Procedure date & time: 05/03/17 at 930

## 2017-04-04 NOTE — Telephone Encounter (Signed)
agree

## 2017-04-24 ENCOUNTER — Ambulatory Visit: Payer: BLUE CROSS/BLUE SHIELD | Admitting: Endocrinology

## 2017-04-25 ENCOUNTER — Telehealth: Payer: Self-pay | Admitting: Endocrinology

## 2017-04-25 ENCOUNTER — Encounter: Payer: Self-pay | Admitting: Family Medicine

## 2017-04-25 ENCOUNTER — Encounter: Payer: BLUE CROSS/BLUE SHIELD | Admitting: Family Medicine

## 2017-04-25 NOTE — Telephone Encounter (Signed)
Patient no showed today's appt. Please advise on how to follow up. °A. No follow up necessary. °B. Follow up urgent. Contact patient immediately. °C. Follow up necessary. Contact patient and schedule visit in ___ days. °D. Follow up advised. Contact patient and schedule visit in ____weeks. ° °

## 2017-04-25 NOTE — Telephone Encounter (Signed)
Please come back for a follow-up appointment in 2-4 weeks

## 2017-05-03 ENCOUNTER — Encounter (HOSPITAL_COMMUNITY)
Admission: RE | Disposition: A | Discharge status: Home / Self Care | Payer: Self-pay | Source: Ambulatory Visit | Attending: Internal Medicine

## 2017-05-03 ENCOUNTER — Ambulatory Visit (HOSPITAL_COMMUNITY)
Admission: RE | Admit: 2017-05-03 | Discharge: 2017-05-03 | Disposition: A | Discharge status: Home / Self Care | Payer: BLUE CROSS/BLUE SHIELD | Source: Ambulatory Visit | Attending: Internal Medicine | Admitting: Internal Medicine

## 2017-05-03 ENCOUNTER — Encounter (HOSPITAL_COMMUNITY): Payer: Self-pay

## 2017-05-03 DIAGNOSIS — Z86010 Personal history of colonic polyps: Secondary | ICD-10-CM

## 2017-05-03 DIAGNOSIS — Z9119 Patient's noncompliance with other medical treatment and regimen: Secondary | ICD-10-CM

## 2017-05-03 DIAGNOSIS — Z09 Encounter for follow-up examination after completed treatment for conditions other than malignant neoplasm: Secondary | ICD-10-CM | POA: Diagnosis not present

## 2017-05-03 DIAGNOSIS — Z79899 Other long term (current) drug therapy: Secondary | ICD-10-CM | POA: Insufficient documentation

## 2017-05-03 DIAGNOSIS — K5730 Diverticulosis of large intestine without perforation or abscess without bleeding: Secondary | ICD-10-CM

## 2017-05-03 DIAGNOSIS — Z1211 Encounter for screening for malignant neoplasm of colon: Secondary | ICD-10-CM | POA: Insufficient documentation

## 2017-05-03 DIAGNOSIS — G4733 Obstructive sleep apnea (adult) (pediatric): Secondary | ICD-10-CM | POA: Insufficient documentation

## 2017-05-03 DIAGNOSIS — R7303 Prediabetes: Secondary | ICD-10-CM | POA: Diagnosis not present

## 2017-05-03 DIAGNOSIS — E039 Hypothyroidism, unspecified: Secondary | ICD-10-CM | POA: Insufficient documentation

## 2017-05-03 DIAGNOSIS — Z6835 Body mass index (BMI) 35.0-35.9, adult: Secondary | ICD-10-CM | POA: Insufficient documentation

## 2017-05-03 DIAGNOSIS — E669 Obesity, unspecified: Secondary | ICD-10-CM | POA: Diagnosis not present

## 2017-05-03 DIAGNOSIS — I10 Essential (primary) hypertension: Secondary | ICD-10-CM | POA: Insufficient documentation

## 2017-05-03 HISTORY — PX: COLONOSCOPY: SHX5424

## 2017-05-03 SURGERY — COLONOSCOPY
Anesthesia: Moderate Sedation

## 2017-05-03 MED ORDER — STERILE WATER FOR IRRIGATION IR SOLN
Status: DC | PRN
  Administered 2017-05-03: 2.5 mL

## 2017-05-03 MED ORDER — ONDANSETRON 4 MG PO TBDP
4.0000 mg | ORAL_TABLET | Freq: Once | ORAL | Status: AC
  Administered 2017-05-03: 4 mg via ORAL

## 2017-05-03 MED ORDER — SODIUM CHLORIDE 0.9 % IV SOLN
INTRAVENOUS | Status: DC
  Administered 2017-05-03: 09:00:00 via INTRAVENOUS

## 2017-05-03 MED ORDER — MIDAZOLAM HCL 5 MG/5ML IJ SOLN
INTRAMUSCULAR | Status: DC | PRN
  Administered 2017-05-03: 2 mg via INTRAVENOUS
  Administered 2017-05-03: 1 mg via INTRAVENOUS
  Administered 2017-05-03 (×2): 2 mg via INTRAVENOUS

## 2017-05-03 MED ORDER — ONDANSETRON 4 MG PO TBDP
ORAL_TABLET | ORAL | Status: AC
  Filled 2017-05-03: qty 1

## 2017-05-03 MED ORDER — MEPERIDINE HCL 50 MG/ML IJ SOLN
INTRAMUSCULAR | Status: DC | PRN
  Administered 2017-05-03 (×2): 25 mg via INTRAVENOUS

## 2017-05-03 MED ORDER — MEPERIDINE HCL 50 MG/ML IJ SOLN
INTRAMUSCULAR | Status: AC
  Filled 2017-05-03: qty 1

## 2017-05-03 MED ORDER — MIDAZOLAM HCL 5 MG/5ML IJ SOLN
INTRAMUSCULAR | Status: DC
Start: 2017-05-03 — End: 2017-05-03
  Filled 2017-05-03: qty 10

## 2017-05-03 NOTE — Progress Notes (Signed)
Dr. Laural Golden in to see the patient.  Stated she was feeling better and would like to go home.  Patient dressing with family at side.  Sips of ginger ale.

## 2017-05-03 NOTE — Discharge Instructions (Signed)
Resume usual medications and high fiber diet. No driving for 24 hours. Repeat exam in one year.   Colonoscopy, Adult, Care After This sheet gives you information about how to care for yourself after your procedure. Your health care provider may also give you more specific instructions. If you have problems or questions, contact your health care provider. What can I expect after the procedure? After the procedure, it is common to have:  A small amount of blood in your stool for 24 hours after the procedure.  Some gas.  Mild abdominal cramping or bloating. Follow these instructions at home: General instructions    For the first 24 hours after the procedure:  Do not drive or use machinery.  Do not sign important documents.  Do not drink alcohol.  Do your regular daily activities at a slower pace than normal.  Eat soft, easy-to-digest foods.  Rest often.  Take over-the-counter or prescription medicines only as told by your health care provider.  It is up to you to get the results of your procedure. Ask your health care provider, or the department performing the procedure, when your results will be ready. Relieving cramping and bloating   Try walking around when you have cramps or feel bloated.  Apply heat to your abdomen as told by your health care provider. Use a heat source that your health care provider recommends, such as a moist heat pack or a heating pad.  Place a towel between your skin and the heat source.  Leave the heat on for 20-30 minutes.  Remove the heat if your skin turns bright red. This is especially important if you are unable to feel pain, heat, or cold. You may have a greater risk of getting burned. Eating and drinking   Drink enough fluid to keep your urine clear or pale yellow.  Resume your normal diet as instructed by your health care provider. Avoid heavy or fried foods that are hard to digest.  Avoid drinking alcohol for as long as instructed  by your health care provider. Contact a health care provider if:  You have blood in your stool 2-3 days after the procedure. Get help right away if:  You have more than a small spotting of blood in your stool.  You pass large blood clots in your stool.  Your abdomen is swollen.  You have nausea or vomiting.  You have a fever.  You have increasing abdominal pain that is not relieved with medicine. This information is not intended to replace advice given to you by your health care provider. Make sure you discuss any questions you have with your health care provider. Document Released: 07/18/2004 Document Revised: 08/28/2016 Document Reviewed: 02/15/2016 Elsevier Interactive Patient Education  2017 [employer].   High-Fiber Diet Fiber, also called dietary fiber, is a type of carbohydrate found in fruits, vegetables, whole grains, and beans. A high-fiber diet can have many health benefits. Your health care provider may recommend a high-fiber diet to help:  Prevent constipation. Fiber can make your bowel movements more regular.  Lower your cholesterol.  Relieve hemorrhoids, uncomplicated diverticulosis, or irritable bowel syndrome.  Prevent overeating as part of a weight-loss plan.  Prevent heart disease, type 2 diabetes, and certain cancers. What is my plan? The recommended daily intake of fiber includes:  38 grams for men under age 35.  36 grams for men over age 73.  31 grams for women under age 23.  5 grams for women over age 81. You can  get the recommended daily intake of dietary fiber by eating a variety of fruits, vegetables, grains, and beans. Your health care provider may also recommend a fiber supplement if it is not possible to get enough fiber through your diet. What do I need to know about a high-fiber diet?  Fiber supplements have not been widely studied for their effectiveness, so it is better to get fiber through food sources.  Always check the fiber  content on thenutrition facts label of any prepackaged food. Look for foods that contain at least 5 grams of fiber per serving.  Ask your dietitian if you have questions about specific foods that are related to your condition, especially if those foods are not listed in the following section.  Increase your daily fiber consumption gradually. Increasing your intake of dietary fiber too quickly may cause bloating, cramping, or gas.  Drink plenty of water. Water helps you to digest fiber. What foods can I eat? Grains  Whole-grain breads. Multigrain cereal. Oats and oatmeal. Brown rice. Barley. Bulgur wheat. [place]. Bran muffins. Popcorn. Rye wafer crackers. Vegetables  Sweet potatoes. Spinach. Kale. Artichokes. Cabbage. Broccoli. Green peas. Carrots. Squash. Fruits  Berries. Pears. Apples. Oranges. Avocados. Prunes and raisins. Dried figs. Meats and Other Protein Sources  [place], kidney, pinto, and soy beans. Split peas. Lentils. Nuts and seeds. Dairy  Fiber-fortified yogurt. Beverages  Fiber-fortified soy milk. Fiber-fortified orange juice. Other  Fiber bars. The items listed above may not be a complete list of recommended foods or beverages. Contact your dietitian for more options.  What foods are not recommended? Grains  White bread. Pasta made with refined flour. White rice. Vegetables  Fried potatoes. Canned vegetables. Well-cooked vegetables. Fruits  Fruit juice. Cooked, strained fruit. Meats and Other Protein Sources  Fatty cuts of meat. Fried [employer] executive or fried fish. Dairy  Milk. Yogurt. Cream cheese. Sour cream. Beverages  Soft drinks. Other  Cakes and pastries. Butter and oils. The items listed above may not be a complete list of foods and beverages to avoid. Contact your dietitian for more information.  What are some tips for including high-fiber foods in my diet?  Eat a wide variety of high-fiber foods.  Make sure that half of all grains consumed each day are whole  grains.  Replace breads and cereals made from refined flour or white flour with whole-grain breads and cereals.  Replace white rice with brown rice, bulgur wheat, or millet.  Start the day with a breakfast that is high in fiber, such as a cereal that contains at least 5 grams of fiber per serving.  Use beans in place of meat in soups, salads, or pasta.  Eat high-fiber snacks, such as berries, raw vegetables, nuts, or popcorn. This information is not intended to replace advice given to you by your health care provider. Make sure you discuss any questions you have with your health care provider. Document Released: 12/04/2005 Document Revised: 05/11/2016 Document Reviewed: 05/19/2014 Elsevier Interactive Patient Education  2017 [employer].

## 2017-05-03 NOTE — Op Note (Signed)
[facility] Patient Name: Tracy Joseph Procedure Date: 05/03/2017 8:55 AM MRN: [id number] Date of Birth: 05-04-1971 Attending MD: Hildred Laser , MD CSN: [id number] Age: 46 Admit Type: Outpatient Procedure:                Colonoscopy Indications:              High risk colon cancer surveillance: Personal                            history of colonic polyps Providers:                Hildred Laser, MD, Lurline Del, RN, Aram Candela Referring MD:             Norwood Levo. Moshe Cipro, MD Medicines:                Meperidine 50 mg IV, Midazolam 7 mg IV Complications:            No immediate complications. Estimated Blood Loss:     Estimated blood loss: none. Procedure:                Pre-Anesthesia Assessment:                           - Prior to the procedure, a History and Physical                            was performed, and patient medications and                            allergies were reviewed. The patient's tolerance of                            previous anesthesia was also reviewed. The risks                            and benefits of the procedure and the sedation                            options and risks were discussed with the patient.                            All questions were answered, and informed consent                            was obtained. Prior Anticoagulants: The patient                            last took naproxen 14 days prior to the procedure.                            ASA Grade Assessment: II - A patient with mild                            systemic disease. After reviewing the risks and  benefits, the patient was deemed in satisfactory                            condition to undergo the procedure.                           After obtaining informed consent, the colonoscope                            was passed under direct vision. Throughout the                            procedure, the patient's blood pressure, pulse, and                           oxygen saturations were monitored continuously. The                            EC-[id number] ([id number]) scope was introduced through                            the anus and advanced to the the cecum, identified                            by the ileocecal valve. The colonoscopy was                            performed without difficulty. The patient tolerated                            the procedure well. The quality of the bowel                            preparation was inadequate. The ileocecal valve and                            the rectum were photographed. Scope In: 9:29:49 AM Scope Out: 9:42:29 AM Scope Withdrawal Time: 0 hours 5 minutes 11 seconds  Total Procedure Duration: 0 hours 12 minutes 40 seconds  Findings:      The perianal and digital rectal examinations were normal.      Scattered medium-mouthed diverticula were found in the sigmoid colon.      The exam was otherwise normal throughout the examined colon.      The retroflexed view of the distal rectum and anal verge was normal and       showed no anal or rectal abnormalities. Impression:               - Preparation of the colon was inadequate.                           - Diverticulosis in the sigmoid colon.                           - No specimens collected. Moderate Sedation:      Moderate (conscious)  sedation was administered by the endoscopy nurse       and supervised by the endoscopist. The following parameters were       monitored: oxygen saturation, heart rate, blood pressure, CO2       capnography and response to care. Total physician intraservice time was       19 minutes. Recommendation:           - Patient has a contact number available for                            emergencies. The signs and symptoms of potential                            delayed complications were discussed with the                            patient. Return to normal activities tomorrow.                             Written discharge instructions were provided to the                            patient.                           - High fiber diet today.                           - Continue present medications.                           - Repeat colonoscopy in 1 year for surveillance. Procedure Code(s):        --- Professional ---                           [phone], Colonoscopy, flexible; diagnostic, including                            collection of specimen(s) by brushing or washing,                            when performed (separate procedure)                           99152, Moderate sedation services provided by the                            same physician or other qualified health care                            professional performing the diagnostic or                            therapeutic service that the sedation supports,  requiring the presence of an independent trained                            observer to assist in the monitoring of the                            patient's level of consciousness and physiological                            status; initial 15 minutes of intraservice time,                            patient age 67 years or older Diagnosis Code(s):        --- Professional ---                           Z86.010, Personal history of colonic polyps                           K57.30, Diverticulosis of large intestine without                            perforation or abscess without bleeding CPT copyright 2016 [employer]. All rights reserved. The codes documented in this report are preliminary and upon coder review may  be revised to meet current compliance requirements. Hildred Laser, MD Hildred Laser, MD 05/03/2017 9:50:59 AM This report has been signed electronically. Number of Addenda: 0

## 2017-05-03 NOTE — Progress Notes (Signed)
Patient having nausea and vomiting.  Reviewed with Dr. Laural Golden with verbal order of Zofran 4mg  Sublingual.  Family at side.

## 2017-05-03 NOTE — H&P (Signed)
Tracy Joseph is an 46 y.o. female.   Chief Complaint: Patient is here for colonoscopy. HPI: Patient is 46 year old African-American female who underwent colonoscopy 6 years ago for rectal bleeding and found to have large tubular adenoma. She now returns for surveillance exam. She denies abdominal pain change in bowel habits or rectal bleeding. Family history is negative for CRC.  Past Medical History:  Diagnosis Date  . Essential hypertension   . Goiter    Initially hyperthyroid treated with RAI, now hypothyroid - follows with Dr. [name]  . Obesity   . Obstructive sleep apnea    Suspected   . Prediabetes   . Seasonal allergies   . Tubular adenoma of colon     Past Surgical History:  Procedure Laterality Date  . TUBAL LIGATION  2005    Family History  Problem Relation Age of Onset  . Hypertension Mother   . Hyperlipidemia Mother   . Thyroid disease Paternal Grandmother   . Coronary artery disease Maternal Aunt   . Coronary artery disease Maternal Aunt    Social History:  reports that she has never smoked. She has never used smokeless tobacco. She reports that she does not drink alcohol or use drugs.  Allergies:  Allergies  Allergen Reactions  . Ace Inhibitors Cough  . Dust Mite Extract   . Pollen Extract     Medications Prior to Admission  Medication Sig Dispense Refill  . acetaminophen (TYLENOL) 500 MG tablet Take 1,000 mg by mouth daily as needed for headache.    Marland Kitchen amLODipine (NORVASC) 10 MG tablet TAKE 1 TABLET (10 MG TOTAL) BY MOUTH DAILY. 90 tablet 1  . cetirizine (ZYRTEC) 10 MG tablet Take 10 mg by mouth daily as needed for allergies.    Marland Kitchen ergocalciferol (VITAMIN D2) 50000 units capsule Take 1 capsule (50,000 Units total) by mouth once a week. One capsule once weekly (Patient taking differently: Take 50,000 Units by mouth every Monday. ) 12 capsule 1  . hydrALAZINE (APRESOLINE) 100 MG tablet TAKE 1 TABLET BY MOUTH TWICE A DAY 180 tablet 1  . levothyroxine  (SYNTHROID, LEVOTHROID) 150 MCG tablet Take 1 tablet (150 mcg total) by mouth daily. (Patient taking differently: Take 150 mcg by mouth daily before breakfast. ) 90 tablet 3  . Multiple Vitamin (MULTIVITAMIN) tablet Take 1 tablet by mouth daily.    . naproxen sodium (ANAPROX) 220 MG tablet Take 440 mg by mouth daily as needed (pain).    . sodium chloride (OCEAN) 0.65 % SOLN nasal spray Place 1 spray into both nostrils as needed for congestion.    . triamterene-hydrochlorothiazide (MAXZIDE-25) 37.5-25 MG tablet Take 1.5 tablets by mouth daily.  135 tablet 1    No results found for this or any previous visit (from the past 48 hour(s)). No results found.  ROS  Blood pressure (!) 163/93, pulse 97, temperature 98.4 F (36.9 C), temperature source Oral, resp. rate 16, height 5\' 5"  (1.651 m), weight 215 lb (97.5 kg), SpO2 99 %. Physical Exam  Constitutional: She appears well-developed and well-nourished.  HENT:  Mouth/Throat: Oropharynx is clear and moist.  Eyes: Conjunctivae are normal.  Neck: No thyromegaly present.  Cardiovascular: Normal rate and regular rhythm.  Gallop: faint systolic ejection murmur best heard at left sternal border.   Murmur heard. Respiratory: Effort normal and breath sounds normal.  GI:  Abdomen is full but soft and nontender without organomegaly or masses.  Lymphadenopathy:    She has no cervical adenopathy.  Neurological: She  is alert.  Skin: Skin is warm and dry.     Assessment/Plan History of colonic adenoma. Surveillance colonoscopy.  Hildred Laser, MD 05/03/2017, 9:17 AM

## 2017-05-04 ENCOUNTER — Encounter (HOSPITAL_COMMUNITY): Payer: Self-pay | Admitting: Internal Medicine

## 2017-05-07 ENCOUNTER — Ambulatory Visit (INDEPENDENT_AMBULATORY_CARE_PROVIDER_SITE_OTHER): Payer: BLUE CROSS/BLUE SHIELD | Admitting: Endocrinology

## 2017-05-07 ENCOUNTER — Encounter: Payer: Self-pay | Admitting: Endocrinology

## 2017-05-07 VITALS — BP 146/96 | HR 81 | Ht 66.0 in | Wt 233.0 lb

## 2017-05-07 DIAGNOSIS — E890 Postprocedural hypothyroidism: Secondary | ICD-10-CM

## 2017-05-07 LAB — TSH: TSH: 6.5 u[IU]/mL — AB (ref 0.35–4.50)

## 2017-05-07 MED ORDER — LEVOTHYROXINE SODIUM 175 MCG PO TABS: 175.0000 ug | ORAL_TABLET | Freq: Every day | ORAL | 3 refills | Status: DC

## 2017-05-07 NOTE — Patient Instructions (Addendum)
blood tests are requested for you today.  We'll let you know about the results.  Please see Dr Moshe Cipro soon, to recheck your blood pressure.  Please return in 1 year.

## 2017-05-07 NOTE — Progress Notes (Signed)
Subjective:    Patient ID: Tracy Joseph, female    DOB: [birth year], 46 y.o.   MRN: [id number]  HPI Pt returns for f/u of post-RAI hypothyroidism (small multinodular goiter was noted in 2010; when she was noted to have hyperthyroidism in 2012, she had RAI; in 2016, f/u US was slightly smaller).  Since synthroid was increased, she feels no different, and well in general.  She says she never misses the synthroid.  Past Medical History:  Diagnosis Date  . Essential hypertension   . Goiter    Initially hyperthyroid treated with RAI, now hypothyroid - follows with Dr. [name]  . Obesity   . Obstructive sleep apnea    Suspected   . Prediabetes   . Seasonal allergies   . Tubular adenoma of colon     Past Surgical History:  Procedure Laterality Date  . COLONOSCOPY N/A 05/03/2017   Procedure: COLONOSCOPY;  Surgeon: Rogene Houston, MD;  Location: [facility];  Service: Endoscopy;  Laterality: N/A;  930  . TUBAL LIGATION  2005    Social History   Social History  . Marital status: Married    Spouse name: N/A  . Number of children: 1  . Years of education: N/A   Occupational History  . full time student for [employer]    Social History Main Topics  . Smoking status: Never Smoker  . Smokeless tobacco: Never Used  . Alcohol use No  . Drug use: No  . Sexual activity: Yes   Other Topics Concern  . Not on file   Social History Narrative  . No narrative on file    Current Outpatient Prescriptions on File Prior to Visit  Medication Sig Dispense Refill  . acetaminophen (TYLENOL) 500 MG tablet Take 1,000 mg by mouth daily as needed for headache.    Marland Kitchen amLODipine (NORVASC) 10 MG tablet TAKE 1 TABLET (10 MG TOTAL) BY MOUTH DAILY. 90 tablet 1  . cetirizine (ZYRTEC) 10 MG tablet Take 10 mg by mouth daily as needed for allergies.    Marland Kitchen ergocalciferol (VITAMIN D2) 50000 units capsule Take 1 capsule (50,000 Units total) by mouth once a week. One capsule once weekly (Patient taking  differently: Take 50,000 Units by mouth every Monday. ) 12 capsule 1  . hydrALAZINE (APRESOLINE) 100 MG tablet TAKE 1 TABLET BY MOUTH TWICE A DAY 180 tablet 1  . Multiple Vitamin (MULTIVITAMIN) tablet Take 1 tablet by mouth daily.    . naproxen sodium (ANAPROX) 220 MG tablet Take 440 mg by mouth daily as needed (pain).    . sodium chloride (OCEAN) 0.65 % SOLN nasal spray Place 1 spray into both nostrils as needed for congestion.    . triamterene-hydrochlorothiazide (MAXZIDE-25) 37.5-25 MG tablet Take 1.5 tablets by mouth daily.  135 tablet 1   No current facility-administered medications on file prior to visit.     Allergies  Allergen Reactions  . Ace Inhibitors Cough  . Dust Mite Extract   . Pollen Extract     Family History  Problem Relation Age of Onset  . Hypertension Mother   . Hyperlipidemia Mother   . Thyroid disease Paternal Grandmother   . Coronary artery disease Maternal Aunt   . Coronary artery disease Maternal Aunt     BP (!) 146/96   Pulse 81   Ht 5\' 6"  (1.676 m)   Wt 233 lb (105.7 kg)   SpO2 98%   BMI 37.61 kg/m    Review of Systems  Denies weight change.     Objective:   Physical Exam VITAL SIGNS:  See vs page GENERAL: no distress NECK: There is no palpable thyroid enlargement.  No thyroid nodule is palpable.  No palpable lymphadenopathy at the anterior neck.    Lab Results  Component Value Date   TSH 6.50 (H) 05/07/2017   T4TOTAL 13.4 (H) 01/05/2011      Assessment & Plan:  HTN: he needs f/u Hypothyroidism: I have sent a prescription to your pharmacy, to increase synthroid.   Patient Instructions  blood tests are requested for you today.  We'll let you know about the results.  Please see Dr Moshe Cipro soon, to recheck your blood pressure.  Please return in 1 year.

## 2017-06-21 ENCOUNTER — Encounter: Payer: Self-pay | Admitting: Family Medicine

## 2017-06-21 ENCOUNTER — Ambulatory Visit (INDEPENDENT_AMBULATORY_CARE_PROVIDER_SITE_OTHER): Payer: BLUE CROSS/BLUE SHIELD | Admitting: Family Medicine

## 2017-06-21 VITALS — BP 160/120 | HR 85 | Temp 97.7°F | Ht 66.0 in | Wt 234.1 lb

## 2017-06-21 DIAGNOSIS — Z0000 Encounter for general adult medical examination without abnormal findings: Secondary | ICD-10-CM

## 2017-06-21 DIAGNOSIS — N920 Excessive and frequent menstruation with regular cycle: Secondary | ICD-10-CM

## 2017-06-21 DIAGNOSIS — I10 Essential (primary) hypertension: Secondary | ICD-10-CM

## 2017-06-21 DIAGNOSIS — R7301 Impaired fasting glucose: Secondary | ICD-10-CM | POA: Diagnosis not present

## 2017-06-21 NOTE — Patient Instructions (Addendum)
F/u in 4.5 weeks, call if you need me before Blood pressure is extremely high, and this is harmful to your health,VITAL that you take yourTtHREE blood pressure medications exactly prescribed     You are referred to gynecology re excessive bleeding and for pelviic exam  Non fast chem 7 and eGFR and hBA1C 1 week before visit  Start once daily dit D3 2000 iU     DASH Eating Plan DASH stands for "Dietary Approaches to Stop Hypertension." The DASH eating plan is a healthy eating plan that has been shown to reduce high blood pressure (hypertension). It may also reduce your risk for type 2 diabetes, heart disease, and stroke. The DASH eating plan may also help with weight loss. What are tips for following this plan? General guidelines  Avoid eating more than 2,300 mg (milligrams) of salt (sodium) a day. If you have hypertension, you may need to reduce your sodium intake to 1,500 mg a day.  Limit alcohol intake to no more than 1 drink a day for nonpregnant women and 2 drinks a day for men. One drink equals 12 oz of beer, 5 oz of wine, or 1 oz of hard liquor.  Work with your health care provider to maintain a healthy body weight or to lose weight. Ask what an ideal weight is for you.  Get at least 30 minutes of exercise that causes your heart to beat faster (aerobic exercise) most days of the week. Activities may include walking, swimming, or biking.  Work with your health care provider or diet and nutrition specialist (dietitian) to adjust your eating plan to your individual calorie needs. Reading food labels  Check food labels for the amount of sodium per serving. Choose foods with less than 5 percent of the Daily Value of sodium. Generally, foods with less than 300 mg of sodium per serving fit into this eating plan.  To find whole grains, look for the word "whole" as the first word in the ingredient list. Shopping  Buy products labeled as "low-sodium" or "no salt added."  Buy fresh  foods. Avoid canned foods and premade or frozen meals. Cooking  Avoid adding salt when cooking. Use salt-free seasonings or herbs instead of table salt or sea salt. Check with your health care provider or pharmacist before using salt substitutes.  Do not fry foods. Cook foods using healthy methods such as baking, boiling, grilling, and broiling instead.  Cook with heart-healthy oils, such as olive, canola, soybean, or sunflower oil. Meal planning   Eat a balanced diet that includes: ? 5 or more servings of fruits and vegetables each day. At each meal, try to fill half of your plate with fruits and vegetables. ? Up to 6-8 servings of whole grains each day. ? Less than 6 oz of lean meat, poultry, or fish each day. A 3-oz serving of meat is about the same size as a deck of cards. One egg equals 1 oz. ? 2 servings of low-fat dairy each day. ? A serving of nuts, seeds, or beans 5 times each week. ? Heart-healthy fats. Healthy fats called Omega-3 fatty acids are found in foods such as flaxseeds and coldwater fish, like sardines, salmon, and mackerel.  Limit how much you eat of the following: ? Canned or prepackaged foods. ? Food that is high in trans fat, such as fried foods. ? Food that is high in saturated fat, such as fatty meat. ? Sweets, desserts, sugary drinks, and other foods with added sugar. ?  Full-fat dairy products.  Do not salt foods before eating.  Try to eat at least 2 vegetarian meals each week.  Eat more home-cooked food and less restaurant, buffet, and fast food.  When eating at a restaurant, ask that your food be prepared with less salt or no salt, if possible. What foods are recommended? The items listed may not be a complete list. Talk with your dietitian about what dietary choices are best for you. Grains Whole-grain or whole-wheat bread. Whole-grain or whole-wheat pasta. Brown rice. [place]. Bulgur. Whole-grain and low-sodium cereals. Pita bread. Low-fat,  low-sodium crackers. Whole-wheat flour tortillas. Vegetables Fresh or frozen vegetables (raw, steamed, roasted, or grilled). Low-sodium or reduced-sodium tomato and vegetable juice. Low-sodium or reduced-sodium tomato sauce and tomato paste. Low-sodium or reduced-sodium canned vegetables. Fruits All fresh, dried, or frozen fruit. Canned fruit in natural juice (without added sugar). Meat and other protein foods Skinless chicken or [place]. Ground chicken or [place]. Pork with fat trimmed off. Fish and seafood. Egg whites. Dried beans, peas, or lentils. Unsalted nuts, nut butters, and seeds. Unsalted canned beans. Lean cuts of beef with fat trimmed off. Low-sodium, lean deli meat. Dairy Low-fat (1%) or fat-free (skim) milk. Fat-free, low-fat, or reduced-fat cheeses. Nonfat, low-sodium ricotta or cottage cheese. Low-fat or nonfat yogurt. Low-fat, low-sodium cheese. Fats and oils Soft margarine without trans fats. Vegetable oil. Low-fat, reduced-fat, or light mayonnaise and salad dressings (reduced-sodium). Canola, safflower, olive, soybean, and sunflower oils. Avocado. Seasoning and other foods Herbs. Spices. Seasoning mixes without salt. Unsalted popcorn and pretzels. Fat-free sweets. What foods are not recommended? The items listed may not be a complete list. Talk with your dietitian about what dietary choices are best for you. Grains Baked goods made with fat, such as croissants, muffins, or some breads. Dry pasta or rice meal packs. Vegetables Creamed or fried vegetables. Vegetables in a cheese sauce. Regular canned vegetables (not low-sodium or reduced-sodium). Regular canned tomato sauce and paste (not low-sodium or reduced-sodium). Regular tomato and vegetable juice (not low-sodium or reduced-sodium). Angie Fava. Olives. Fruits Canned fruit in a light or heavy syrup. Fried fruit. Fruit in cream or butter sauce. Meat and other protein foods Fatty cuts of meat. Ribs. Fried meat. Berniece Salines. Sausage.  Bologna and other processed lunch meats. Salami. Fatback. Hotdogs. Bratwurst. Salted nuts and seeds. Canned beans with added salt. Canned or smoked fish. Whole eggs or egg yolks. Chicken or [place] with skin. Dairy Whole or 2% milk, cream, and half-and-half. Whole or full-fat cream cheese. Whole-fat or sweetened yogurt. Full-fat cheese. Nondairy creamers. Whipped toppings. Processed cheese and cheese spreads. Fats and oils Butter. Stick margarine. Lard. Shortening. Ghee. Bacon fat. Tropical oils, such as coconut, palm kernel, or palm oil. Seasoning and other foods Salted popcorn and pretzels. Onion salt, garlic salt, seasoned salt, table salt, and sea salt. Worcestershire sauce. Tartar sauce. Barbecue sauce. Teriyaki sauce. Soy sauce, including reduced-sodium. Steak sauce. Canned and packaged gravies. Fish sauce. Oyster sauce. Cocktail sauce. Horseradish that you find on the shelf. Ketchup. Mustard. Meat flavorings and tenderizers. Bouillon cubes. Hot sauce and Tabasco sauce. Premade or packaged marinades. Premade or packaged taco seasonings. Relishes. Regular salad dressings. Where to find more information:  National Heart, Lung, and [place]: https://wilson-eaton.com/  American Heart Association: www.heart.org Summary  The DASH eating plan is a healthy eating plan that has been shown to reduce high blood pressure (hypertension). It may also reduce your risk for type 2 diabetes, heart disease, and stroke.  With the DASH eating plan, you  should limit salt (sodium) intake to 2,300 mg a day. If you have hypertension, you may need to reduce your sodium intake to 1,500 mg a day.  When on the DASH eating plan, aim to eat more fresh fruits and vegetables, whole grains, lean proteins, low-fat dairy, and heart-healthy fats.  Work with your health care provider or diet and nutrition specialist (dietitian) to adjust your eating plan to your individual calorie needs. This information is not intended to  replace advice given to you by your health care provider. Make sure you discuss any questions you have with your health care provider. Document Released: 11/23/2011 Document Revised: 11/27/2016 Document Reviewed: 11/27/2016 Elsevier Interactive Patient Education  2017 Elsevier Inc.  Hypertension Hypertension is another name for high blood pressure. High blood pressure forces your heart to work harder to pump blood. This can cause problems over time. There are two numbers in a blood pressure reading. There is a top number (systolic) over a bottom number (diastolic). It is best to have a blood pressure below 120/80. Healthy choices can help lower your blood pressure. You may need medicine to help lower your blood pressure if:  Your blood pressure cannot be lowered with healthy choices.  Your blood pressure is higher than 130/80.  Follow these instructions at home: Eating and drinking  If directed, follow the DASH eating plan. This diet includes: ? Filling half of your plate at each meal with fruits and vegetables. ? Filling one quarter of your plate at each meal with whole grains. Whole grains include whole wheat pasta, brown rice, and whole grain bread. ? Eating or drinking low-fat dairy products, such as skim milk or low-fat yogurt. ? Filling one quarter of your plate at each meal with low-fat (lean) proteins. Low-fat proteins include fish, skinless chicken, eggs, beans, and tofu. ? Avoiding fatty meat, cured and processed meat, or chicken with skin. ? Avoiding premade or processed food.  Eat less than 1,500 mg of salt (sodium) a day.  Limit alcohol use to no more than 1 drink a day for nonpregnant women and 2 drinks a day for men. One drink equals 12 oz of beer, 5 oz of wine, or 1 oz of hard liquor. Lifestyle  Work with your doctor to stay at a healthy weight or to lose weight. Ask your doctor what the best weight is for you.  Get at least 30 minutes of exercise that causes your  heart to beat faster (aerobic exercise) most days of the week. This may include walking, swimming, or biking.  Get at least 30 minutes of exercise that strengthens your muscles (resistance exercise) at least 3 days a week. This may include lifting weights or pilates.  Do not use any products that contain nicotine or tobacco. This includes cigarettes and e-cigarettes. If you need help quitting, ask your doctor.  Check your blood pressure at home as told by your doctor.  Keep all follow-up visits as told by your doctor. This is important. Medicines  Take over-the-counter and prescription medicines only as told by your doctor. Follow directions carefully.  Do not skip doses of blood pressure medicine. The medicine does not work as well if you skip doses. Skipping doses also puts you at risk for problems.  Ask your doctor about side effects or reactions to medicines that you should watch for. Contact a doctor if:  You think you are having a reaction to the medicine you are taking.  You have headaches that keep coming back (recurring).  You feel dizzy.  You have swelling in your ankles.  You have trouble with your vision. Get help right away if:  You get a very bad headache.  You start to feel confused.  You feel weak or numb.  You feel faint.  You get very bad pain in your: ? Chest. ? Belly (abdomen).  You throw up (vomit) more than once.  You have trouble breathing. Summary  Hypertension is another name for high blood pressure.  Making healthy choices can help lower blood pressure. If your blood pressure cannot be controlled with healthy choices, you may need to take medicine. This information is not intended to replace advice given to you by your health care provider. Make sure you discuss any questions you have with your health care provider. Document Released: 05/22/2008 Document Revised: 11/01/2016 Document Reviewed: 11/01/2016 Elsevier Interactive Patient  Education  [employer].

## 2017-06-21 NOTE — Assessment & Plan Note (Signed)
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
## 2017-06-21 NOTE — Assessment & Plan Note (Signed)
Uncontrolled , non compliant with medication. Correct way to take medication is reviewed F/u in 4 to5 weeks DASH diet and commitment to daily physical activity for a minimum of 30 minutes discussed and encouraged, as a part of hypertension management. The importance of attaining a healthy weight is also discussed.  BP/Weight 06/21/2017 05/07/2017 05/03/2017 01/24/2017 06/14/2016 03/24/3957 03/22/1711  Systolic BP 787 183 672 550 016 429 037  Diastolic BP 955 96 97 96 92 100 100  Wt. (Lbs) 234.08 233 215 236 234 236.8 237  BMI 37.78 37.61 35.78 38.09 37.79 38.24 38.27
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
## 2017-07-05 ENCOUNTER — Ambulatory Visit (INDEPENDENT_AMBULATORY_CARE_PROVIDER_SITE_OTHER): Payer: BLUE CROSS/BLUE SHIELD | Admitting: Women's Health

## 2017-07-05 ENCOUNTER — Encounter: Payer: Self-pay | Admitting: Women's Health

## 2017-07-05 VITALS — BP 144/96 | Ht 65.0 in | Wt 236.0 lb

## 2017-07-05 DIAGNOSIS — E038 Other specified hypothyroidism: Secondary | ICD-10-CM

## 2017-07-05 DIAGNOSIS — E559 Vitamin D deficiency, unspecified: Secondary | ICD-10-CM

## 2017-07-05 DIAGNOSIS — Z01419 Encounter for gynecological examination (general) (routine) without abnormal findings: Secondary | ICD-10-CM | POA: Diagnosis not present

## 2017-07-05 MED ORDER — ERGOCALCIFEROL 1.25 MG (50000 UT) PO CAPS
50000.0000 [IU] | ORAL_CAPSULE | ORAL | 1 refills | Status: DC
Start: 2017-07-05 — End: 2019-07-08

## 2017-07-05 MED ORDER — ERGOCALCIFEROL 1.25 MG (50000 UT) PO CAPS: 50000.0000 [IU] | ORAL_CAPSULE | ORAL | 4 refills | Status: DC

## 2017-07-05 NOTE — Progress Notes (Signed)
Tracy Joseph March 07, 1971 [id number]    History:    Presents for new patient annual exam.  Monthly heavy 7 day cycles BTL,  normal CBC. Reports normal Pap and mammogram history. Primary care manages hypothyroidism and hypertension. 2018 Benign colon polyp on colonoscopy.  Past medical history, past surgical history, family history and social history were all reviewed and documented in the EPIC chart. Works at [employer]. One daughter and 51-year-old grandson.   ROS:  A ROS was performed and pertinent positives and negatives are included.  Exam:  Vitals:   07/05/17 1058  BP: (!) 144/96  Weight: 236 lb (107 kg)  Height: 5\' 5"  (1.651 m)   Body mass index is 39.27 kg/m.   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 Respiratory  Auscultation:  Clear without wheezing or rhonchi Cardiovascular  Auscultation:  Regular rate, without rubs, murmurs or gallops  Edema/varicosities:  Not grossly evident Abdominal  Soft,nontender, without masses, guarding or rebound.  Liver/spleen:  No organomegaly noted  Hernia:  None appreciated  Skin  Inspection:  Grossly normal   Breasts: Examined lying and sitting.     Right: Without masses, retractions, discharge or axillary adenopathy.     Left: Without masses, retractions, discharge or axillary adenopathy. Gentitourinary   Inguinal/mons:  Normal without inguinal adenopathy  External genitalia:  Normal  BUS/Urethra/Skene's glands:  Normal  Vagina:  Normal  Cervix:  Normal  Uterus:  normal in size, shape and contour.  Midline and mobile  Adnexa/parametria:     Rt: Without masses or tenderness.   Lt: Without masses or tenderness.  Anus and perineum: Normal  Digital rectal exam: Normal sphincter tone without palpated masses or tenderness  Assessment/Plan:  46 y.o. MBF G4 P1 for annual exam.    Monthly 7 day cycle/BTL Hypertension/hypothyroid-primary care manages labs and meds History of  vitamin D deficiency Obesity  Plan: Reviewed blood pressure slightly elevated, instructed to follow-up with primary care if continues greater than 130/80. Vitamin D 50,000 IUs weekly for 12 weeks and then over-the-counter 2000 IUs daily. Was prescribed vitamin D 50,000, only took several doses. SBEs, continue annual screening mammogram, calcium rich diet, and increase exercise and decrease calories/carbs for weight loss encouraged. Pap normal 2017, new screening guidelines reviewed. TSH, due for recheck.   Huel Cote [facility], 4:40 PM 07/05/2017

## 2017-07-05 NOTE — Patient Instructions (Addendum)
Health Maintenance, Female Adopting a healthy lifestyle and getting preventive care can go a long way to promote health and wellness. Talk with your health care provider about what schedule of regular examinations is right for you. This is a good chance for you to check in with your provider about disease prevention and staying healthy. In between checkups, there are plenty of things you can do on your own. Experts have done a lot of research about which lifestyle changes and preventive measures are most likely to keep you healthy. Ask your health care provider for more information. Weight and diet Eat a healthy diet  Be sure to include plenty of vegetables, fruits, low-fat dairy products, and lean protein.  Do not eat a lot of foods high in solid fats, added sugars, or salt.  Get regular exercise. This is one of the most important things you can do for your health. ? Most adults should exercise for at least 150 minutes each week. The exercise should increase your heart rate and make you sweat (moderate-intensity exercise). ? Most adults should also do strengthening exercises at least twice a week. This is in addition to the moderate-intensity exercise.  Maintain a healthy weight  Body mass index (BMI) is a measurement that can be used to identify possible weight problems. It estimates body fat based on height and weight. Your health care provider can help determine your BMI and help you achieve or maintain a healthy weight.  For females 20 years of age and older: ? A BMI below 18.5 is considered underweight. ? A BMI of 18.5 to 24.9 is normal. ? A BMI of 25 to 29.9 is considered overweight. ? A BMI of 30 and above is considered obese.  Watch levels of cholesterol and blood lipids  You should start having your blood tested for lipids and cholesterol at 47 years of age, then have this test every 5 years.  You may need to have your cholesterol levels checked more often if: ? Your lipid or  cholesterol levels are high. ? You are older than 47 years of age. ? You are at high risk for heart disease.  Cancer screening Lung Cancer  Lung cancer screening is recommended for adults 55-80 years old who are at high risk for lung cancer because of a history of smoking.  A yearly low-dose CT scan of the lungs is recommended for people who: ? Currently smoke. ? Have quit within the past 15 years. ? Have at least a 30-pack-year history of smoking. A pack year is smoking an average of one pack of cigarettes a day for 1 year.  Yearly screening should continue until it has been 15 years since you quit.  Yearly screening should stop if you develop a health problem that would prevent you from having lung cancer treatment.  Breast Cancer  Practice breast self-awareness. This means understanding how your breasts normally appear and feel.  It also means doing regular breast self-exams. Let your health care provider know about any changes, no matter how small.  If you are in your 20s or 30s, you should have a clinical breast exam (CBE) by a health care provider every 1-3 years as part of a regular health exam.  If you are 40 or older, have a CBE every year. Also consider having a breast X-ray (mammogram) every year.  If you have a family history of breast cancer, talk to your health care provider about genetic screening.  If you are at high risk   for breast cancer, talk to your health care provider about having an MRI and a mammogram every year.  Breast cancer gene (BRCA) assessment is recommended for women who have family members with BRCA-related cancers. BRCA-related cancers include: ? Breast. ? Ovarian. ? Tubal. ? Peritoneal cancers.  Results of the assessment will determine the need for genetic counseling and BRCA1 and BRCA2 testing.  Cervical Cancer Your health care provider may recommend that you be screened regularly for cancer of the pelvic organs (ovaries, uterus, and  vagina). This screening involves a pelvic examination, including checking for microscopic changes to the surface of your cervix (Pap test). You may be encouraged to have this screening done every 3 years, beginning at age 22.  For women ages 56-65, health care providers may recommend pelvic exams and Pap testing every 3 years, or they may recommend the Pap and pelvic exam, combined with testing for human papilloma virus (HPV), every 5 years. Some types of HPV increase your risk of cervical cancer. Testing for HPV may also be done on women of any age with unclear Pap test results.  Other health care providers may not recommend any screening for nonpregnant women who are considered low risk for pelvic cancer and who do not have symptoms. Ask your health care provider if a screening pelvic exam is right for you.  If you have had past treatment for cervical cancer or a condition that could lead to cancer, you need Pap tests and screening for cancer for at least 20 years after your treatment. If Pap tests have been discontinued, your risk factors (such as having a new sexual partner) need to be reassessed to determine if screening should resume. Some women have medical problems that increase the chance of getting cervical cancer. In these cases, your health care provider may recommend more frequent screening and Pap tests.  Colorectal Cancer  This type of cancer can be detected and often prevented.  Routine colorectal cancer screening usually begins at 47 years of age and continues through 47 years of age.  Your health care provider may recommend screening at an earlier age if you have risk factors for colon cancer.  Your health care provider may also recommend using home test kits to check for hidden blood in the stool.  A small camera at the end of a tube can be used to examine your colon directly (sigmoidoscopy or colonoscopy). This is done to check for the earliest forms of colorectal  cancer.  Routine screening usually begins at age 33.  Direct examination of the colon should be repeated every 5-10 years through 47 years of age. However, you may need to be screened more often if early forms of precancerous polyps or small growths are found.  Skin Cancer  Check your skin from head to toe regularly.  Tell your health care provider about any new moles or changes in moles, especially if there is a change in a mole's shape or color.  Also tell your health care provider if you have a mole that is larger than the size of a pencil eraser.  Always use sunscreen. Apply sunscreen liberally and repeatedly throughout the day.  Protect yourself by wearing long sleeves, pants, a wide-brimmed hat, and sunglasses whenever you are outside.  Heart disease, diabetes, and high blood pressure  High blood pressure causes heart disease and increases the risk of stroke. High blood pressure is more likely to develop in: ? People who have blood pressure in the high end of  the normal range (130-139/85-89 mm Hg). ? People who are overweight or obese. ? People who are African American.  If you are 21-29 years of age, have your blood pressure checked every 3-5 years. If you are 3 years of age or older, have your blood pressure checked every year. You should have your blood pressure measured twice-once when you are at a hospital or clinic, and once when you are not at a hospital or clinic. Record the average of the two measurements. To check your blood pressure when you are not at a hospital or clinic, you can use: ? An automated blood pressure machine at a pharmacy. ? A home blood pressure monitor.  If you are between 17 years and 37 years old, ask your health care provider if you should take aspirin to prevent strokes.  Have regular diabetes screenings. This involves taking a blood sample to check your fasting blood sugar level. ? If you are at a normal weight and have a low risk for diabetes,  have this test once every three years after 47 years of age. ? If you are overweight and have a high risk for diabetes, consider being tested at a younger age or more often. Preventing infection Hepatitis B  If you have a higher risk for hepatitis B, you should be screened for this virus. You are considered at high risk for hepatitis B if: ? You were born in a country where hepatitis B is common. Ask your health care provider which countries are considered high risk. ? Your parents were born in a high-risk country, and you have not been immunized against hepatitis B (hepatitis B vaccine). ? You have HIV or AIDS. ? You use needles to inject street drugs. ? You live with someone who has hepatitis B. ? You have had sex with someone who has hepatitis B. ? You get hemodialysis treatment. ? You take certain medicines for conditions, including cancer, organ transplantation, and autoimmune conditions.  Hepatitis C  Blood testing is recommended for: ? Everyone born from [birth year] through 1966. ? Anyone with known risk factors for hepatitis C.  Sexually transmitted infections (STIs)  You should be screened for sexually transmitted infections (STIs) including gonorrhea and chlamydia if: ? You are sexually active and are younger than 47 years of age. ? You are older than 47 years of age and your health care provider tells you that you are at risk for this type of infection. ? Your sexual activity has changed since you were last screened and you are at an increased risk for chlamydia or gonorrhea. Ask your health care provider if you are at risk.  If you do not have HIV, but are at risk, it may be recommended that you take a prescription medicine daily to prevent HIV infection. This is called pre-exposure prophylaxis (PrEP). You are considered at risk if: ? You are sexually active and do not regularly use condoms or know the HIV status of your partner(s). ? You take drugs by injection. ? You are  sexually active with a partner who has HIV.  Talk with your health care provider about whether you are at high risk of being infected with HIV. If you choose to begin PrEP, you should first be tested for HIV. You should then be tested every 3 months for as long as you are taking PrEP. Pregnancy  If you are premenopausal and you may become pregnant, ask your health care provider about preconception counseling.  If you may become  pregnant, take 400 to 800 micrograms (mcg) of folic acid every day.  If you want to prevent pregnancy, talk to your health care provider about birth control (contraception). Osteoporosis and menopause  Osteoporosis is a disease in which the bones lose minerals and strength with aging. This can result in serious bone fractures. Your risk for osteoporosis can be identified using a bone density scan.  If you are 65 years of age or older, or if you are at risk for osteoporosis and fractures, ask your health care provider if you should be screened.  Ask your health care provider whether you should take a calcium or vitamin D supplement to lower your risk for osteoporosis.  Menopause may have certain physical symptoms and risks.  Hormone replacement therapy may reduce some of these symptoms and risks. Talk to your health care provider about whether hormone replacement therapy is right for you. Follow these instructions at home:  Schedule regular health, dental, and eye exams.  Stay current with your immunizations.  Do not use any tobacco products including cigarettes, chewing tobacco, or electronic cigarettes.  If you are pregnant, do not drink alcohol.  If you are breastfeeding, limit how much and how often you drink alcohol.  Limit alcohol intake to no more than 1 drink per day for nonpregnant women. One drink equals 12 ounces of beer, 5 ounces of wine, or 1 ounces of hard liquor.  Do not use street drugs.  Do not share needles.  Ask your health care  provider for help if you need support or information about quitting drugs.  Tell your health care provider if you often feel depressed.  Tell your health care provider if you have ever been abused or do not feel safe at home. This information is not intended to replace advice given to you by your health care provider. Make sure you discuss any questions you have with your health care provider. Document Released: 06/19/2011 Document Revised: 05/11/2016 Document Reviewed: 09/07/2015 Elsevier Interactive Patient Education  2018 [employer] Carbohydrate Counting for Diabetes Mellitus, Adult Carbohydrate counting is a method for keeping track of how many carbohydrates you eat. Eating carbohydrates naturally increases the amount of sugar (glucose) in the blood. Counting how many carbohydrates you eat helps keep your blood glucose within normal limits, which helps you manage your diabetes (diabetes mellitus). It is important to know how many carbohydrates you can safely have in each meal. This is different for every person. A diet and nutrition specialist (registered dietitian) can help you make a meal plan and calculate how many carbohydrates you should have at each meal and snack. Carbohydrates are found in the following foods:  Grains, such as breads and cereals.  Dried beans and soy products.  Starchy vegetables, such as potatoes, peas, and corn.  Fruit and fruit juices.  Milk and yogurt.  Sweets and snack foods, such as cake, cookies, candy, chips, and soft drinks.  How do I count carbohydrates? There are two ways to count carbohydrates in food. You can use either of the methods or a combination of both. Reading "Nutrition Facts" on packaged food The "Nutrition Facts" list is included on the labels of almost all packaged foods and beverages in the [place] It includes:  The serving size.  Information about nutrients in each serving, including the grams (g) of carbohydrate per  serving.  To use the "Nutrition Facts":  Decide how many servings you will have.  Multiply the number of servings by the number of carbohydrates   per serving.  The resulting number is the total amount of carbohydrates that you will be having.  Learning standard serving sizes of other foods When you eat foods containing carbohydrates that are not packaged or do not include "Nutrition Facts" on the label, you need to measure the servings in order to count the amount of carbohydrates:  Measure the foods that you will eat with a food scale or measuring cup, if needed.  Decide how many standard-size servings you will eat.  Multiply the number of servings by 15. Most carbohydrate-rich foods have about 15 g of carbohydrates per serving. ? For example, if you eat 8 oz (170 g) of strawberries, you will have eaten 2 servings and 30 g of carbohydrates (2 servings x 15 g = 30 g).  For foods that have more than one food mixed, such as soups and casseroles, you must count the carbohydrates in each food that is included.  The following list contains standard serving sizes of common carbohydrate-rich foods. Each of these servings has about 15 g of carbohydrates:   hamburger bun or  English muffin.   oz (15 mL) syrup.   oz (14 g) jelly.  1 slice of bread.  1 six-inch tortilla.  3 oz (85 g) cooked rice or pasta.  4 oz (113 g) cooked dried beans.  4 oz (113 g) starchy vegetable, such as peas, corn, or potatoes.  4 oz (113 g) hot cereal.  4 oz (113 g) mashed potatoes or  of a large baked potato.  4 oz (113 g) canned or frozen fruit.  4 oz (120 mL) fruit juice.  4-6 crackers.  6 chicken nuggets.  6 oz (170 g) unsweetened dry cereal.  6 oz (170 g) plain fat-free yogurt or yogurt sweetened with artificial sweeteners.  8 oz (240 mL) milk.  8 oz (170 g) fresh fruit or one small piece of fruit.  24 oz (680 g) popped popcorn.  Example of carbohydrate counting Sample meal  3  oz (85 g) chicken breast.  6 oz (170 g) brown rice.  4 oz (113 g) corn.  8 oz (240 mL) milk.  8 oz (170 g) strawberries with sugar-free whipped topping. Carbohydrate calculation 1. Identify the foods that contain carbohydrates: ? Rice. ? Corn. ? Milk. ? Strawberries. 2. Calculate how many servings you have of each food: ? 2 servings rice. ? 1 serving corn. ? 1 serving milk. ? 1 serving strawberries. 3. Multiply each number of servings by 15 g: ? 2 servings rice x 15 g = 30 g. ? 1 serving corn x 15 g = 15 g. ? 1 serving milk x 15 g = 15 g. ? 1 serving strawberries x 15 g = 15 g. 4. Add together all of the amounts to find the total grams of carbohydrates eaten: ? 30 g + 15 g + 15 g + 15 g = 75 g of carbohydrates total. This information is not intended to replace advice given to you by your health care provider. Make sure you discuss any questions you have with your health care provider. Document Released: 12/04/2005 Document Revised: 06/23/2016 Document Reviewed: 05/17/2016 Elsevier Interactive Patient Education  [employer].

## 2017-07-06 LAB — TSH: TSH: 5.25 m[IU]/L — AB

## 2017-07-24 ENCOUNTER — Ambulatory Visit: Payer: BLUE CROSS/BLUE SHIELD | Admitting: Family Medicine

## 2017-09-01 ENCOUNTER — Other Ambulatory Visit: Payer: Self-pay | Admitting: Family Medicine

## 2017-09-03 NOTE — Telephone Encounter (Signed)
Seen [phone]

## 2017-09-21 ENCOUNTER — Other Ambulatory Visit: Payer: Self-pay | Admitting: Family Medicine

## 2017-09-21 DIAGNOSIS — I10 Essential (primary) hypertension: Secondary | ICD-10-CM

## 2018-02-18 ENCOUNTER — Encounter: Payer: Self-pay | Admitting: Family Medicine

## 2018-02-18 ENCOUNTER — Ambulatory Visit (INDEPENDENT_AMBULATORY_CARE_PROVIDER_SITE_OTHER): Payer: Self-pay | Admitting: Family Medicine

## 2018-02-18 VITALS — BP 170/108 | HR 84 | Resp 16 | Ht 65.0 in | Wt 236.0 lb

## 2018-02-18 DIAGNOSIS — G4733 Obstructive sleep apnea (adult) (pediatric): Secondary | ICD-10-CM

## 2018-02-18 DIAGNOSIS — I10 Essential (primary) hypertension: Secondary | ICD-10-CM

## 2018-02-18 DIAGNOSIS — R002 Palpitations: Secondary | ICD-10-CM

## 2018-02-18 DIAGNOSIS — R079 Chest pain, unspecified: Secondary | ICD-10-CM

## 2018-02-18 DIAGNOSIS — E8881 Metabolic syndrome: Secondary | ICD-10-CM

## 2018-02-18 DIAGNOSIS — R7302 Impaired glucose tolerance (oral): Secondary | ICD-10-CM

## 2018-02-18 DIAGNOSIS — E785 Hyperlipidemia, unspecified: Secondary | ICD-10-CM

## 2018-02-18 MED ORDER — TRIAMTERENE-HCTZ 37.5-25 MG PO TABS: ORAL_TABLET | ORAL | 3 refills | Status: DC

## 2018-02-18 NOTE — Patient Instructions (Signed)
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 New chest painwith activity fo r 2 month, palpitation, poor exercise tolerance, and you also will be be referred for sleep study  CBC, fasting lipid, cmp and EGFR, HBA1c and Vit D this week please  .  Schedule meds 10 am and 10 pm  Increased triamterene to two daily  Thank you  for choosing [facility]. We consider it a privelige to serve you.  Delivering excellent health care in a caring and  compassionate way is our goal.  Partnering with you,  so that together we can achieve this goal is our strategy.

## 2018-02-24 ENCOUNTER — Encounter: Payer: Self-pay | Admitting: Family Medicine

## 2018-02-24 DIAGNOSIS — E782 Mixed hyperlipidemia: Secondary | ICD-10-CM | POA: Insufficient documentation

## 2018-02-24 DIAGNOSIS — R7302 Impaired glucose tolerance (oral): Secondary | ICD-10-CM | POA: Insufficient documentation

## 2018-02-24 DIAGNOSIS — E785 Hyperlipidemia, unspecified: Secondary | ICD-10-CM | POA: Insufficient documentation

## 2018-02-24 DIAGNOSIS — E8881 Metabolic syndrome: Secondary | ICD-10-CM | POA: Insufficient documentation

## 2018-02-24 DIAGNOSIS — E7849 Other hyperlipidemia: Secondary | ICD-10-CM | POA: Insufficient documentation

## 2018-02-24 DIAGNOSIS — R002 Palpitations: Secondary | ICD-10-CM | POA: Insufficient documentation

## 2018-02-24 NOTE — Assessment & Plan Note (Signed)
Hyperlipidemia:Low fat diet discussed and encouraged.   Lipid Panel  Lab Results  Component Value Date   CHOL 199 01/24/2017   HDL 53 01/24/2017   LDLCALC 123 (H) 01/24/2017   TRIG 115 01/24/2017   CHOLHDL 3.8 01/24/2017     Updated lab needed at/ before next visit.

## 2018-02-24 NOTE — Progress Notes (Signed)
Tracy Joseph     MRN: [id number]      DOB: 07-Jun-1971   HPI Tracy Joseph is here for follow up and re-evaluation of chronic medical conditions, medication management and review of any available recent lab and radiology data.  Preventive health is updated, needs mammogram. Takes amlodipine inconsistently , concerned about safety. C/o intermittent chest tightness, palpitations and reduced exercise tolerance , noted to be increased in past 2 to 3 months. Had started cardiology evaluation several years ago but did not complete.Ready now. Labs are also past due as is her follow up, she has uncontrolled hypertension, and increased CV risk factors due to metabolic syndrome ROS Denies recent fever or chills. Denies sinus pressure, nasal congestion, ear pain or sore throat. Denies chest congestion, productive cough or wheezing. Denies PND, orthopnea  and leg swelling Denies abdominal pain, nausea, vomiting,diarrhea or constipation.   Denies dysuria, frequency, hesitancy or incontinence. Denies joint pain, swelling and limitation in mobility. Denies headaches, seizures, numbness, or tingling. Denies depression, anxiety or insomnia. Denies skin break down or rash.   PE  BP (!) 170/108   Pulse 84   Resp 16   Ht 5\' 5"  (1.651 m)   Wt 236 lb (107 kg)   SpO2 93%   BMI 39.27 kg/m   Patient alert and oriented and in no cardiopulmonary distress.  HEENT: No facial asymmetry, EOMI,   oropharynx pink and moist.  Neck supple no JVD, no mass.  Chest: Clear to auscultation bilaterally.  CVS: S1, S2 no murmurs, no S3.Regular rate.  ABD: Soft non tender.   Ext: No edema  MS: Adequate ROM spine, shoulders, hips and knees.  Skin: Intact, no ulcerations or rash noted.  Psych: Good eye contact, normal affect. Memory intact  anxious not  depressed appearing.  CNS: CN 2-12 intact, power,  normal throughout.no focal deficits noted.   Assessment & Plan  Essential hypertension,  malignant Uncontrolled, non compliant , re educated re need to change behavior consistently. Medication prescribed and reviewed with patient , safety concerns addressed re meds.Nurse BP check in 1 to 2 weeks, refer to cardiology also as she has CV complaints DASH diet and commitment to daily physical activity for a minimum of 30 minutes discussed and encouraged, as a part of hypertension management. The importance of attaining a healthy weight is also discussed.  BP/Weight 02/18/2018 07/05/2017 06/21/2017 05/07/2017 05/03/2017 01/24/2017 2/45/8099  Systolic BP 833 825 053 976 734 193 790  Diastolic BP 240 96 973 96 97 96 92  Wt. (Lbs) 236 236 234.08 233 215 236 234  BMI 39.27 39.27 37.78 37.61 35.78 38.09 37.79       Hyperlipidemia LDL goal <100 Hyperlipidemia:Low fat diet discussed and encouraged.   Lipid Panel  Lab Results  Component Value Date   CHOL 199 01/24/2017   HDL 53 01/24/2017   LDLCALC 123 (H) 01/24/2017   TRIG 115 01/24/2017   CHOLHDL 3.8 01/24/2017     Updated lab needed at/ before next visit.   IGT (impaired glucose tolerance) Patient educated about the importance of limiting  Carbohydrate intake , the need to commit to daily physical activity for a minimum of 30 minutes , and to commit weight loss. The fact that changes in all these areas will reduce or eliminate all together the development of diabetes is stressed.   Diabetic Labs Latest Ref Rng & Units 01/24/2017 06/14/2016 12/15/2015 08/10/2015 04/06/2015  HbA1c <5.7 % 6.0(H) 5.9(H) 6.3(H) 6.3(H) 6.0(H)  Chol <200 mg/dL  199 190 - - 194  HDL >50 mg/dL 53 45(L) - - 57  Calc LDL <100 mg/dL 123(H) 113 - - 112(H)  Triglycerides <150 mg/dL 115 160(H) - - 124  Creatinine 0.50 - 1.10 mg/dL 1.11(H) 1.02 0.88 0.97 0.99   BP/Weight 02/18/2018 07/05/2017 06/21/2017 05/07/2017 05/03/2017 01/24/2017 9/74/1638  Systolic BP 453 646 803 212 248 250 037  Diastolic BP 048 96 889 96 97 96 92  Wt. (Lbs) 236 236 234.08 233 215 236 234  BMI  39.27 39.27 37.78 37.61 35.78 38.09 37.79    Updated lab needed .   Obesity, Class II, BMI 35.0-39.9, with comorbidity (see actual BMI) Unchanged Patient re-educated about  the importance of commitment to a  minimum of 150 minutes of exercise per week.  The importance of healthy food choices with portion control discussed. Encouraged to start a food diary, count calories and to consider  joining a support group. Sample diet sheets offered. Goals set by the patient for the next several months.   Weight /BMI 02/18/2018 07/05/2017 06/21/2017  WEIGHT 236 lb 236 lb 234 lb 1.3 oz  HEIGHT 5\' 5"  5\' 5"  5\' 6"   BMI 39.27 kg/m2 39.27 kg/m2 37.78 kg/m2      Metabolic syndrome X The increased risk of cardiovascular disease associated with this diagnosis, and the need to consistently work on lifestyle to change this is discussed. Following  a  heart healthy diet ,commitment to 30 minutes of exercise at least 5 days per week, as well as control of blood sugar and cholesterol , and achieving a healthy weight are all the areas to be addressed .   Chest pain on exertion 2 month h/o increased exertional chest pain and palpitation in pt with multiple CV risk factors, needs cardiology evaluation and she agrees to follow through on this, also has uncontrolled malignant hypertension  Palpitations 2 month h/o palpitations in pt with heart disease needs cardiology eval  Obstructive sleep apnea Snoring with chronic fatigue and uncontrolled hypertension, needs eval and treatment, refer to pulmonary

## 2018-02-24 NOTE — Assessment & Plan Note (Signed)
Snoring with chronic fatigue and uncontrolled hypertension, needs eval and treatment, refer to pulmonary

## 2018-02-24 NOTE — Assessment & Plan Note (Signed)
Patient educated about the importance of limiting  Carbohydrate intake , the need to commit to daily physical activity for a minimum of 30 minutes , and to commit weight loss. The fact that changes in all these areas will reduce or eliminate all together the development of diabetes is stressed.   Diabetic Labs Latest Ref Rng & Units 01/24/2017 06/14/2016 12/15/2015 08/10/2015 04/06/2015  HbA1c <5.7 % 6.0(H) 5.9(H) 6.3(H) 6.3(H) 6.0(H)  Chol <200 mg/dL 199 190 - - 194  HDL >50 mg/dL 53 45(L) - - 57  Calc LDL <100 mg/dL 123(H) 113 - - 112(H)  Triglycerides <150 mg/dL 115 160(H) - - 124  Creatinine 0.50 - 1.10 mg/dL 1.11(H) 1.02 0.88 0.97 0.99   BP/Weight 02/18/2018 07/05/2017 06/21/2017 05/07/2017 05/03/2017 01/24/2017 2/54/2706  Systolic BP 237 628 315 176 160 737 106  Diastolic BP 269 96 485 96 97 96 92  Wt. (Lbs) 236 236 234.08 233 215 236 234  BMI 39.27 39.27 37.78 37.61 35.78 38.09 37.79    Updated lab needed .

## 2018-02-24 NOTE — Assessment & Plan Note (Signed)
The increased risk of cardiovascular disease associated with this diagnosis, and the need to consistently work on lifestyle to change this is discussed. Following  a  heart healthy diet ,commitment to 30 minutes of exercise at least 5 days per week, as well as control of blood sugar and cholesterol , and achieving a healthy weight are all the areas to be addressed .  

## 2018-02-24 NOTE — Assessment & Plan Note (Signed)
Uncontrolled, non compliant , re educated re need to change behavior consistently. Medication prescribed and reviewed with patient , safety concerns addressed re meds.Nurse BP check in 1 to 2 weeks, refer to cardiology also as she has CV complaints DASH diet and commitment to daily physical activity for a minimum of 30 minutes discussed and encouraged, as a part of hypertension management. The importance of attaining a healthy weight is also discussed.  BP/Weight 02/18/2018 07/05/2017 06/21/2017 05/07/2017 05/03/2017 01/24/2017 2/91/9166  Systolic BP 060 045 997 741 423 953 202  Diastolic BP 334 96 356 96 97 96 92  Wt. (Lbs) 236 236 234.08 233 215 236 234  BMI 39.27 39.27 37.78 37.61 35.78 38.09 37.79

## 2018-02-24 NOTE — Assessment & Plan Note (Signed)
Unchanged Patient re-educated about  the importance of commitment to a  minimum of 150 minutes of exercise per week.  The importance of healthy food choices with portion control discussed. Encouraged to start a food diary, count calories and to consider  joining a support group. Sample diet sheets offered. Goals set by the patient for the next several months.   Weight /BMI 02/18/2018 07/05/2017 06/21/2017  WEIGHT 236 lb 236 lb 234 lb 1.3 oz  HEIGHT 5\' 5"  5\' 5"  5\' 6"   BMI 39.27 kg/m2 39.27 kg/m2 37.78 kg/m2

## 2018-02-24 NOTE — Assessment & Plan Note (Signed)
2 month h/o palpitations in pt with heart disease needs cardiology eval

## 2018-02-24 NOTE — Assessment & Plan Note (Signed)
2 month h/o increased exertional chest pain and palpitation in pt with multiple CV risk factors, needs cardiology evaluation and she agrees to follow through on this, also has uncontrolled malignant hypertension

## 2018-03-01 ENCOUNTER — Other Ambulatory Visit: Payer: Self-pay | Admitting: Family Medicine

## 2018-03-01 ENCOUNTER — Telehealth: Payer: Self-pay

## 2018-03-01 ENCOUNTER — Ambulatory Visit: Payer: Self-pay

## 2018-03-01 MED ORDER — HYDRALAZINE HCL 100 MG PO TABS: 100.0000 mg | ORAL_TABLET | Freq: Three times a day (TID) | ORAL | 5 refills | Status: DC

## 2018-03-01 NOTE — Telephone Encounter (Signed)
Left message per DPR 

## 2018-03-01 NOTE — Telephone Encounter (Signed)
She needs to increase the hydrallazine to 3 times daily, taken every 8 hours on schedule, example 6am , 2pm, and 10pm. I have sent this dose increase to her pharmacy , please let her know She was referred at her last visit to both cardiology and pulmonary and no appts are seen she nEEDS to schedule and keep both appointments as was discussed, She  Needs to get a sooner a[pointment with me than July scheduled aince her bP is still so high, please schedule   an appt with me for  End May, thanks

## 2018-03-01 NOTE — Telephone Encounter (Signed)
Patient came in for a blood pressure check and it was 200/110 when Tracy Joseph checked it and 170/104 when I checked it. Patient not having any headache or HBP symptoms. States she is under a ton of stress right now with her daughter. I told her I would send you a message to see if you wanted to increase her meds until you can see her in the office or what you recommend. Send the response back to [place]. She is expecting it

## 2018-04-11 ENCOUNTER — Encounter (INDEPENDENT_AMBULATORY_CARE_PROVIDER_SITE_OTHER): Payer: Self-pay | Admitting: *Deleted

## 2018-04-27 ENCOUNTER — Other Ambulatory Visit: Payer: Self-pay | Admitting: Family Medicine

## 2018-05-07 ENCOUNTER — Ambulatory Visit: Payer: BLUE CROSS/BLUE SHIELD | Admitting: Endocrinology

## 2018-07-01 ENCOUNTER — Ambulatory Visit: Payer: Self-pay | Admitting: Family Medicine

## 2018-07-02 ENCOUNTER — Encounter: Payer: Self-pay | Admitting: Family Medicine

## 2018-07-30 ENCOUNTER — Other Ambulatory Visit: Payer: Self-pay | Admitting: Endocrinology

## 2019-02-23 ENCOUNTER — Other Ambulatory Visit: Payer: Self-pay | Admitting: Family Medicine

## 2019-04-23 ENCOUNTER — Other Ambulatory Visit: Payer: Self-pay | Admitting: Family Medicine

## 2019-04-29 ENCOUNTER — Telehealth: Payer: Self-pay

## 2019-04-29 ENCOUNTER — Ambulatory Visit: Payer: Self-pay | Admitting: Family Medicine

## 2019-04-29 DIAGNOSIS — E559 Vitamin D deficiency, unspecified: Secondary | ICD-10-CM

## 2019-04-29 DIAGNOSIS — R7302 Impaired glucose tolerance (oral): Secondary | ICD-10-CM

## 2019-04-29 DIAGNOSIS — E890 Postprocedural hypothyroidism: Secondary | ICD-10-CM

## 2019-04-29 DIAGNOSIS — I10 Essential (primary) hypertension: Secondary | ICD-10-CM

## 2019-04-29 NOTE — Telephone Encounter (Signed)
Labs ordered.

## 2019-04-30 ENCOUNTER — Other Ambulatory Visit: Payer: Self-pay

## 2019-04-30 LAB — CBC
HCT: 37.3 % (ref 35.0–45.0)
Hemoglobin: 12.7 g/dL (ref 11.7–15.5)
MCH: 28 pg (ref 27.0–33.0)
MCHC: 34 g/dL (ref 32.0–36.0)
MCV: 82.3 fL (ref 80.0–100.0)
MPV: 11.1 fL (ref 7.5–12.5)
Platelets: 337 10*3/uL (ref 140–400)
RBC: 4.53 10*6/uL (ref 3.80–5.10)
RDW: 14.6 % (ref 11.0–15.0)
WBC: 10 10*3/uL (ref 3.8–10.8)

## 2019-04-30 LAB — COMPLETE METABOLIC PANEL WITH GFR
AG Ratio: 1.2 (calc) (ref 1.0–2.5)
ALT: 9 U/L (ref 6–29)
AST: 15 U/L (ref 10–35)
Albumin: 4.3 g/dL (ref 3.6–5.1)
Alkaline phosphatase (APISO): 98 U/L (ref 31–125)
BUN: 10 mg/dL (ref 7–25)
CO2: 29 mmol/L (ref 20–32)
Calcium: 9.6 mg/dL (ref 8.6–10.2)
Chloride: 101 mmol/L (ref 98–110)
Creat: 1.08 mg/dL (ref 0.50–1.10)
GFR, Est African American: 70 mL/min/{1.73_m2} (ref >=60)
GFR, Est Non African American: 60 mL/min/{1.73_m2} (ref >=60)
Globulin: 3.7 g/dL (calc) (ref 1.9–3.7)
Glucose, Bld: 87 mg/dL (ref 65–139)
Potassium: 3.8 mmol/L (ref 3.5–5.3)
Sodium: 137 mmol/L (ref 135–146)
Total Bilirubin: 0.4 mg/dL (ref 0.2–1.2)
Total Protein: 8 g/dL (ref 6.1–8.1)

## 2019-04-30 LAB — LIPID PANEL
Cholesterol: 208 mg/dL — ABNORMAL HIGH (ref <200)
HDL: 45 mg/dL — ABNORMAL LOW (ref >=50)
LDL Cholesterol (Calc): 133 mg/dL (calc) — ABNORMAL HIGH
Non-HDL Cholesterol (Calc): 163 mg/dL (calc) — ABNORMAL HIGH (ref <130)
Total CHOL/HDL Ratio: 4.6 (calc) (ref <5.0)
Triglycerides: 167 mg/dL — ABNORMAL HIGH (ref <150)

## 2019-04-30 LAB — TSH: TSH: 5.99 mIU/L — ABNORMAL HIGH

## 2019-04-30 LAB — HEMOGLOBIN A1C
Hgb A1c MFr Bld: 7 % of total Hgb — ABNORMAL HIGH (ref <5.7)
Mean Plasma Glucose: 154 (calc)
eAG (mmol/L): 8.5 (calc)

## 2019-04-30 LAB — VITAMIN D 25 HYDROXY (VIT D DEFICIENCY, FRACTURES): Vit D, 25-Hydroxy: 22 ng/mL — ABNORMAL LOW (ref 30–100)

## 2019-04-30 MED ORDER — HYDRALAZINE HCL 100 MG PO TABS: 100.0000 mg | ORAL_TABLET | Freq: Three times a day (TID) | ORAL | 0 refills | Status: DC

## 2019-04-30 MED ORDER — TRIAMTERENE-HCTZ 37.5-25 MG PO TABS: ORAL_TABLET | ORAL | 0 refills | Status: DC

## 2019-04-30 MED ORDER — AMLODIPINE BESYLATE 10 MG PO TABS: ORAL_TABLET | ORAL | 0 refills | Status: DC

## 2019-05-05 ENCOUNTER — Encounter: Payer: Self-pay | Admitting: Family Medicine

## 2019-05-05 ENCOUNTER — Other Ambulatory Visit: Payer: Self-pay

## 2019-05-05 ENCOUNTER — Ambulatory Visit (INDEPENDENT_AMBULATORY_CARE_PROVIDER_SITE_OTHER): Payer: Self-pay | Admitting: Family Medicine

## 2019-05-05 VITALS — BP 160/100 | HR 82 | Resp 15 | Ht 65.0 in | Wt 235.0 lb

## 2019-05-05 DIAGNOSIS — I10 Essential (primary) hypertension: Secondary | ICD-10-CM

## 2019-05-05 DIAGNOSIS — E1159 Type 2 diabetes mellitus with other circulatory complications: Secondary | ICD-10-CM

## 2019-05-05 DIAGNOSIS — R7303 Prediabetes: Secondary | ICD-10-CM | POA: Insufficient documentation

## 2019-05-05 DIAGNOSIS — E559 Vitamin D deficiency, unspecified: Secondary | ICD-10-CM

## 2019-05-05 DIAGNOSIS — Z1231 Encounter for screening mammogram for malignant neoplasm of breast: Secondary | ICD-10-CM

## 2019-05-05 DIAGNOSIS — E785 Hyperlipidemia, unspecified: Secondary | ICD-10-CM

## 2019-05-05 DIAGNOSIS — E8881 Metabolic syndrome: Secondary | ICD-10-CM

## 2019-05-05 DIAGNOSIS — E890 Postprocedural hypothyroidism: Secondary | ICD-10-CM

## 2019-05-05 MED ORDER — HYDRALAZINE HCL 100 MG PO TABS: 100.0000 mg | ORAL_TABLET | Freq: Two times a day (BID) | ORAL | 1 refills | Status: DC

## 2019-05-05 MED ORDER — ATORVASTATIN CALCIUM 10 MG PO TABS: 10.0000 mg | ORAL_TABLET | Freq: Every day | ORAL | 1 refills | Status: DC

## 2019-05-05 MED ORDER — LEVOTHYROXINE SODIUM 175 MCG PO TABS: ORAL_TABLET | ORAL | 1 refills | Status: DC

## 2019-05-05 MED ORDER — METFORMIN HCL ER (OSM) 500 MG PO TB24: 500.0000 mg | ORAL_TABLET | Freq: Every day | ORAL | 1 refills | Status: DC

## 2019-05-05 MED ORDER — TRIAMTERENE-HCTZ 75-50 MG PO TABS: 1.0000 | ORAL_TABLET | Freq: Every day | ORAL | 1 refills | Status: DC

## 2019-05-05 NOTE — Assessment & Plan Note (Signed)
Under corrected increase to 1.5 tabs every Mon, Wed and Friday

## 2019-05-05 NOTE — Assessment & Plan Note (Signed)
Start once daily metformin Tracy Joseph is reminded of the importance of commitment to daily physical activity for 30 minutes or more, as able and the need to limit carbohydrate intake to 30 to 60 grams per meal to help with blood sugar control.   The need to take medication as prescribed, test blood sugar as directed, and to call between visits if there is a concern that blood sugar is uncontrolled is also discussed.   Tracy Joseph is reminded of the importance of daily foot exam, annual eye examination, and good blood sugar, blood pressure and cholesterol control.  Diabetic Labs Latest Ref Rng & Units 04/29/2019 01/24/2017 06/14/2016 12/15/2015 08/10/2015  HbA1c <5.7 % of total Hgb 7.0(H) 6.0(H) 5.9(H) 6.3(H) 6.3(H)  Chol <200 mg/dL 208(H) 199 190 - -  HDL > OR = 50 mg/dL 45(L) 53 45(L) - -  Calc LDL mg/dL (calc) 133(H) 123(H) 113 - -  Triglycerides <150 mg/dL 167(H) 115 160(H) - -  Creatinine 0.50 - 1.10 mg/dL 1.08 1.11(H) 1.02 0.88 0.97   BP/Weight 05/05/2019 02/18/2018 07/05/2017 06/21/2017 05/07/2017 04/01/8308 4/0/7680  Systolic BP 881 103 159 458 592 924 462  Diastolic BP 863 817 96 711 96 97 96  Wt. (Lbs) 235 236 236 234.08 233 215 236  BMI 39.11 39.27 39.27 37.78 37.61 35.78 38.09   No flowsheet data found.

## 2019-05-05 NOTE — Assessment & Plan Note (Signed)
Obesity linked with hypertension and diabetes unchanged  Patient re-educated about  the importance of commitment to a  minimum of 150 minutes of exercise per week as able.  The importance of healthy food choices with portion control discussed, as well as eating regularly and within a 12 hour window most days. The need to choose "clean , green" food 50 to 75% of the time is discussed, as well as to make water the primary drink and set a goal of 64 ounces water daily.  Encouraged to start a food diary,  and to consider  joining a support group. Sample diet sheets offered. Goals set by the patient for the next several months.   Weight /BMI 05/05/2019 02/18/2018 07/05/2017  WEIGHT 235 lb 236 lb 236 lb  HEIGHT 5\' 5"  5\' 5"  5\' 5"   BMI 39.11 kg/m2 39.27 kg/m2 39.27 kg/m2

## 2019-05-05 NOTE — Patient Instructions (Addendum)
Physical with pap in 6 weeks, call if you need me sooner.  Please take all medication as prescribed, in particulat your blood pressure meds  Increase thyroid medication take 1.5 tablets every Mon day, Wednesday and Friday  Mammogram will be scheduled  It is important that you exercise regularly at least 30 minutes 5 times a week. If you develop chest pain, have severe difficulty breathing, or feel very tired, stop exercising immediately and seek medical attention  Thanks for choosing [facility], we consider it a privelige to serve you.

## 2019-05-05 NOTE — Progress Notes (Signed)
EDITHE DOBBIN     MRN: [id number]      DOB: 1970-12-27   HPI Ms. Dilger is here for follow up and re-evaluation of chronic medical conditions, medication management and review of any available recent lab and radiology data.  Preventive health is updated, specifically  Cancer screening and Immunization.   Questions or concerns regarding consultations or procedures which the PT has had in the interim are  addressed. The PT denies any adverse reactions to current medications since the last visit.  There are no new concerns.  There are no specific complaints   ROS Denies recent fever or chills. Denies sinus pressure, nasal congestion, ear pain or sore throat. Denies chest congestion, productive cough or wheezing. Denies chest pains, palpitations and leg swelling Denies abdominal pain, nausea, vomiting,diarrhea or constipation.   Denies dysuria, frequency, hesitancy or incontinence. Denies joint pain, swelling and limitation in mobility. Denies headaches, seizures, numbness, or tingling. Denies depression, anxiety or insomnia. Denies skin break down or rash. Sttaes she has been taking bP meds as prescribed, but on review, she has been missing one dose of her hydrallazine  PE  BP (!) 160/100   Pulse 82   Resp 15   Ht 5\' 5"  (1.651 m)   Wt 235 lb (106.6 kg)   SpO2 99%   BMI 39.11 kg/m   Patient alert and oriented and in no cardiopulmonary distress.  HEENT: No facial asymmetry, EOMI,   oropharynx pink and moist.  Neck supple no JVD, no mass.  Chest: Clear to auscultation bilaterally.  CVS: S1, S2 no murmurs, no S3.Regular rate.  ABD: Soft non tender.   Ext: No edema  MS: Adequate ROM spine, shoulders, hips and knees.  Skin: Intact, no ulcerations or rash noted.  Psych: Good eye contact, normal affect. Memory intact not anxious or depressed appearing.  CNS: CN 2-12 intact, power,  normal throughout.no focal deficits noted.   Assessment & Plan  Essential  hypertension, malignant Uncontrolled, re  Educated as to dosing frequency as she has not been takin g as precribed and BP is high DASH diet and commitment to daily physical activity for a minimum of 30 minutes discussed and encouraged, as a part of hypertension management. The importance of attaining a healthy weight is also discussed.  BP/Weight 05/05/2019 02/18/2018 07/05/2017 06/21/2017 05/07/2017 03/08/253 01/24/622  Systolic BP 762 831 517 616 073 710 626  Diastolic BP 948 546 96 270 96 97 96  Wt. (Lbs) 235 236 236 234.08 233 215 236  BMI 39.11 39.27 39.27 37.78 37.61 35.78 38.09   F/U in 6 week    IGT (impaired glucose tolerance) Deteriorated   Type 2 diabetes mellitus with vascular disease ([place]) Start once daily metformin Ms. Souter is reminded of the importance of commitment to daily physical activity for 30 minutes or more, as able and the need to limit carbohydrate intake to 30 to 60 grams per meal to help with blood sugar control.   The need to take medication as prescribed, test blood sugar as directed, and to call between visits if there is a concern that blood sugar is uncontrolled is also discussed.   Ms. Nanney is reminded of the importance of daily foot exam, annual eye examination, and good blood sugar, blood pressure and cholesterol control.  Diabetic Labs Latest Ref Rng & Units 04/29/2019 01/24/2017 06/14/2016 12/15/2015 08/10/2015  HbA1c <5.7 % of total Hgb 7.0(H) 6.0(H) 5.9(H) 6.3(H) 6.3(H)  Chol <200 mg/dL 208(H) 199 190 - -  HDL > OR = 50 mg/dL 45(L) 53 45(L) - -  Calc LDL mg/dL (calc) 133(H) 123(H) 113 - -  Triglycerides <150 mg/dL 167(H) 115 160(H) - -  Creatinine 0.50 - 1.10 mg/dL 1.08 1.11(H) 1.02 0.88 0.97   BP/Weight 05/05/2019 02/18/2018 07/05/2017 06/21/2017 05/07/2017 6/75/9163 07/21/6658  Systolic BP 935 701 779 390 300 923 300  Diastolic BP 762 263 96 335 96 97 96  Wt. (Lbs) 235 236 236 234.08 233 215 236  BMI 39.11 39.27 39.27 37.78 37.61 35.78 38.09   No  flowsheet data found.      Morbid obesity ([place]) Obesity linked with hypertension and diabetes unchanged  Patient re-educated about  the importance of commitment to a  minimum of 150 minutes of exercise per week as able.  The importance of healthy food choices with portion control discussed, as well as eating regularly and within a 12 hour window most days. The need to choose "clean , green" food 50 to 75% of the time is discussed, as well as to make water the primary drink and set a goal of 64 ounces water daily.  Encouraged to start a food diary,  and to consider  joining a support group. Sample diet sheets offered. Goals set by the patient for the next several months.   Weight /BMI 05/05/2019 02/18/2018 07/05/2017  WEIGHT 235 lb 236 lb 236 lb  HEIGHT 5\' 5"  5\' 5"  5\' 5"   BMI 39.11 kg/m2 39.27 kg/m2 39.27 kg/m2      Hyperlipidemia LDL goal <100 Not at goal Hyperlipidemia:Low fat diet discussed and encouraged.   Lipid Panel  Lab Results  Component Value Date   CHOL 208 (H) 04/29/2019   HDL 45 (L) 04/29/2019   LDLCALC 133 (H) 04/29/2019   TRIG 167 (H) 04/29/2019   CHOLHDL 4.6 04/29/2019     Start daily statin for CV protection  Vitamin D deficiency Very low start weekly supplement  Metabolic syndrome X The increased risk of cardiovascular disease associated with this diagnosis, and the need to consistently work on lifestyle to change this is discussed. Following  a  heart healthy diet ,commitment to 30 minutes of exercise at least 5 days per week, as well as control of blood sugar and cholesterol , and achieving a healthy weight are all the areas to be addressed .   Hypothyroidism following radioiodine therapy Under corrected increase to 1.5 tabs every Mon, Wed and Friday

## 2019-05-05 NOTE — Assessment & Plan Note (Signed)
Not at goal Hyperlipidemia:Low fat diet discussed and encouraged.   Lipid Panel  Lab Results  Component Value Date   CHOL 208 (H) 04/29/2019   HDL 45 (L) 04/29/2019   LDLCALC 133 (H) 04/29/2019   TRIG 167 (H) 04/29/2019   CHOLHDL 4.6 04/29/2019     Start daily statin for CV protection

## 2019-05-05 NOTE — Assessment & Plan Note (Signed)
Deteriorated.  

## 2019-05-05 NOTE — Assessment & Plan Note (Signed)
Very low start weekly supplement

## 2019-05-05 NOTE — Assessment & Plan Note (Signed)
The increased risk of cardiovascular disease associated with this diagnosis, and the need to consistently work on lifestyle to change this is discussed. Following  a  heart healthy diet ,commitment to 30 minutes of exercise at least 5 days per week, as well as control of blood sugar and cholesterol , and achieving a healthy weight are all the areas to be addressed .  

## 2019-05-05 NOTE — Assessment & Plan Note (Signed)
Uncontrolled, re  Educated as to dosing frequency as she has not been takin g as precribed and BP is high DASH diet and commitment to daily physical activity for a minimum of 30 minutes discussed and encouraged, as a part of hypertension management. The importance of attaining a healthy weight is also discussed.  BP/Weight 05/05/2019 02/18/2018 07/05/2017 06/21/2017 05/07/2017 3/35/8251 07/26/8420  Systolic BP 031 281 188 677 373 668 159  Diastolic BP 470 761 96 518 96 97 96  Wt. (Lbs) 235 236 236 234.08 233 215 236  BMI 39.11 39.27 39.27 37.78 37.61 35.78 38.09   F/U in 6 week

## 2019-05-23 ENCOUNTER — Telehealth: Payer: Self-pay | Admitting: Family Medicine

## 2019-05-23 ENCOUNTER — Other Ambulatory Visit: Payer: Self-pay | Admitting: Family Medicine

## 2019-05-23 NOTE — Telephone Encounter (Signed)
Noted , I will remove the mediucations she states she will not take and re evaluate at her visit esp the blood pressure

## 2019-05-23 NOTE — Telephone Encounter (Signed)
Pt LVM that she will not be taking the following RX  atorvastatin(LIPITOR)   metformin(FORTAMET)   triamterene-hydrochlorothiazide(MAXZIDE)   That was prescribed on 5-18 after further research. Amlodepine also makes her swell, you can call if you like

## 2019-05-23 NOTE — Telephone Encounter (Signed)
FYI: spoke with patient and she stated that she has never had a problem with cholesterol before. It's coming from all the eating and working from home. The metformin messes with too many other things. She isn't going to take the atorvastatin and triamterene/hctz because it's just adding too many pills. Amlodipine causes her to swell.

## 2019-05-25 ENCOUNTER — Other Ambulatory Visit: Payer: Self-pay | Admitting: Family Medicine

## 2019-05-29 ENCOUNTER — Other Ambulatory Visit: Payer: Self-pay

## 2019-05-29 ENCOUNTER — Other Ambulatory Visit (HOSPITAL_COMMUNITY): Payer: Self-pay | Admitting: Family Medicine

## 2019-05-29 ENCOUNTER — Ambulatory Visit (HOSPITAL_COMMUNITY)
Admission: RE | Admit: 2019-05-29 | Discharge: 2019-05-29 | Disposition: A | Discharge status: Home / Self Care | Payer: No Typology Code available for payment source | Source: Ambulatory Visit | Attending: Family Medicine | Admitting: Family Medicine

## 2019-05-29 ENCOUNTER — Ambulatory Visit (HOSPITAL_COMMUNITY): Payer: Self-pay

## 2019-05-29 DIAGNOSIS — Z1231 Encounter for screening mammogram for malignant neoplasm of breast: Secondary | ICD-10-CM | POA: Insufficient documentation

## 2019-05-29 DIAGNOSIS — R928 Other abnormal and inconclusive findings on diagnostic imaging of breast: Secondary | ICD-10-CM

## 2019-06-03 ENCOUNTER — Other Ambulatory Visit: Payer: Self-pay

## 2019-06-03 ENCOUNTER — Encounter (HOSPITAL_COMMUNITY): Payer: Self-pay

## 2019-06-03 ENCOUNTER — Ambulatory Visit (HOSPITAL_COMMUNITY)
Admission: RE | Admit: 2019-06-03 | Discharge: 2019-06-03 | Disposition: A | Discharge status: Home / Self Care | Payer: No Typology Code available for payment source | Source: Ambulatory Visit | Attending: Family Medicine | Admitting: Family Medicine

## 2019-06-03 ENCOUNTER — Ambulatory Visit (HOSPITAL_COMMUNITY): Payer: No Typology Code available for payment source

## 2019-06-03 DIAGNOSIS — R928 Other abnormal and inconclusive findings on diagnostic imaging of breast: Secondary | ICD-10-CM | POA: Insufficient documentation

## 2019-06-17 ENCOUNTER — Ambulatory Visit (HOSPITAL_COMMUNITY)
Admission: RE | Admit: 2019-06-17 | Discharge: 2019-06-17 | Disposition: A | Discharge status: Home / Self Care | Payer: No Typology Code available for payment source | Source: Ambulatory Visit | Attending: Family Medicine | Admitting: Family Medicine

## 2019-06-17 ENCOUNTER — Encounter (HOSPITAL_COMMUNITY): Payer: Self-pay

## 2019-06-17 ENCOUNTER — Ambulatory Visit (HOSPITAL_COMMUNITY): Admission: RE | Admit: 2019-06-17 | Payer: No Typology Code available for payment source | Source: Ambulatory Visit

## 2019-06-17 ENCOUNTER — Other Ambulatory Visit: Payer: Self-pay

## 2019-06-17 DIAGNOSIS — R928 Other abnormal and inconclusive findings on diagnostic imaging of breast: Secondary | ICD-10-CM | POA: Insufficient documentation

## 2019-06-25 ENCOUNTER — Encounter: Payer: Self-pay | Admitting: Family Medicine

## 2019-07-08 ENCOUNTER — Other Ambulatory Visit (HOSPITAL_COMMUNITY)
Admission: RE | Admit: 2019-07-08 | Discharge: 2019-07-08 | Disposition: A | Discharge status: Home / Self Care | Payer: No Typology Code available for payment source | Source: Ambulatory Visit | Attending: Family Medicine | Admitting: Family Medicine

## 2019-07-08 ENCOUNTER — Ambulatory Visit (INDEPENDENT_AMBULATORY_CARE_PROVIDER_SITE_OTHER): Payer: No Typology Code available for payment source | Admitting: Family Medicine

## 2019-07-08 ENCOUNTER — Other Ambulatory Visit: Payer: Self-pay

## 2019-07-08 ENCOUNTER — Encounter: Payer: Self-pay | Admitting: Family Medicine

## 2019-07-08 VITALS — BP 180/110 | HR 75 | Temp 98.0°F | Resp 15 | Ht 65.0 in | Wt 239.0 lb

## 2019-07-08 DIAGNOSIS — Z0000 Encounter for general adult medical examination without abnormal findings: Secondary | ICD-10-CM | POA: Diagnosis not present

## 2019-07-08 DIAGNOSIS — Z124 Encounter for screening for malignant neoplasm of cervix: Secondary | ICD-10-CM | POA: Insufficient documentation

## 2019-07-08 DIAGNOSIS — I10 Essential (primary) hypertension: Secondary | ICD-10-CM | POA: Diagnosis not present

## 2019-07-08 DIAGNOSIS — E785 Hyperlipidemia, unspecified: Secondary | ICD-10-CM

## 2019-07-08 DIAGNOSIS — E8881 Metabolic syndrome: Secondary | ICD-10-CM

## 2019-07-08 MED ORDER — TRIAMTERENE-HCTZ 37.5-25 MG PO TABS: 1.0000 | ORAL_TABLET | Freq: Every day | ORAL | 4 refills | Status: DC

## 2019-07-08 NOTE — Progress Notes (Signed)
Tracy Joseph     MRN: [id number]      DOB: [birth year]  HPI: Patient is in for annual physical exam. Uncontrolled hypertension is addressed at the visit. Recent labs, if available are reviewed. Immunization is reviewed , however declines recommended immunization at this time   PE: BP (!) 180/110   Pulse 75   Temp 98 F (36.7 C)   Resp 15   Ht 5\' 5"  (1.651 m)   Wt 239 lb (108.4 kg)   SpO2 97%   BMI 39.77 kg/m   Pleasant  female, alert and oriented x 3, in no cardio-pulmonary distress. Afebrile. HEENT No facial trauma or asymetry. Sinuses non tender.  Extra occullar muscles intact, External ears normal, Neck: supple, no adenopathy,JVD or thyromegaly.No bruits.  Chest: Clear to ascultation bilaterally.No crackles or wheezes. Non tender to palpation  Breast: No asymetry,no masses or lumps. No tenderness. No nipple discharge or inversion. No axillary or supraclavicular adenopathy  Cardiovascular system; Heart sounds normal,  S1 and  S2 ,no S3.  No murmur, or thrill. Apical beat not displaced Peripheral pulses normal.  Abdomen: Soft, non tender, no organomegaly or masses. No bruits. Bowel sounds normal. No guarding, tenderness or rebound.  .  GU: External genitalia normal female genitalia , normal female distribution of hair. No lesions. Urethral meatus normal in size, no  Prolapse, no lesions visibly  Present. Bladder non tender. Vagina pink and moist , with no visible lesions , discharge present . Adequate pelvic support no  cystocele or rectocele noted Cervix pink and appears healthy, no lesions or ulcerations noted, no discharge noted from os Uterus normal size, no adnexal masses, no cervical motion or adnexal tenderness.   Musculoskeletal exam: Full ROM of spine, hips , shoulders and knees. No deformity ,swelling or crepitus noted. No muscle wasting or atrophy.   Neurologic: Cranial nerves 2 to 12 intact. Power, tone ,sensation and reflexes normal  throughout. No disturbance in gait. No tremor.  Skin: Intact, no ulceration, erythema , scaling or rash noted. Pigmentation normal throughout  Psych; Normal mood and affect. Judgement and concentration normal   Assessment & Plan:  Annual physical exam Annual exam as documented. Counseling done  re healthy lifestyle involving commitment to 150 minutes exercise per week, heart healthy diet, and attaining healthy weight.The importance of adequate sleep also discussed. Regular seat belt use and home safety, is also discussed. Changes in health habits are decided on by the patient with goals and time frames  set for achieving them. Immunization and cancer screening needs are specifically addressed at this visit.   Essential hypertension, malignant Uncontrolled , add triamteren  DASH diet and commitment to daily physical activity for a minimum of 30 minutes discussed and encouraged, as a part of hypertension management. The importance of attaining a healthy weight is also discussed.  BP/Weight 07/08/2019 05/05/2019 02/18/2018 07/05/2017 06/21/2017 05/07/2017 1/69/4503  Systolic BP 888 280 034 917 915 056 979  Diastolic BP 480 165 537 96 120 96 97  Wt. (Lbs) 239 235 236 236 234.08 233 215  BMI 39.77 39.11 39.27 39.27 37.78 37.61 35.78       Morbid obesity ([facility])  Patient re-educated about  the importance of commitment to a  minimum of 150 minutes of exercise per week as able.  The importance of healthy food choices with portion control discussed, as well as eating regularly and within a 12 hour window most days. The need to choose "clean , green" food 50 to  75% of the time is discussed, as well as to make water the primary drink and set a goal of 64 ounces water daily.    Weight /BMI 07/08/2019 05/05/2019 02/18/2018  WEIGHT 239 lb 235 lb 236 lb  HEIGHT 5\' 5"  5\' 5"  5\' 5"   BMI 39.77 kg/m2 39.11 kg/m2 39.27 kg/m2

## 2019-07-08 NOTE — Patient Instructions (Addendum)
F/up with mD first week in September , call if you need me before  Please get fasting lipid, cmp and eGFr and HBA1C last week in August  New additional medication for blood pressure is triamterene one daily, continure hydralazine two times daily  Think about what you will eat, plan ahead. Choose " clean, green, fresh or frozen" over canned, processed or packaged foods which are more sugary, salty and fatty. 70 to 75% of food eaten should be vegetables and fruit. Three meals at set times with snacks allowed between meals, but they must be fruit or vegetables. Aim to eat over a 12 hour period , example 7 am to 7 pm, and STOP after  your last meal of the day. Drink water,generally about 64 ounces per day, no other drink is as healthy. Fruit juice is best enjoyed in a healthy way, by EATING the fruit.  It is important that you exercise regularly at least 30 minutes 5 times a week. If you develop chest pain, have severe difficulty breathing, or feel very tired, stop exercising immediately and seek medical attention  Thanks for choosing [facility], we consider it a privelige to serve you.

## 2019-07-10 ENCOUNTER — Encounter: Payer: Self-pay | Admitting: *Deleted

## 2019-07-12 ENCOUNTER — Encounter: Payer: Self-pay | Admitting: Family Medicine

## 2019-07-12 LAB — CYTOLOGY - PAP
Diagnosis: NEGATIVE
HPV: NOT DETECTED

## 2019-07-12 NOTE — Assessment & Plan Note (Signed)
  Patient re-educated about  the importance of commitment to a  minimum of 150 minutes of exercise per week as able.  The importance of healthy food choices with portion control discussed, as well as eating regularly and within a 12 hour window most days. The need to choose "clean , green" food 50 to 75% of the time is discussed, as well as to make water the primary drink and set a goal of 64 ounces water daily.    Weight /BMI 07/08/2019 05/05/2019 02/18/2018  WEIGHT 239 lb 235 lb 236 lb  HEIGHT 5\' 5"  5\' 5"  5\' 5"   BMI 39.77 kg/m2 39.11 kg/m2 39.27 kg/m2

## 2019-07-12 NOTE — Assessment & Plan Note (Signed)
Uncontrolled , add triamteren  DASH diet and commitment to daily physical activity for a minimum of 30 minutes discussed and encouraged, as a part of hypertension management. The importance of attaining a healthy weight is also discussed.  BP/Weight 07/08/2019 05/05/2019 02/18/2018 07/05/2017 06/21/2017 05/07/2017 4/37/3578  Systolic BP 978 478 412 820 813 887 195  Diastolic BP 974 718 550 96 120 96 97  Wt. (Lbs) 239 235 236 236 234.08 233 215  BMI 39.77 39.11 39.27 39.27 37.78 37.61 35.78

## 2019-07-12 NOTE — Assessment & Plan Note (Signed)

## 2019-07-31 ENCOUNTER — Other Ambulatory Visit: Payer: Self-pay | Admitting: Internal Medicine

## 2019-07-31 DIAGNOSIS — Z20822 Contact with and (suspected) exposure to covid-19: Secondary | ICD-10-CM

## 2019-08-02 LAB — NOVEL CORONAVIRUS, NAA: SARS-CoV-2, NAA: NOT DETECTED

## 2019-08-21 ENCOUNTER — Ambulatory Visit: Payer: No Typology Code available for payment source | Admitting: Family Medicine

## 2019-08-26 ENCOUNTER — Other Ambulatory Visit: Payer: Self-pay

## 2019-08-26 ENCOUNTER — Ambulatory Visit (INDEPENDENT_AMBULATORY_CARE_PROVIDER_SITE_OTHER): Payer: Self-pay | Admitting: Family Medicine

## 2019-08-26 VITALS — BP 180/110 | Ht 65.0 in | Wt 239.0 lb

## 2019-08-26 DIAGNOSIS — I10 Essential (primary) hypertension: Secondary | ICD-10-CM

## 2019-08-26 DIAGNOSIS — E1159 Type 2 diabetes mellitus with other circulatory complications: Secondary | ICD-10-CM

## 2019-08-27 ENCOUNTER — Ambulatory Visit: Payer: Self-pay | Admitting: Family Medicine

## 2019-08-27 NOTE — Progress Notes (Signed)
Visit rescheduled to in office with labs drawn prior to visit for review in office

## 2019-09-04 ENCOUNTER — Ambulatory Visit: Payer: Self-pay | Admitting: Family Medicine

## 2019-09-16 ENCOUNTER — Other Ambulatory Visit: Payer: Self-pay

## 2019-09-16 ENCOUNTER — Ambulatory Visit (INDEPENDENT_AMBULATORY_CARE_PROVIDER_SITE_OTHER): Payer: Self-pay | Admitting: Family Medicine

## 2019-09-16 ENCOUNTER — Encounter: Payer: Self-pay | Admitting: Family Medicine

## 2019-09-16 VITALS — BP 180/112 | HR 91 | Temp 98.1°F | Resp 15 | Ht 65.0 in | Wt 236.0 lb

## 2019-09-16 DIAGNOSIS — E1159 Type 2 diabetes mellitus with other circulatory complications: Secondary | ICD-10-CM

## 2019-09-16 DIAGNOSIS — E785 Hyperlipidemia, unspecified: Secondary | ICD-10-CM

## 2019-09-16 DIAGNOSIS — G4733 Obstructive sleep apnea (adult) (pediatric): Secondary | ICD-10-CM

## 2019-09-16 DIAGNOSIS — I10 Essential (primary) hypertension: Secondary | ICD-10-CM

## 2019-09-16 MED ORDER — TRIAMTERENE-HCTZ 75-50 MG PO TABS: 1.0000 | ORAL_TABLET | Freq: Every day | ORAL | 3 refills | Status: DC

## 2019-09-16 MED ORDER — LABETALOL HCL 200 MG PO TABS: 200.0000 mg | ORAL_TABLET | Freq: Two times a day (BID) | ORAL | 1 refills | Status: DC

## 2019-09-16 NOTE — Assessment & Plan Note (Signed)
Hyperlipidemia:Low fat diet discussed and encouraged.   Lipid Panel  Lab Results  Component Value Date   CHOL 208 (H) 04/29/2019   HDL 45 (L) 04/29/2019   LDLCALC 133 (H) 04/29/2019   TRIG 167 (H) 04/29/2019   CHOLHDL 4.6 04/29/2019  Updated lab needed at/ before next visit. Non compliant with statin despite education re clear indication and benefit as a diabetic

## 2019-09-16 NOTE — Assessment & Plan Note (Signed)
Updated lab needed at/ before next visit. Pt chose not to take medication prescribed

## 2019-09-16 NOTE — Assessment & Plan Note (Signed)
Obesity linked wit hypertension and diabetes  Patient re-educated about  the importance of commitment to a  minimum of 150 minutes of exercise per week as able.  The importance of healthy food choices with portion control discussed, as well as eating regularly and within a 12 hour window most days. The need to choose "clean , green" food 50 to 75% of the time is discussed, as well as to make water the primary drink and set a goal of 64 ounces water daily.    Weight /BMI 09/16/2019 08/26/2019 07/08/2019  WEIGHT 236 lb 239 lb 239 lb  HEIGHT 5\' 5"  5\' 5"  5\' 5"   BMI 39.27 kg/m2 39.77 kg/m2 39.77 kg/m2

## 2019-09-16 NOTE — Assessment & Plan Note (Signed)
Uncontrolled, no improvement, non compliant with medication Extensive education re need for adequate treatment to protect brain, kidney and heart DASH diet and commitment to daily physical activity for a minimum of 30 minutes discussed and encouraged, as a part of hypertension management. The importance of attaining a healthy weight is also discussed.  BP/Weight 09/16/2019 08/26/2019 07/08/2019 05/05/2019 02/18/2018 0000000 0000000  Systolic BP 99991111 99991111 [id number] 0000000 [id number] 0000000  Diastolic BP [id number] 108 96 120  Wt. (Lbs) 236 239 239 235 236 236 234.08  BMI 39.27 39.77 39.77 39.11 39.27 39.27 37.78     Increase in triamteren dose and start labetalol, f/ in 1 week

## 2019-09-16 NOTE — Patient Instructions (Addendum)
F/U in 1 week in office with mD, re eval blood pressure  New regime Maxzid increased dose 75/50  ONE every morning 10 am  Hydralazine need to take at 10 am and 10 pm  New Labetalol, need to take at 10 am and 10 pm   Hypertension, Adult Hypertension is another name for high blood pressure. High blood pressure forces your heart to work harder to pump blood. This can cause problems over time. There are two numbers in a blood pressure reading. There is a top number (systolic) over a bottom number (diastolic). It is best to have a blood pressure that is below 120/80. Healthy choices can help lower your blood pressure, or you may need medicine to help lower it. What are the causes? The cause of this condition is not known. Some conditions may be related to high blood pressure. What increases the risk?  Smoking.  Having type 2 diabetes mellitus, high cholesterol, or both.  Not getting enough exercise or physical activity.  Being overweight.  Having too much fat, sugar, calories, or salt (sodium) in your diet.  Drinking too much alcohol.  Having long-term (chronic) kidney disease.  Having a family history of high blood pressure.  Age. Risk increases with age.  Race. You may be at higher risk if you are African American.  Gender. Men are at higher risk than women before age 47. After age 54, women are at higher risk than men.  Having obstructive sleep apnea.  Stress. What are the signs or symptoms?  High blood pressure may not cause symptoms. Very high blood pressure (hypertensive crisis) may cause: ? Headache. ? Feelings of worry or nervousness (anxiety). ? Shortness of breath. ? Nosebleed. ? A feeling of being sick to your stomach (nausea). ? Throwing up (vomiting). ? Changes in how you see. ? Very bad chest pain. ? Seizures. How is this treated?  This condition is treated by making healthy lifestyle changes, such as: ? Eating healthy foods. ? Exercising  more. ? Drinking less alcohol.  Your health care provider may prescribe medicine if lifestyle changes are not enough to get your blood pressure under control, and if: ? Your top number is above 130. ? Your bottom number is above 80.  Your personal target blood pressure may vary. Follow these instructions at home: Eating and drinking   If told, follow the DASH eating plan. To follow this plan: ? Fill one half of your plate at each meal with fruits and vegetables. ? Fill one fourth of your plate at each meal with whole grains. Whole grains include whole-wheat pasta, brown rice, and whole-grain bread. ? Eat or drink low-fat dairy products, such as skim milk or low-fat yogurt. ? Fill one fourth of your plate at each meal with low-fat (lean) proteins. Low-fat proteins include fish, chicken without skin, eggs, beans, and tofu. ? Avoid fatty meat, cured and processed meat, or chicken with skin. ? Avoid pre-made or processed food.  Eat less than 1,500 mg of salt each day.  Do not drink alcohol if: ? Your doctor tells you not to drink. ? You are pregnant, may be pregnant, or are planning to become pregnant.  If you drink alcohol: ? Limit how much you use to:  0-1 drink a day for women.  0-2 drinks a day for men. ? Be aware of how much alcohol is in your drink. In the [place] one drink equals one 12 oz bottle of beer (355 mL), one 5 oz glass  of wine (148 mL), or one 1 oz glass of hard liquor (44 mL). Lifestyle   Work with your doctor to stay at a healthy weight or to lose weight. Ask your doctor what the best weight is for you.  Get at least 30 minutes of exercise most days of the week. This may include walking, swimming, or biking.  Get at least 30 minutes of exercise that strengthens your muscles (resistance exercise) at least 3 days a week. This may include lifting weights or doing Pilates.  Do not use any products that contain nicotine or tobacco, such as cigarettes, e-cigarettes,  and chewing tobacco. If you need help quitting, ask your doctor.  Check your blood pressure at home as told by your doctor.  Keep all follow-up visits as told by your doctor. This is important. Medicines  Take over-the-counter and prescription medicines only as told by your doctor. Follow directions carefully.  Do not skip doses of blood pressure medicine. The medicine does not work as well if you skip doses. Skipping doses also puts you at risk for problems.  Ask your doctor about side effects or reactions to medicines that you should watch for. Contact a doctor if you:  Think you are having a reaction to the medicine you are taking.  Have headaches that keep coming back (recurring).  Feel dizzy.  Have swelling in your ankles.  Have trouble with your vision. Get help right away if you:  Get a very bad headache.  Start to feel mixed up (confused).  Feel weak or numb.  Feel faint.  Have very bad pain in your: ? Chest. ? Belly (abdomen).  Throw up more than once.  Have trouble breathing. Summary  Hypertension is another name for high blood pressure.  High blood pressure forces your heart to work harder to pump blood.  For most people, a normal blood pressure is less than 120/80.  Making healthy choices can help lower blood pressure. If your blood pressure does not get lower with healthy choices, you may need to take medicine. This information is not intended to replace advice given to you by your health care provider. Make sure you discuss any questions you have with your health care provider. Document Released: 05/22/2008 Document Revised: 08/14/2018 Document Reviewed: 08/14/2018 Elsevier Patient Education  2020 [employer].

## 2019-09-16 NOTE — Assessment & Plan Note (Signed)
Needs eval will refer again

## 2019-09-16 NOTE — Progress Notes (Signed)
Tracy Joseph     MRN: [id number]      DOB: [birth year]   HPI Tracy Joseph is here for follow up and re-evaluation of chronic medical conditions, medication management and review of any available recent lab and radiology data.  Preventive health is updated, specifically  Cancer screening and Immunization.   Refuses flu vaccine The PT still reports inconsistency in taking her medication for blood pressure , which is the only medication she is taking, despite being recommended metformin and a statin, blood pressure remains markedly elevated. Styates had a central headache over her forehead yesterday, which is better today rated at a 5 . Denies localized weakness and numbness. 'Reports increased stress and poor sleep  ROS Denies recent fever or chills. Denies sinus pressure, nasal congestion, ear pain or sore throat. Denies chest congestion, productive cough or wheezing. Denies chest pains, palpitations and leg swelling Denies abdominal pain, nausea, vomiting,diarrhea or constipation.   Denies dysuria, frequency, hesitancy or incontinence. Denies joint pain, swelling and limitation in mobility.  Denies skin break down or rash.   PE  BP (!) 180/112   Pulse 91   Temp 98.1 F (36.7 C) (Temporal)   Resp 15   Ht 5\' 5"  (1.651 m)   Wt 236 lb (107 kg)   SpO2 95%   BMI 39.27 kg/m   Patient alert and oriented and in no cardiopulmonary distress.  HEENT: No facial asymmetry, EOMI,   oropharynx pink and moist.  Neck supple no JVD, no mass.  Chest: Clear to auscultation bilaterally.  CVS: S1, S2 no murmurs, no S3.Regular rate.  ABD: Soft non tender.   Ext: No edema  MS: Adequate ROM spine, shoulders, hips and knees.  Skin: Intact, no ulcerations or rash noted.  Psych: Good eye contact, normal affect. Memory intact not anxious or depressed appearing.  CNS: CN 2-12 intact, power,  normal throughout.no focal deficits noted.   Assessment & Plan  Malignant hypertension  Uncontrolled, no improvement, non compliant with medication Extensive education re need for adequate treatment to protect brain, kidney and heart DASH diet and commitment to daily physical activity for a minimum of 30 minutes discussed and encouraged, as a part of hypertension management. The importance of attaining a healthy weight is also discussed.  BP/Weight 09/16/2019 08/26/2019 07/08/2019 05/05/2019 02/18/2018 0000000 0000000  Systolic BP 99991111 99991111 99991111 0000000 123XX123 123456 0000000  Diastolic BP XX123456 A999333 A999333 123XX123 108 96 120  Wt. (Lbs) 236 239 239 235 236 236 234.08  BMI 39.27 39.77 39.77 39.11 39.27 39.27 37.78     Increase in triamteren dose and start labetalol, f/ in 1 week  Hyperlipidemia LDL goal <100 Hyperlipidemia:Low fat diet discussed and encouraged.   Lipid Panel  Lab Results  Component Value Date   CHOL 208 (H) 04/29/2019   HDL 45 (L) 04/29/2019   LDLCALC 133 (H) 04/29/2019   TRIG 167 (H) 04/29/2019   CHOLHDL 4.6 04/29/2019  Updated lab needed at/ before next visit. Non compliant with statin despite education re clear indication and benefit as a diabetic    Morbid obesity ([place]) Obesity linked wit hypertension and diabetes  Patient re-educated about  the importance of commitment to a  minimum of 150 minutes of exercise per week as able.  The importance of healthy food choices with portion control discussed, as well as eating regularly and within a 12 hour window most days. The need to choose "clean , green" food 50 to 75% of the time is discussed,  as well as to make water the primary drink and set a goal of 64 ounces water daily.    Weight /BMI 09/16/2019 08/26/2019 07/08/2019  WEIGHT 236 lb 239 lb 239 lb  HEIGHT 5\' 5"  5\' 5"  5\' 5"   BMI 39.27 kg/m2 39.77 kg/m2 39.77 kg/m2      Obstructive sleep apnea Needs eval will refer again  Type 2 diabetes mellitus with vascular disease ([place]) Updated lab needed at/ before next visit. Pt chose not to take medication prescribed

## 2019-09-17 ENCOUNTER — Encounter: Payer: Self-pay | Admitting: Family Medicine

## 2019-09-17 LAB — COMPLETE METABOLIC PANEL WITH GFR
AG Ratio: 1.3 (calc) (ref 1.0–2.5)
ALT: 9 U/L (ref 6–29)
AST: 15 U/L (ref 10–35)
Albumin: 4.4 g/dL (ref 3.6–5.1)
Alkaline phosphatase (APISO): 87 U/L (ref 31–125)
BUN/Creatinine Ratio: 13 (calc) (ref 6–22)
BUN: 15 mg/dL (ref 7–25)
CO2: 27 mmol/L (ref 20–32)
Calcium: 10.1 mg/dL (ref 8.6–10.2)
Chloride: 99 mmol/L (ref 98–110)
Creat: 1.14 mg/dL — ABNORMAL HIGH (ref 0.50–1.10)
GFR, Est African American: 65 mL/min/{1.73_m2} (ref >=60)
GFR, Est Non African American: 56 mL/min/{1.73_m2} — ABNORMAL LOW (ref >=60)
Globulin: 3.5 g/dL (calc) (ref 1.9–3.7)
Glucose, Bld: 138 mg/dL — ABNORMAL HIGH (ref 65–99)
Potassium: 3.6 mmol/L (ref 3.5–5.3)
Sodium: 134 mmol/L — ABNORMAL LOW (ref 135–146)
Total Bilirubin: 0.5 mg/dL (ref 0.2–1.2)
Total Protein: 7.9 g/dL (ref 6.1–8.1)

## 2019-09-17 LAB — LIPID PANEL
Cholesterol: 213 mg/dL — ABNORMAL HIGH (ref <200)
HDL: 45 mg/dL — ABNORMAL LOW (ref >=50)
LDL Cholesterol (Calc): 136 mg/dL (calc) — ABNORMAL HIGH
Non-HDL Cholesterol (Calc): 168 mg/dL (calc) — ABNORMAL HIGH (ref <130)
Total CHOL/HDL Ratio: 4.7 (calc) (ref <5.0)
Triglycerides: 183 mg/dL — ABNORMAL HIGH (ref <150)

## 2019-09-17 LAB — HEMOGLOBIN A1C
Hgb A1c MFr Bld: 7.2 % of total Hgb — ABNORMAL HIGH (ref <5.7)
Mean Plasma Glucose: 160 (calc)
eAG (mmol/L): 8.9 (calc)

## 2019-09-25 ENCOUNTER — Ambulatory Visit (INDEPENDENT_AMBULATORY_CARE_PROVIDER_SITE_OTHER): Payer: Self-pay | Admitting: Family Medicine

## 2019-09-25 ENCOUNTER — Ambulatory Visit: Payer: Self-pay | Admitting: Family Medicine

## 2019-09-25 ENCOUNTER — Encounter: Payer: Self-pay | Admitting: Family Medicine

## 2019-09-25 ENCOUNTER — Other Ambulatory Visit: Payer: Self-pay

## 2019-09-25 VITALS — BP 160/100 | HR 88 | Temp 97.8°F | Resp 15 | Ht 65.0 in | Wt 240.0 lb

## 2019-09-25 DIAGNOSIS — E1159 Type 2 diabetes mellitus with other circulatory complications: Secondary | ICD-10-CM

## 2019-09-25 DIAGNOSIS — I10 Essential (primary) hypertension: Secondary | ICD-10-CM

## 2019-09-25 DIAGNOSIS — E785 Hyperlipidemia, unspecified: Secondary | ICD-10-CM

## 2019-09-25 MED ORDER — LABETALOL HCL 300 MG PO TABS: 300.0000 mg | ORAL_TABLET | Freq: Two times a day (BID) | ORAL | 3 refills | Status: DC

## 2019-09-25 MED ORDER — ATORVASTATIN CALCIUM 10 MG PO TABS: 10.0000 mg | ORAL_TABLET | Freq: Every day | ORAL | 5 refills | Status: DC

## 2019-09-25 NOTE — Patient Instructions (Addendum)
F/U in December in office with MD for BP re eval, call if you need me sooner  Increase labetalol to 1.5 tabs (200mg ) twice daily till done. New script is for 300 mg one twice daily  Continue other meds as before for BP, hydralazine and triamterene  New ( samples 0 aspirin 81 mg ione daily ( heart protection)  New also for heart protection is generic lipitor one at bedtime  BP is better keep working to get to goal

## 2019-09-26 ENCOUNTER — Encounter: Payer: Self-pay | Admitting: Family Medicine

## 2019-09-26 NOTE — Assessment & Plan Note (Signed)
Encouraged metformin however, pt declines, wants dietary management only at this time, repeat in 3 months Tracy Joseph is reminded of the importance of commitment to daily physical activity for 30 minutes or more, as able and the need to limit carbohydrate intake to 30 to 60 grams per meal to help with blood sugar control.      Tracy Joseph is reminded of the importance of daily foot exam, annual eye examination, and good blood sugar, blood pressure and cholesterol control.  Diabetic Labs Latest Ref Rng & Units 09/16/2019 04/29/2019 01/24/2017 06/14/2016 12/15/2015  HbA1c <5.7 % of total Hgb 7.2(H) 7.0(H) 6.0(H) 5.9(H) 6.3(H)  Chol <200 mg/dL 213(H) 208(H) 199 190 -  HDL > OR = 50 mg/dL 45(L) 45(L) 53 45(L) -  Calc LDL mg/dL (calc) 136(H) 133(H) 123(H) 113 -  Triglycerides <150 mg/dL 183(H) 167(H) 115 160(H) -  Creatinine 0.50 - 1.10 mg/dL 1.14(H) 1.08 1.11(H) 1.02 0.88   BP/Weight 09/25/2019 09/16/2019 08/26/2019 07/08/2019 05/05/2019 02/18/2018 0000000  Systolic BP 0000000 99991111 99991111 99991111 0000000 123XX123 123456  Diastolic BP 123XX123 XX123456 A999333 A999333 100 108 96  Wt. (Lbs) 240 236 239 239 235 236 236  BMI 39.94 39.27 39.77 39.77 39.11 39.27 39.27   Foot/eye exam completion dates 05/05/2019  Foot Form Completion Done

## 2019-09-26 NOTE — Assessment & Plan Note (Signed)
Improved, though still not art goal. Incrase labetalol dose DASH diet and commitment to daily physical activity for a minimum of 30 minutes discussed and encouraged, as a part of hypertension management. The importance of attaining a healthy weight is also discussed.  BP/Weight 09/25/2019 09/16/2019 08/26/2019 07/08/2019 05/05/2019 02/18/2018 0000000  Systolic BP 0000000 99991111 99991111 99991111 0000000 123XX123 123456  Diastolic BP 123XX123 XX123456 A999333 A999333 100 108 96  Wt. (Lbs) 240 236 239 239 235 236 236  BMI 39.94 39.27 39.77 39.77 39.11 39.27 39.27

## 2019-09-26 NOTE — Assessment & Plan Note (Signed)
Hyperlipidemia:Low fat diet discussed and encouraged.   Lipid Panel  Lab Results  Component Value Date   CHOL 213 (H) 09/16/2019   HDL 45 (L) 09/16/2019   LDLCALC 136 (H) 09/16/2019   TRIG 183 (H) 09/16/2019   CHOLHDL 4.7 09/16/2019     Start daily statin, she agrees

## 2019-09-26 NOTE — Progress Notes (Signed)
   Tracy Joseph     MRN: [id number]      DOB: [birth year]   HPI Tracy Joseph is here for follow up and re-evaluation of chronic medical conditions, medication management and review of any available recent lab and radiology data.  She is here specifically rto review uncontrolled hypertension. She reports taking her medication faithfully and denies any adverse s/e     ROS Denies recent fever or chills. Denies sinus pressure, nasal congestion, ear pain or sore throat. Denies chest congestion, productive cough or wheezing. Denies chest pains, palpitations and leg swelling   PE  BP (!) 160/100   Pulse 88   Temp 97.8 F (36.6 C) (Temporal)   Resp 15   Ht 5\' 5"  (1.651 m)   Wt 240 lb (108.9 kg)   SpO2 95%   BMI 39.94 kg/m   Patient alert and oriented and in no cardiopulmonary distress.  HEENT: No facial asymmetry, EOMI,   oropharynx pink and moist.  Neck supple no JVD, no mass.  Chest: Clear to auscultation bilaterally.  CVS: S1, S2 no murmurs, no S3.Regular rate.  Ext: No edema   Assessment & Plan  Malignant hypertension Improved, though still not art goal. Incrase labetalol dose DASH diet and commitment to daily physical activity for a minimum of 30 minutes discussed and encouraged, as a part of hypertension management. The importance of attaining a healthy weight is also discussed.  BP/Weight 09/25/2019 09/16/2019 08/26/2019 07/08/2019 05/05/2019 02/18/2018 0000000  Systolic BP 0000000 99991111 99991111 99991111 0000000 123XX123 123456  Diastolic BP 123XX123 XX123456 A999333 A999333 100 108 96  Wt. (Lbs) 240 236 239 239 235 236 236  BMI 39.94 39.27 39.77 39.77 39.11 39.27 39.27       Type 2 diabetes mellitus with vascular disease ([place]) Encouraged metformin however, pt declines, wants dietary management only at this time, repeat in 3 months Tracy Joseph is reminded of the importance of commitment to daily physical activity for 30 minutes or more, as able and the need to limit carbohydrate intake to 30 to 60 grams  per meal to help with blood sugar control.      Tracy Joseph is reminded of the importance of daily foot exam, annual eye examination, and good blood sugar, blood pressure and cholesterol control.  Diabetic Labs Latest Ref Rng & Units 09/16/2019 04/29/2019 01/24/2017 06/14/2016 12/15/2015  HbA1c <5.7 % of total Hgb 7.2(H) 7.0(H) 6.0(H) 5.9(H) 6.3(H)  Chol <200 mg/dL 213(H) 208(H) 199 190 -  HDL > OR = 50 mg/dL 45(L) 45(L) 53 45(L) -  Calc LDL mg/dL (calc) 136(H) 133(H) 123(H) 113 -  Triglycerides <150 mg/dL 183(H) 167(H) 115 160(H) -  Creatinine 0.50 - 1.10 mg/dL 1.14(H) 1.08 1.11(H) 1.02 0.88   BP/Weight 09/25/2019 09/16/2019 08/26/2019 07/08/2019 05/05/2019 02/18/2018 0000000  Systolic BP 0000000 99991111 99991111 99991111 0000000 123XX123 123456  Diastolic BP 123XX123 XX123456 A999333 A999333 100 108 96  Wt. (Lbs) 240 236 239 239 235 236 236  BMI 39.94 39.27 39.77 39.77 39.11 39.27 39.27   Foot/eye exam completion dates 05/05/2019  Foot Form Completion Done        Hyperlipidemia LDL goal <100 Hyperlipidemia:Low fat diet discussed and encouraged.   Lipid Panel  Lab Results  Component Value Date   CHOL 213 (H) 09/16/2019   HDL 45 (L) 09/16/2019   LDLCALC 136 (H) 09/16/2019   TRIG 183 (H) 09/16/2019   CHOLHDL 4.7 09/16/2019     Start daily statin, she agrees

## 2019-10-08 ENCOUNTER — Other Ambulatory Visit: Payer: Self-pay | Admitting: Family Medicine

## 2019-11-29 ENCOUNTER — Other Ambulatory Visit: Payer: Self-pay | Admitting: Family Medicine

## 2019-12-01 ENCOUNTER — Telehealth: Payer: Self-pay | Admitting: *Deleted

## 2019-12-01 ENCOUNTER — Other Ambulatory Visit: Payer: Self-pay

## 2019-12-01 MED ORDER — LEVOTHYROXINE SODIUM 175 MCG PO TABS: ORAL_TABLET | ORAL | 1 refills | Status: DC

## 2019-12-01 MED ORDER — HYDRALAZINE HCL 100 MG PO TABS: 100.0000 mg | ORAL_TABLET | Freq: Two times a day (BID) | ORAL | 1 refills | Status: DC

## 2019-12-01 MED ORDER — TRIAMTERENE-HCTZ 75-50 MG PO TABS: 1.0000 | ORAL_TABLET | Freq: Every day | ORAL | 5 refills | Status: DC

## 2019-12-01 MED ORDER — LABETALOL HCL 300 MG PO TABS: 300.0000 mg | ORAL_TABLET | Freq: Two times a day (BID) | ORAL | 3 refills | Status: DC

## 2019-12-01 MED ORDER — ATORVASTATIN CALCIUM 10 MG PO TABS: 10.0000 mg | ORAL_TABLET | Freq: Every day | ORAL | 5 refills | Status: DC

## 2019-12-01 NOTE — Telephone Encounter (Signed)
All meds were refilled since I did not know which one the patient needed

## 2019-12-01 NOTE — Telephone Encounter (Signed)
Pt said she called in three prescriptions she went to pick them up and only received 3 she doesn't know which one was denied but she said one was denied by dr Moshe Cipro and she needs that sent in

## 2019-12-16 ENCOUNTER — Ambulatory Visit: Payer: Self-pay | Admitting: Family Medicine

## 2020-03-26 ENCOUNTER — Other Ambulatory Visit: Payer: Self-pay | Admitting: Family Medicine

## 2020-07-22 IMAGING — MG MM DIGITAL DIAGNOSTIC UNILAT*R* W/ TOMO W/ CAD
4 series · 4 of 12 positions shown · non-contrast
Comparison: Previous exam(s).

CLINICAL DATA: Screening recall for possible right breast asymmetry
seen on the CC view only.

EXAM:
DIGITAL DIAGNOSTIC UNILATERAL RIGHT MAMMOGRAM WITH CAD AND TOMO

[R CC synth-2D]
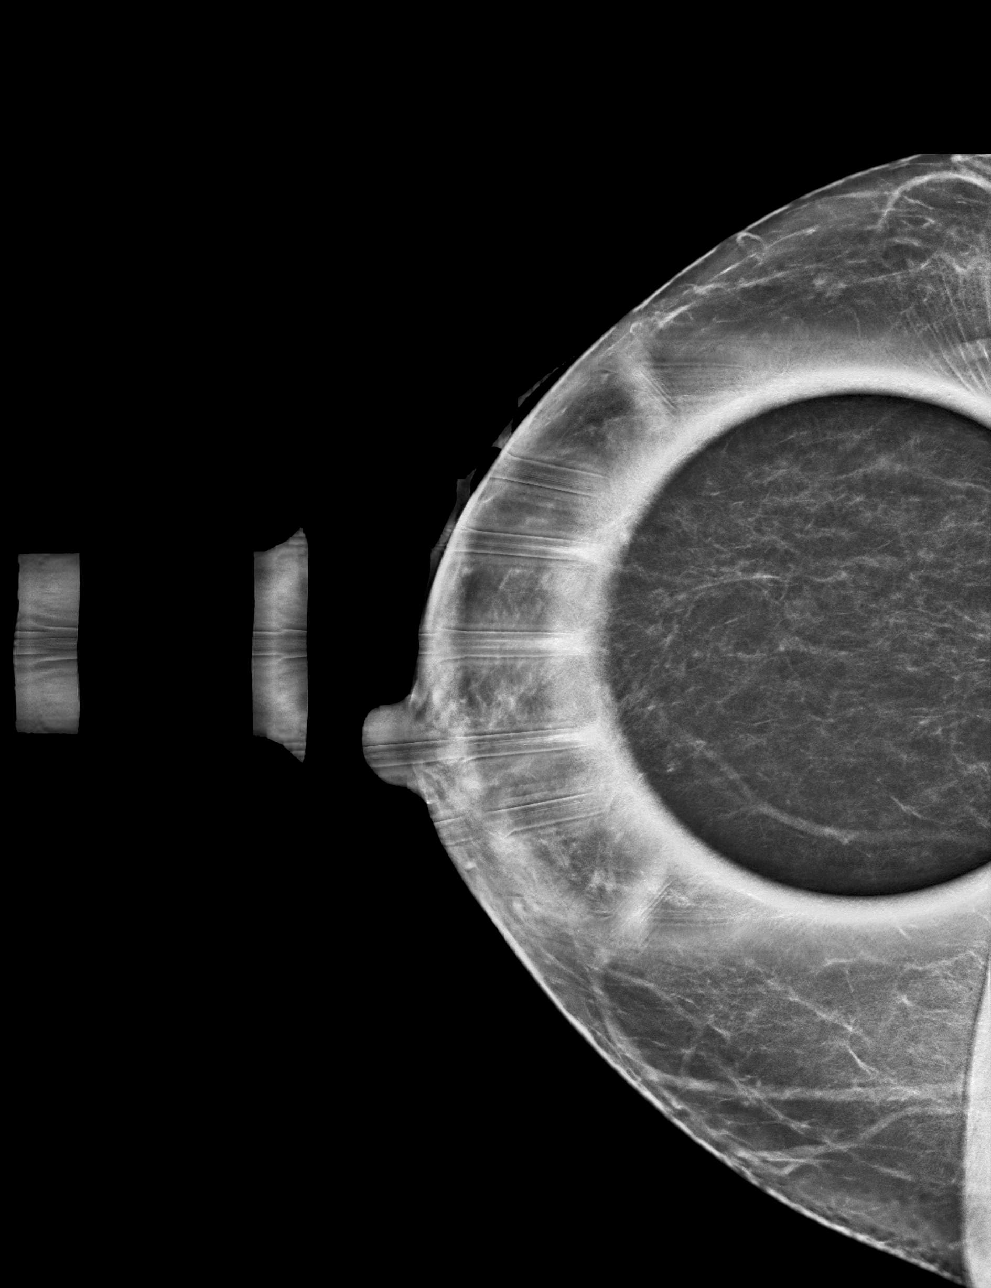

[R ML synth-2D]
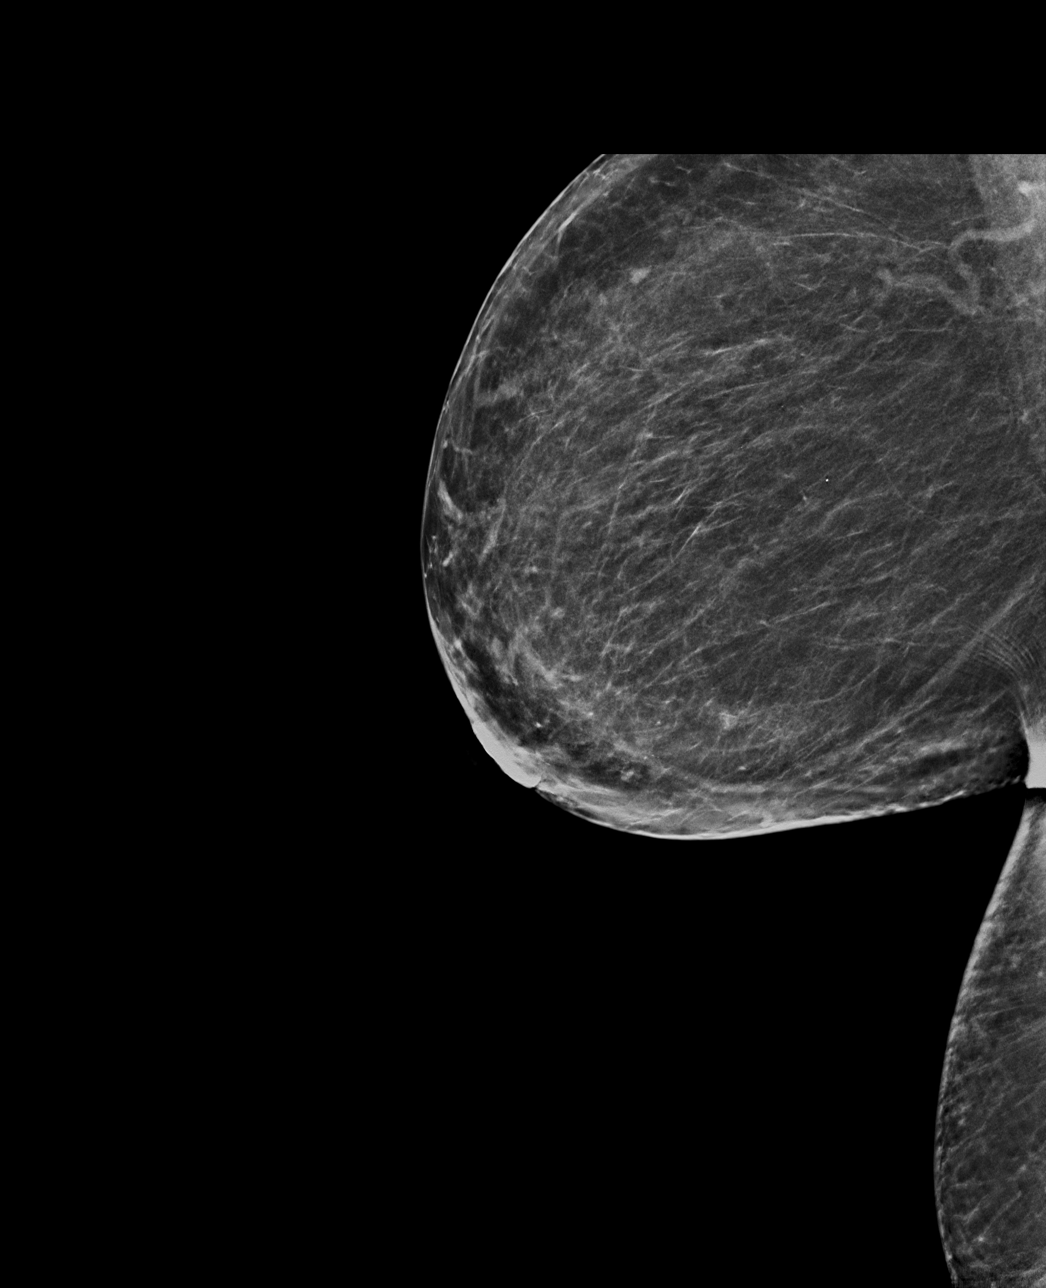

[R CC tomo · tomo slice 29/56.0]
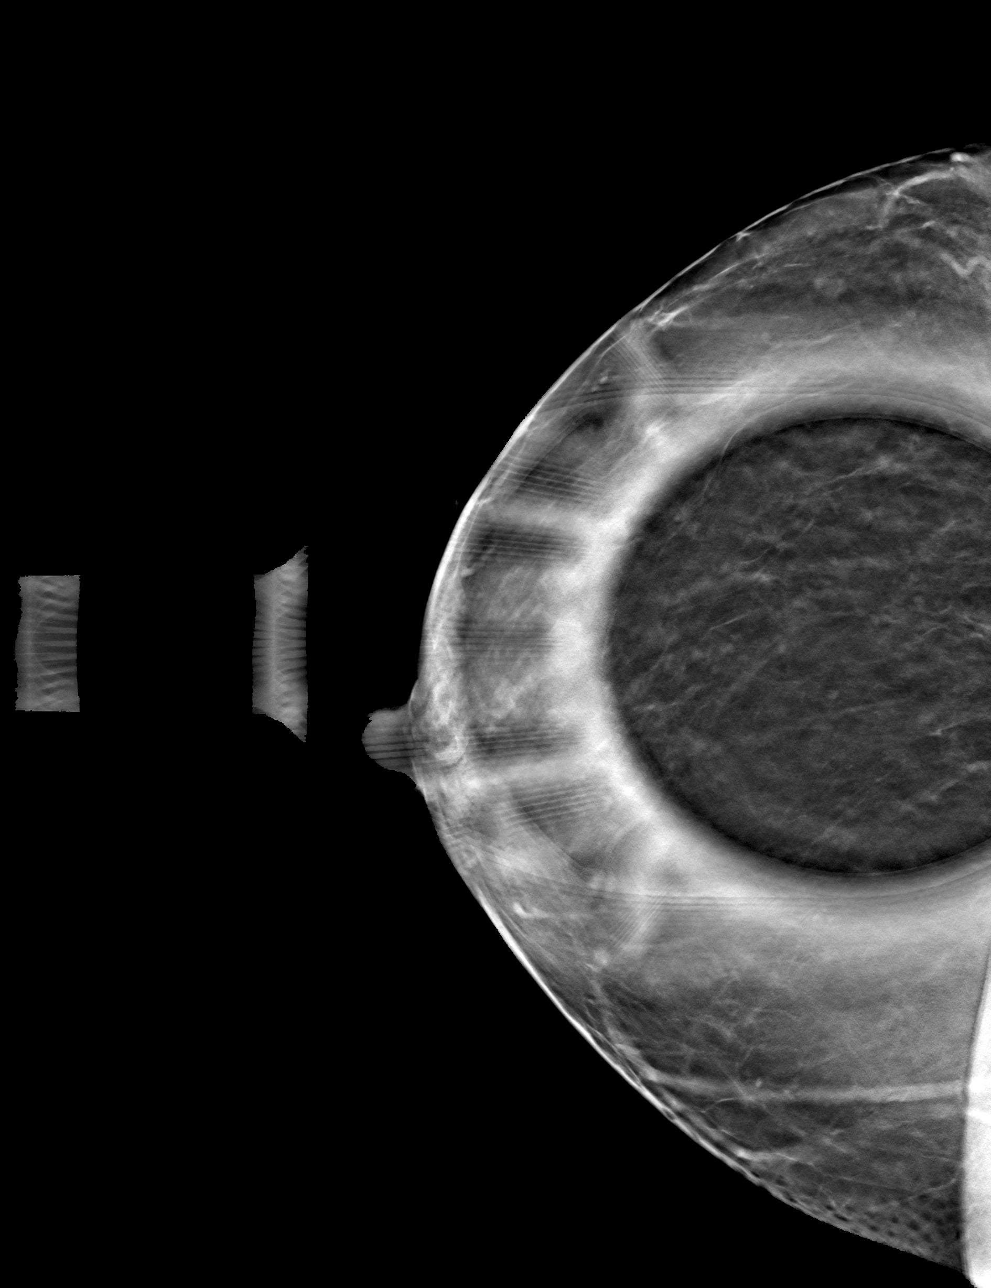

[R ML tomo · tomo slice 35/69.0]
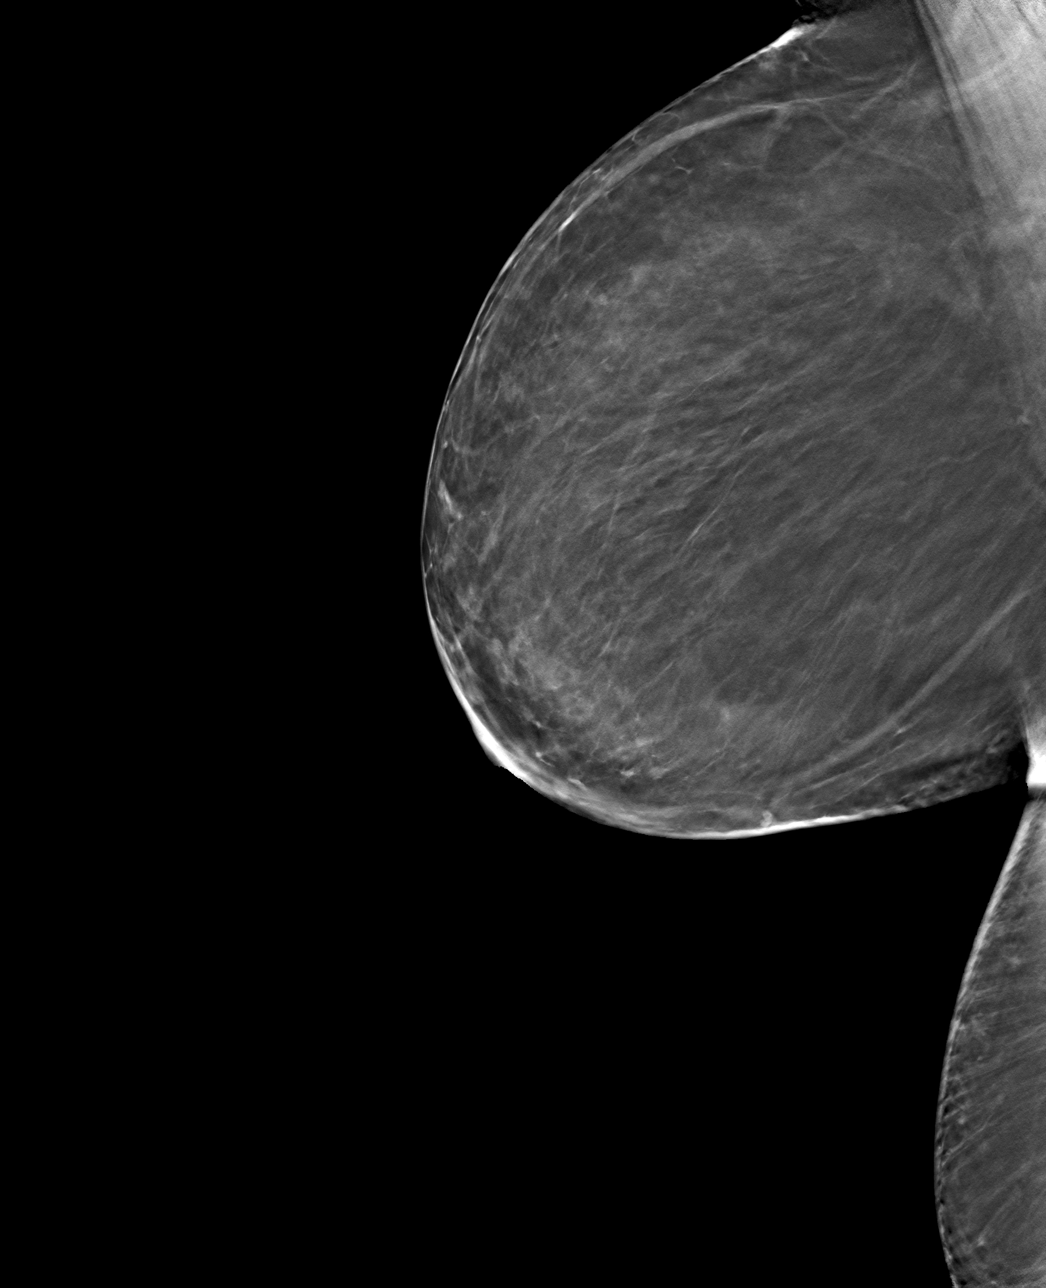

[4 of 12 positions shown; findings below may reference images not displayed]

ACR Breast Density Category b: There are scattered areas of
fibroglandular density.
FINDINGS: Additional tomograms were performed of the right breast. The
initially questioned possible right breast asymmetry resolves on the
additional imaging with the fibroglandular pattern unchanged when
compared to the prior exams. There is no mammographic evidence of
malignancy in the right breast.

Mammographic images were processed with CAD.
IMPRESSION: No mammographic evidence of malignancy in the right breast.

RECOMMENDATION:
Screening mammogram in one year.(Code:RL-[id number])

I have discussed the findings and recommendations with the patient.
Results were also provided in writing at the conclusion of the
visit. If applicable, a reminder letter will be sent to the patient
regarding the next appointment.

BI-RADS CATEGORY  1: Negative.

## 2020-10-12 ENCOUNTER — Other Ambulatory Visit: Payer: Self-pay

## 2020-10-12 ENCOUNTER — Encounter: Payer: Self-pay | Admitting: Family Medicine

## 2020-10-12 ENCOUNTER — Ambulatory Visit (INDEPENDENT_AMBULATORY_CARE_PROVIDER_SITE_OTHER): Payer: Self-pay | Admitting: Family Medicine

## 2020-10-12 ENCOUNTER — Other Ambulatory Visit: Payer: Self-pay | Admitting: Family Medicine

## 2020-10-12 VITALS — BP 202/139 | HR 92 | Resp 16 | Ht 65.0 in | Wt 224.0 lb

## 2020-10-12 DIAGNOSIS — E1159 Type 2 diabetes mellitus with other circulatory complications: Secondary | ICD-10-CM

## 2020-10-12 DIAGNOSIS — G4733 Obstructive sleep apnea (adult) (pediatric): Secondary | ICD-10-CM

## 2020-10-12 DIAGNOSIS — E785 Hyperlipidemia, unspecified: Secondary | ICD-10-CM

## 2020-10-12 DIAGNOSIS — R0683 Snoring: Secondary | ICD-10-CM

## 2020-10-12 DIAGNOSIS — E890 Postprocedural hypothyroidism: Secondary | ICD-10-CM

## 2020-10-12 DIAGNOSIS — I10 Essential (primary) hypertension: Secondary | ICD-10-CM

## 2020-10-12 DIAGNOSIS — Z23 Encounter for immunization: Secondary | ICD-10-CM

## 2020-10-12 DIAGNOSIS — Z1231 Encounter for screening mammogram for malignant neoplasm of breast: Secondary | ICD-10-CM

## 2020-10-12 LAB — POCT GLYCOSYLATED HEMOGLOBIN (HGB A1C): Hemoglobin A1C: 6.1 % — AB (ref 4.0–5.6)

## 2020-10-12 MED ORDER — TRIAMTERENE-HCTZ 75-50 MG PO TABS: 1.0000 | ORAL_TABLET | Freq: Every day | ORAL | 1 refills | Status: DC

## 2020-10-12 NOTE — Patient Instructions (Addendum)
F/u in office with MD iin earlyDecemberr , call if you need me sooner  Please schedule mammogram at checkout Blood pressure VERY high and uncontrolled , if you develop headache , blurred vision, weakness go to the ED Improved blood sugar good  Resume triamterene and continue the other 2 blood pressure medications as you are taking  Need to make and keep appointment with Cardiology for uncontrolled hypertension   Need to make and keep appointment to evaluate snoring  Flu vaccine today  Use antifungal spray on right foot between 4th and 5th toes  EKG in office today  Labs today CBC, lipid, cmp and eGFR, tSH

## 2020-10-13 ENCOUNTER — Telehealth: Payer: Self-pay

## 2020-10-13 NOTE — Telephone Encounter (Signed)
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 She stated that she has to go back to work. She has been out of work for a year and a half. Stated she wants to speak with provider.

## 2020-10-13 NOTE — Telephone Encounter (Signed)
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

## 2020-10-15 ENCOUNTER — Other Ambulatory Visit: Payer: Self-pay

## 2020-10-15 ENCOUNTER — Ambulatory Visit (INDEPENDENT_AMBULATORY_CARE_PROVIDER_SITE_OTHER): Payer: Self-pay | Admitting: Internal Medicine

## 2020-10-15 ENCOUNTER — Encounter: Payer: Self-pay | Admitting: Internal Medicine

## 2020-10-15 ENCOUNTER — Encounter: Payer: Self-pay | Admitting: Family Medicine

## 2020-10-15 VITALS — BP 205/135 | HR 93 | Ht 65.0 in | Wt 221.0 lb

## 2020-10-15 DIAGNOSIS — I10 Essential (primary) hypertension: Secondary | ICD-10-CM

## 2020-10-15 DIAGNOSIS — E7849 Other hyperlipidemia: Secondary | ICD-10-CM

## 2020-10-15 DIAGNOSIS — E119 Type 2 diabetes mellitus without complications: Secondary | ICD-10-CM

## 2020-10-15 DIAGNOSIS — I160 Hypertensive urgency: Secondary | ICD-10-CM

## 2020-10-15 DIAGNOSIS — G4733 Obstructive sleep apnea (adult) (pediatric): Secondary | ICD-10-CM

## 2020-10-15 DIAGNOSIS — N1831 Chronic kidney disease, stage 3a: Secondary | ICD-10-CM | POA: Insufficient documentation

## 2020-10-15 MED ORDER — SPIRONOLACTONE 25 MG PO TABS: 25.0000 mg | ORAL_TABLET | Freq: Every day | ORAL | 3 refills | Status: DC

## 2020-10-15 MED ORDER — ATORVASTATIN CALCIUM 20 MG PO TABS: 20.0000 mg | ORAL_TABLET | Freq: Every day | ORAL | 3 refills | Status: DC

## 2020-10-15 NOTE — Assessment & Plan Note (Addendum)
Tracy Joseph is reminded of the importance of commitment to daily physical activity for 30 minutes or more, as able and the need to limit carbohydrate intake to 30 to 60 grams per meal to help with blood sugar control.  Improved with diet  And exercise , she is applauded on this The need to take medication as prescribed, test blood sugar as directed, and to call between visits if there is a concern that blood sugar is uncontrolled is also discussed.   Tracy Joseph is reminded of the importance of daily foot exam, annual eye examination, and good blood sugar, blood pressure and cholesterol control. Improved  Diabetic Labs Latest Ref Rng & Units 10/12/2020 09/16/2019 04/29/2019 01/24/2017 06/14/2016  HbA1c 4.0 - 5.6 % 6.1(A) 7.2(H) 7.0(H) 6.0(H) 5.9(H)  Chol <200 mg/dL - 213(H) 208(H) 199 190  HDL > OR = 50 mg/dL - 45(L) 45(L) 53 45(L)  Calc LDL mg/dL (calc) - 136(H) 133(H) 123(H) 113  Triglycerides <150 mg/dL - 183(H) 167(H) 115 160(H)  Creatinine 0.50 - 1.10 mg/dL - 1.14(H) 1.08 1.11(H) 1.02   BP/Weight 10/15/2020 10/12/2020 09/25/2019 09/16/2019 08/26/2019 07/08/2019 1/62/4469  Systolic BP 507 225 750 518 335 825 189  Diastolic BP 842 103 128 118 110 110 100  Wt. (Lbs) 221 224 240 236 239 239 235  BMI 36.78 37.28 39.94 39.27 39.77 39.77 39.11   Foot/eye exam completion dates 05/05/2019  Foot Form Completion Done

## 2020-10-15 NOTE — Telephone Encounter (Signed)
Signed patients flu statement that I administered her flu vaccine. Made copy of health statement and stamped it. Explained to patient that she was getting a stamped copy of the statement and provider would sign statement on Monday. She took the stamped copy with her. She said she had orientation starting at 8am on Monday and didn't get out until 5pm and to just mail her the signed copy. Statement in providers box waiting signature.

## 2020-10-15 NOTE — Progress Notes (Signed)
Cardiology Office Note:    Date:  10/15/2020   ID:  Tracy Joseph, DOB 18-Nov-1971, MRN [id number]  PCP:  [name], MD  [facility] Cardiologist:  Werner Lean, MD   QI:[id number] hypertension Consulted for the evaluation of malignant hypertension at the behest of [name], MD  History of Present Illness:    CASEY MAXFIELD is a 49 y.o. female with a hx of Morbid Obesity, Diabetes with Hypertension, OSA  Needing sleep specialist and HLD who presents for malignant pressure.  Patient notes that she has no chest pain, no shortness of breath, no DOE no headaches, no vision changes presently.  Feels fine.  Dealing with stress from a separation.  Has lost weight.  Starting a new job and will have insurance next week. No chest pressure, no syncope.  ACEi had cough, norvasc had leg swelling.  Ambulatory blood pressure is 200/140  Past Medical History:  Diagnosis Date   Essential hypertension    Goiter    Initially hyperthyroid treated with RAI, now hypothyroid - follows with Dr. [name]   Hypertension    Phreesia 10/12/2020   Obstructive sleep apnea    Suspected    Prediabetes    Seasonal allergies    Tubular adenoma of colon    Past Surgical History:  Procedure Laterality Date   COLONOSCOPY N/A 05/03/2017   Procedure: COLONOSCOPY;  Surgeon: Rogene [place], MD;  [facility];  Service: Endoscopy;  Laterality: N/A;  930   TUBAL LIGATION  2005   Current Medications: Current Meds  Medication Sig   acetaminophen (TYLENOL) 500 MG tablet Take 1,000 mg by mouth daily as needed for headache.   cetirizine (ZYRTEC) 10 MG tablet Take 10 mg by mouth daily as needed for allergies.   hydrALAZINE (APRESOLINE) 100 MG tablet Take 1 tablet (100 mg total) by mouth 2 (two) times daily.   labetalol (NORMODYNE) 300 MG tablet Take 1 tablet (300 mg total) by mouth 2 (two) times daily.   levothyroxine (SYNTHROID) 175 MCG tablet TAKE ONE AND A  HALF TABLETS EVERY MONDAY, WEDNESDAY AND FRIDAY, BY MOUTH. TAKE ONE TABLET ONCE DAILY EVERY TUESDAY, THURSDAY, SATURDAY AND SUNDAY   sodium chloride (OCEAN) 0.65 % SOLN nasal spray Place 1 spray into both nostrils as needed for congestion.   triamterene-hydrochlorothiazide (MAXZIDE) 75-50 MG tablet Take 1 tablet by mouth daily.    Allergies:   Ace inhibitors, Amlodipine, Dust mite extract, and Pollen extract   Social History   Socioeconomic History   Marital status: Married    Spouse name: Not on file   Number of children: 1   Years of education: Not on file   Highest education level: Not on file  Occupational History   Occupation: full time student for L[employer]  Tobacco Use   Smoking status: Never Smoker   Smokeless tobacco: Never Used  Vaping Use   Vaping Use: Never used  Substance and Sexual Activity   Alcohol use: No   Drug use: No   Sexual activity: Yes  Other Topics Concern   Not on file  Social History Narrative   Not on file   Social Determinants of Health   Financial Resource Strain:    Difficulty of Paying Living Expenses: Not on file  Food Insecurity:    Worried About [place] in the Last Year: Not on file   Ran Out of Food in the Last Year: Not on file  Transportation Needs:  °Cardiology Office Note:   ° °Date:  10/15/2020  ° °ID:  Tracy Joseph, DOB 12/14/1971, MRN [id number] ° °PCP:  Simpson, Margaret E, MD  °[facility] Cardiologist:   A , MD  ° °CC:malignant hypertension °Consulted for the evaluation of malignant hypertension at the behest of Simpson, Margaret E, MD ° °History of Present Illness:   ° °Tracy Joseph is a 49 y.o. female with a hx of Morbid Obesity, Diabetes with Hypertension, OSA  Needing sleep specialist and HLD who presents for malignant pressure. ° °Patient notes that she has no chest pain, no shortness of breath, no DOE no headaches, no vision changes presently.  Feels fine.  Dealing with stress from a separation.  Has lost weight.  Starting a new job and will have insurance next week. No chest pressure, no syncope.  ACEi had cough, norvasc had leg swelling. ° °Ambulatory blood pressure is 200/140 ° °Past Medical History:  °Diagnosis Date  °• Essential hypertension   °• Goiter   ° Initially hyperthyroid treated with RAI, now hypothyroid - follows with Dr. Ellison  °• Hypertension   ° Phreesia 10/12/2020  °• Obstructive sleep apnea   ° Suspected   °• Prediabetes   °• Seasonal allergies   °• Tubular adenoma of colon   ° °Past Surgical History:  °Procedure Laterality Date  °• COLONOSCOPY N/A 05/03/2017  ° Procedure: COLONOSCOPY;  Surgeon: Rehman, Najeeb U, MD;  [facility];  Service: Endoscopy;  Laterality: N/A;  930  °• TUBAL LIGATION  2005  ° °Current Medications: °Current Meds  °Medication Sig  °• acetaminophen (TYLENOL) 500 MG tablet Take 1,000 mg by mouth daily as needed for headache.  °• cetirizine (ZYRTEC) 10 MG tablet Take 10 mg by mouth daily as needed for allergies.  °• hydrALAZINE (APRESOLINE) 100 MG tablet Take 1 tablet (100 mg total) by mouth 2 (two) times daily.  °• labetalol (NORMODYNE) 300 MG tablet Take 1 tablet (300 mg total) by mouth 2 (two) times daily.  °• levothyroxine (SYNTHROID) 175 MCG tablet TAKE ONE AND A  HALF TABLETS EVERY MONDAY, WEDNESDAY AND FRIDAY, BY MOUTH. TAKE ONE TABLET ONCE DAILY EVERY TUESDAY, THURSDAY, SATURDAY AND SUNDAY  °• sodium chloride (OCEAN) 0.65 % SOLN nasal spray Place 1 spray into both nostrils as needed for congestion.  °• triamterene-hydrochlorothiazide (MAXZIDE) 75-50 MG tablet Take 1 tablet by mouth daily.  °  °Allergies:   Ace inhibitors, Amlodipine, Dust mite extract, and Pollen extract  ° °Social History  ° °Socioeconomic History  °• Marital status: Married  °  Spouse name: Not on file  °• Number of children: 1  °• Years of education: Not on file  °• Highest education level: Not on file  °Occupational History  °• Occupation: full time student for LPN  °Tobacco Use  °• Smoking status: Never Smoker  °• Smokeless tobacco: Never Used  °Vaping Use  °• Vaping Use: Never used  °Substance and Sexual Activity  °• Alcohol use: No  °• Drug use: No  °• Sexual activity: Yes  °Other Topics Concern  °• Not on file  °Social History Narrative  °• Not on file  ° °Social Determinants of Health  ° °Financial Resource Strain:   °• Difficulty of Paying Living Expenses: Not on file  °Food Insecurity:   °• Worried About Running Out of Food in the Last Year: Not on file  °• Ran Out of Food in the Last Year: Not on file  °Transportation Needs:   °•   °Cardiology Office Note:   ° °Date:  10/15/2020  ° °ID:  Tracy Joseph, DOB 12/14/1971, MRN [id number] ° °PCP:  Simpson, Margaret E, MD  °[facility] Cardiologist:   A , MD  ° °CC:malignant hypertension °Consulted for the evaluation of malignant hypertension at the behest of Simpson, Margaret E, MD ° °History of Present Illness:   ° °Tracy Joseph is a 49 y.o. female with a hx of Morbid Obesity, Diabetes with Hypertension, OSA  Needing sleep specialist and HLD who presents for malignant pressure. ° °Patient notes that she has no chest pain, no shortness of breath, no DOE no headaches, no vision changes presently.  Feels fine.  Dealing with stress from a separation.  Has lost weight.  Starting a new job and will have insurance next week. No chest pressure, no syncope.  ACEi had cough, norvasc had leg swelling. ° °Ambulatory blood pressure is 200/140 ° °Past Medical History:  °Diagnosis Date  °• Essential hypertension   °• Goiter   ° Initially hyperthyroid treated with RAI, now hypothyroid - follows with Dr. Ellison  °• Hypertension   ° Phreesia 10/12/2020  °• Obstructive sleep apnea   ° Suspected   °• Prediabetes   °• Seasonal allergies   °• Tubular adenoma of colon   ° °Past Surgical History:  °Procedure Laterality Date  °• COLONOSCOPY N/A 05/03/2017  ° Procedure: COLONOSCOPY;  Surgeon: Rehman, Najeeb U, MD;  [facility];  Service: Endoscopy;  Laterality: N/A;  930  °• TUBAL LIGATION  2005  ° °Current Medications: °Current Meds  °Medication Sig  °• acetaminophen (TYLENOL) 500 MG tablet Take 1,000 mg by mouth daily as needed for headache.  °• cetirizine (ZYRTEC) 10 MG tablet Take 10 mg by mouth daily as needed for allergies.  °• hydrALAZINE (APRESOLINE) 100 MG tablet Take 1 tablet (100 mg total) by mouth 2 (two) times daily.  °• labetalol (NORMODYNE) 300 MG tablet Take 1 tablet (300 mg total) by mouth 2 (two) times daily.  °• levothyroxine (SYNTHROID) 175 MCG tablet TAKE ONE AND A  HALF TABLETS EVERY MONDAY, WEDNESDAY AND FRIDAY, BY MOUTH. TAKE ONE TABLET ONCE DAILY EVERY TUESDAY, THURSDAY, SATURDAY AND SUNDAY  °• sodium chloride (OCEAN) 0.65 % SOLN nasal spray Place 1 spray into both nostrils as needed for congestion.  °• triamterene-hydrochlorothiazide (MAXZIDE) 75-50 MG tablet Take 1 tablet by mouth daily.  °  °Allergies:   Ace inhibitors, Amlodipine, Dust mite extract, and Pollen extract  ° °Social History  ° °Socioeconomic History  °• Marital status: Married  °  Spouse name: Not on file  °• Number of children: 1  °• Years of education: Not on file  °• Highest education level: Not on file  °Occupational History  °• Occupation: full time student for LPN  °Tobacco Use  °• Smoking status: Never Smoker  °• Smokeless tobacco: Never Used  °Vaping Use  °• Vaping Use: Never used  °Substance and Sexual Activity  °• Alcohol use: No  °• Drug use: No  °• Sexual activity: Yes  °Other Topics Concern  °• Not on file  °Social History Narrative  °• Not on file  ° °Social Determinants of Health  ° °Financial Resource Strain:   °• Difficulty of Paying Living Expenses: Not on file  °Food Insecurity:   °• Worried About Running Out of Food in the Last Year: Not on file  °• Ran Out of Food in the Last Year: Not on file  °Transportation Needs:   °•   °Cardiology Office Note:   ° °Date:  10/15/2020  ° °ID:  Tracy Joseph, DOB 12/14/1971, MRN [id number] ° °PCP:  Simpson, Margaret E, MD  °[facility] Cardiologist:   A , MD  ° °CC:malignant hypertension °Consulted for the evaluation of malignant hypertension at the behest of Simpson, Margaret E, MD ° °History of Present Illness:   ° °Tracy Joseph is a 49 y.o. female with a hx of Morbid Obesity, Diabetes with Hypertension, OSA  Needing sleep specialist and HLD who presents for malignant pressure. ° °Patient notes that she has no chest pain, no shortness of breath, no DOE no headaches, no vision changes presently.  Feels fine.  Dealing with stress from a separation.  Has lost weight.  Starting a new job and will have insurance next week. No chest pressure, no syncope.  ACEi had cough, norvasc had leg swelling. ° °Ambulatory blood pressure is 200/140 ° °Past Medical History:  °Diagnosis Date  °• Essential hypertension   °• Goiter   ° Initially hyperthyroid treated with RAI, now hypothyroid - follows with Dr. Ellison  °• Hypertension   ° Phreesia 10/12/2020  °• Obstructive sleep apnea   ° Suspected   °• Prediabetes   °• Seasonal allergies   °• Tubular adenoma of colon   ° °Past Surgical History:  °Procedure Laterality Date  °• COLONOSCOPY N/A 05/03/2017  ° Procedure: COLONOSCOPY;  Surgeon: Rehman, Najeeb U, MD;  [facility];  Service: Endoscopy;  Laterality: N/A;  930  °• TUBAL LIGATION  2005  ° °Current Medications: °Current Meds  °Medication Sig  °• acetaminophen (TYLENOL) 500 MG tablet Take 1,000 mg by mouth daily as needed for headache.  °• cetirizine (ZYRTEC) 10 MG tablet Take 10 mg by mouth daily as needed for allergies.  °• hydrALAZINE (APRESOLINE) 100 MG tablet Take 1 tablet (100 mg total) by mouth 2 (two) times daily.  °• labetalol (NORMODYNE) 300 MG tablet Take 1 tablet (300 mg total) by mouth 2 (two) times daily.  °• levothyroxine (SYNTHROID) 175 MCG tablet TAKE ONE AND A  HALF TABLETS EVERY MONDAY, WEDNESDAY AND FRIDAY, BY MOUTH. TAKE ONE TABLET ONCE DAILY EVERY TUESDAY, THURSDAY, SATURDAY AND SUNDAY  °• sodium chloride (OCEAN) 0.65 % SOLN nasal spray Place 1 spray into both nostrils as needed for congestion.  °• triamterene-hydrochlorothiazide (MAXZIDE) 75-50 MG tablet Take 1 tablet by mouth daily.  °  °Allergies:   Ace inhibitors, Amlodipine, Dust mite extract, and Pollen extract  ° °Social History  ° °Socioeconomic History  °• Marital status: Married  °  Spouse name: Not on file  °• Number of children: 1  °• Years of education: Not on file  °• Highest education level: Not on file  °Occupational History  °• Occupation: full time student for LPN  °Tobacco Use  °• Smoking status: Never Smoker  °• Smokeless tobacco: Never Used  °Vaping Use  °• Vaping Use: Never used  °Substance and Sexual Activity  °• Alcohol use: No  °• Drug use: No  °• Sexual activity: Yes  °Other Topics Concern  °• Not on file  °Social History Narrative  °• Not on file  ° °Social Determinants of Health  ° °Financial Resource Strain:   °• Difficulty of Paying Living Expenses: Not on file  °Food Insecurity:   °• Worried About Running Out of Food in the Last Year: Not on file  °• Ran Out of Food in the Last Year: Not on file  °Transportation Needs:   °•

## 2020-10-15 NOTE — Assessment & Plan Note (Signed)
Updated lab needed at/ before next visit.   

## 2020-10-15 NOTE — Telephone Encounter (Signed)
I note that pt went to Cardiology today, please copy her note to be able to work. Please encourage her , tell her very important to take ALL BP meds as prescribed including the new additional spironolactone Stamp with my sig and fax in that she may go to work Please leave a copy in my box, so that I may sign this next week also

## 2020-10-15 NOTE — Patient Instructions (Signed)
Medication Instructions:  Your physician has recommended you make the following change in your medication:  1-START spirolactone 25 mg by mouth daily 2-START Atorvastatin  20 mg by mouth daily  *If you need a refill on your cardiac medications before your next appointment, please call your pharmacy*  Lab Work: Your physician recommends that you return for lab work in:  7 to 10 days for BMET and Mg level  If you have labs (blood work) drawn today and your tests are completely normal, you will receive your results only by: Marland Kitchen MyChart Message (if you have MyChart) OR . A paper copy in the mail If you have any lab test that is abnormal or we need to change your treatment, we will call you to review the results.  Testing/Procedures: Your physician has recommended that you have a sleep study. This test records several body functions during sleep, including: brain activity, eye movement, oxygen and carbon dioxide blood levels, heart rate and rhythm, breathing rate and rhythm, the flow of air through your mouth and nose, snoring, body muscle movements, and chest and belly movement.  Follow-Up: At [facility], you and your health needs are our priority.  As part of our continuing mission to provide you with exceptional heart care, we have created designated Provider Care Teams.  These Care Teams include your primary Cardiologist (physician) and Advanced Practice Providers (APPs -  Physician Assistants and Nurse Practitioners) who all work together to provide you with the care you need, when you need it.  We recommend signing up for the patient portal called "MyChart".  Sign up information is provided on this After Visit Summary.  MyChart is used to connect with patients for Virtual Visits (Telemedicine).  Patients are able to view lab/test results, encounter notes, upcoming appointments, etc.  Non-urgent messages can be sent to your provider as well.   To learn more about what you can do with MyChart,  go to [email or web address].    Your next appointment:   3 month(s)  The format for your next appointment:   In Person  Provider:   You may see Werner Lean, MD or one of the following Advanced Practice Providers on your designated Care Team:    Melina Copa, PA-C  Ermalinda Barrios, PA-C  You have been referred to Hypertension Clinic to be seen in the next couple of weeks. You have been referred to Dr. Radford Pax for sleep apnea after sleep study is complete.

## 2020-10-15 NOTE — Assessment & Plan Note (Signed)
  Patient re-educated about  the importance of commitment to a  minimum of 150 minutes of exercise per week as able.  The importance of healthy food choices with portion control discussed, as well as eating regularly and within a 12 hour window most days. The need to choose "clean , green" food 50 to 75% of the time is discussed, as well as to make water the primary drink and set a goal of 64 ounces water daily.    Weight /BMI 10/15/2020 10/12/2020 09/25/2019  WEIGHT 221 lb 224 lb 240 lb  HEIGHT 5\' 5"  5\' 5"  5\' 5"   BMI 36.78 kg/m2 37.28 kg/m2 39.94 kg/m2  Improved with lifestyle change, she is applauded on this

## 2020-10-15 NOTE — Assessment & Plan Note (Signed)
Snoring, uncontrolled hypertension needs evaluation and treatment. Again re educated Tracy Joseph of the importance of having this addressed to protect heart, lungs and brain

## 2020-10-15 NOTE — Progress Notes (Signed)
Tracy Joseph     MRN: [id number]      DOB: 1971-05-11   HPI Ms. Tracy Joseph is here for follow up and re-evaluation of chronic medical conditions, medication management and review of any available recent lab and radiology data.  Preventive health is updated, specifically  Cancer screening and Immunization.   The PT denies any adverse reactions to current medications since the last visit.  C/o increased personal stress, not suicidal or homicidal and getting therapy C/o intermittent headaches and has not been taking blood pressure medications as prescribed Out of work and is to start a new job for which she needs a form completed to verify her faintness to work, needs to work and wants to. The job is sedentary ROS Denies recent fever or chills. Denies sinus pressure, nasal congestion, ear pain or sore throat. Denies chest congestion, productive cough or wheezing. Denies chest pains, palpitations and leg swelling Denies abdominal pain, nausea, vomiting,diarrhea or constipation.   Denies dysuria, frequency, hesitancy or incontinence. Denies joint pain, swelling and limitation in mobility. Denies  seizures, numbness, or tingling. Denies skin break down or rash.   PE  BP (!) 202/139   Pulse 92   Resp 16   Ht 5\' 5"  (1.651 m)   Wt 224 lb (101.6 kg)   SpO2 96%   BMI 37.28 kg/m   Patient alert and oriented  HEENT: No facial asymmetry, EOMI,     Neck supple .  Chest: Clear to auscultation bilaterally.  CVS: S1, S2 no murmurs, no S3.Regular rate.  ABD: Soft non tender.   Ext: No edema  MS: Adequate ROM spine, shoulders, hips and knees.  Skin: fungal infection between 4th and 5th right toes Psych: Good eye contact, normal affect. Memory intact not  depressed appearing.  CNS: CN 2-12 intact, power,  normal throughout.no focal deficits noted.   Assessment & Plan  Malignant hypertension severe uncontrolled hypertension , with recent headaches, Medication compliance is  stressed. She is also referred to Cardiology Office EKG; NSR with lateral ischemia and septal infarct  Obstructive sleep apnea Snoring, uncontrolled hypertension needs evaluation and treatment. Again re educated Ms Tracy Joseph of the importance of having this addressed to protect heart, lungs and brain  Type 2 diabetes mellitus with vascular disease ([place]) Ms. Tracy Joseph is reminded of the importance of commitment to daily physical activity for 30 minutes or more, as able and the need to limit carbohydrate intake to 30 to 60 grams per meal to help with blood sugar control.  Improved with diet  And exercise , she is applauded on this The need to take medication as prescribed, test blood sugar as directed, and to call between visits if there is a concern that blood sugar is uncontrolled is also discussed.   Ms. Tracy Joseph is reminded of the importance of daily foot exam, annual eye examination, and good blood sugar, blood pressure and cholesterol control. Improved  Diabetic Labs Latest Ref Rng & Units 10/12/2020 09/16/2019 04/29/2019 01/24/2017 06/14/2016  HbA1c 4.0 - 5.6 % 6.1(A) 7.2(H) 7.0(H) 6.0(H) 5.9(H)  Chol <200 mg/dL - 213(H) 208(H) 199 190  HDL > OR = 50 mg/dL - 45(L) 45(L) 53 45(L)  Calc LDL mg/dL (calc) - 136(H) 133(H) 123(H) 113  Triglycerides <150 mg/dL - 183(H) 167(H) 115 160(H)  Creatinine 0.50 - 1.10 mg/dL - 1.14(H) 1.08 1.11(H) 1.02   BP/Weight 10/15/2020 10/12/2020 09/25/2019 09/16/2019 08/26/2019 07/08/2019 9/98/3382  Systolic BP 505 397 673 419 379 024 097  Diastolic BP 353  139 100 112 110 110 100  Wt. (Lbs) 221 224 240 236 239 239 235  BMI 36.78 37.28 39.94 39.27 39.77 39.77 39.11   Foot/eye exam completion dates 05/05/2019  Foot Form Completion Done         Hypothyroidism following radioiodine therapy Updated lab needed at/ before next visit.   Morbid obesity ([place])  Patient re-educated about  the importance of commitment to a  minimum of 150 minutes of exercise per week  as able.  The importance of healthy food choices with portion control discussed, as well as eating regularly and within a 12 hour window most days. The need to choose "clean , green" food 50 to 75% of the time is discussed, as well as to make water the primary drink and set a goal of 64 ounces water daily.    Weight /BMI 10/15/2020 10/12/2020 09/25/2019  WEIGHT 221 lb 224 lb 240 lb  HEIGHT 5\' 5"  5\' 5"  5\' 5"   BMI 36.78 kg/m2 37.28 kg/m2 39.94 kg/m2  Improved with lifestyle change, she is applauded on this

## 2020-10-15 NOTE — Assessment & Plan Note (Signed)
severe uncontrolled hypertension , with recent headaches, Medication compliance is stressed. She is also referred to Cardiology Office EKG; NSR with lateral ischemia and septal infarct

## 2020-10-16 NOTE — Telephone Encounter (Signed)
Thank you :)

## 2020-10-22 ENCOUNTER — Other Ambulatory Visit: Payer: Self-pay | Admitting: *Deleted

## 2020-10-22 ENCOUNTER — Other Ambulatory Visit: Payer: Self-pay

## 2020-10-22 DIAGNOSIS — G4733 Obstructive sleep apnea (adult) (pediatric): Secondary | ICD-10-CM

## 2020-10-22 DIAGNOSIS — E119 Type 2 diabetes mellitus without complications: Secondary | ICD-10-CM

## 2020-10-22 DIAGNOSIS — I160 Hypertensive urgency: Secondary | ICD-10-CM

## 2020-10-22 DIAGNOSIS — E7849 Other hyperlipidemia: Secondary | ICD-10-CM

## 2020-10-22 DIAGNOSIS — I10 Essential (primary) hypertension: Secondary | ICD-10-CM

## 2020-10-22 LAB — BASIC METABOLIC PANEL
BUN/Creatinine Ratio: 14 (ref 9–23)
BUN: 21 mg/dL (ref 6–24)
CO2: 26 mmol/L (ref 20–29)
Calcium: 10 mg/dL (ref 8.7–10.2)
Chloride: 95 mmol/L — ABNORMAL LOW (ref 96–106)
Creatinine, Ser: 1.54 mg/dL — ABNORMAL HIGH (ref 0.57–1.00)
GFR calc Af Amer: 45 mL/min/{1.73_m2} — ABNORMAL LOW (ref >59)
GFR calc non Af Amer: 39 mL/min/{1.73_m2} — ABNORMAL LOW (ref >59)
Glucose: 110 mg/dL — ABNORMAL HIGH (ref 65–99)
Potassium: 3.7 mmol/L (ref 3.5–5.2)
Sodium: 135 mmol/L (ref 134–144)

## 2020-10-22 LAB — MAGNESIUM: Magnesium: 2 mg/dL (ref 1.6–2.3)

## 2020-10-25 ENCOUNTER — Telehealth: Payer: Self-pay | Admitting: Internal Medicine

## 2020-10-25 MED ORDER — ISOSORBIDE DINITRATE 20 MG PO TABS: 20.0000 mg | ORAL_TABLET | Freq: Three times a day (TID) | ORAL | 11 refills | Status: DC

## 2020-10-25 NOTE — Telephone Encounter (Signed)
Pt aware of new recommendations and verbalizes understanding ./cy

## 2020-10-25 NOTE — Telephone Encounter (Signed)
Patient returning call in regards to lab results.

## 2020-10-25 NOTE — Telephone Encounter (Signed)
Per pt has not started the Spironolactone so the lab values are not showing elevation due to med start. Will Forward to Dr [name] for review and recommendations .Pt still as of today has not started as refill was not ready for pick /cy

## 2020-10-25 NOTE — Telephone Encounter (Signed)
   DM 2  Tracy Joseph Female, 49 y.o., 08-20-1971 MRN:  [id number] Phone:  [phone] [name]) PCP:  [name], MD Primary Cvg:  None Next Appt With Cardiology ([facility]) 11/01/2020 at 8:30 AM Message Received: Today Werner Lean, MD  Richmond Campbell, LPN We can put her on isosorbide dinitrate 20 mg TID. The Pharmacy clinic will be useful because we need to make sure she is actually taking what we think she is taking.

## 2020-10-25 NOTE — Telephone Encounter (Signed)
This is actually more worrisome. This may represent worsening of this patients disease.  HTN Kidney disease.  Will stop spirnolactone Will start labetalol 100 mg BID. Will refer to pharmacy clinic for BP assistance; would get repeat BMP at that visit Would benefit from case management- no insurance, with multiple comorbidities.  Werner Lean, MD

## 2020-10-25 NOTE — Addendum Note (Signed)
Addended by: Devra Dopp E on: 10/25/2020 02:34 PM   Modules accepted: Orders

## 2020-10-25 NOTE — Telephone Encounter (Signed)
Results:  Increase in creatinine on aldactone  Plan:  Given multiple medications allergies will check BMP with decreased dose 12.5 mg and will refer to pharmacy for extra assistance.

## 2020-10-25 NOTE — Telephone Encounter (Signed)
Per pt is already taking Labetalol 300 mg bid .

## 2020-11-01 ENCOUNTER — Ambulatory Visit: Payer: Self-pay

## 2020-11-10 ENCOUNTER — Ambulatory Visit (INDEPENDENT_AMBULATORY_CARE_PROVIDER_SITE_OTHER): Payer: Self-pay | Admitting: Pharmacist

## 2020-11-10 ENCOUNTER — Telehealth: Payer: Self-pay | Admitting: Licensed Clinical Social Worker

## 2020-11-10 ENCOUNTER — Other Ambulatory Visit: Payer: Self-pay

## 2020-11-10 VITALS — BP 200/130 | HR 80

## 2020-11-10 DIAGNOSIS — I10 Essential (primary) hypertension: Secondary | ICD-10-CM

## 2020-11-10 DIAGNOSIS — I160 Hypertensive urgency: Secondary | ICD-10-CM

## 2020-11-10 LAB — BASIC METABOLIC PANEL
BUN/Creatinine Ratio: 15 (ref 9–23)
BUN: 20 mg/dL (ref 6–24)
CO2: 25 mmol/L (ref 20–29)
Calcium: 9.6 mg/dL (ref 8.7–10.2)
Chloride: 102 mmol/L (ref 96–106)
Creatinine, Ser: 1.34 mg/dL — ABNORMAL HIGH (ref 0.57–1.00)
GFR calc Af Amer: 53 mL/min/{1.73_m2} — ABNORMAL LOW (ref >59)
GFR calc non Af Amer: 46 mL/min/{1.73_m2} — ABNORMAL LOW (ref >59)
Glucose: 97 mg/dL (ref 65–99)
Potassium: 4 mmol/L (ref 3.5–5.2)
Sodium: 139 mmol/L (ref 134–144)

## 2020-11-10 NOTE — Telephone Encounter (Signed)
Care Navigation team received referral to f/u w/ pt for supportive resources. CSW left HIPAA compliant message for pt on cell at [phone].   Westley Hummer, MSW, [place] Work

## 2020-11-10 NOTE — Progress Notes (Signed)
Patient ID: Tracy Joseph                 DOB: [birth year]                      MRN: [id number]     HPI: Tracy Joseph is a 49 y.o. female referred by Dr. [name] to HTN clinic. PMH is significant for T2DM, HTN, OSA, hypothyroidism, HLD, obesity, and metabolic syndrome.    Patient seen by Dr [name] on 10/29 and BP elevated at 205/135.  Patient was prescribed spironolactone.  She had not started it yet and then lab work returned showing decreased kidney function.  [name] was changed to isosorbide 20mg  TID along with her hydralazine 100 BID, labetalol 300mg  BID, and triamterene./HCTZ 75/50 once daily.  Patient presents today for first visit in HTN clinic. Is not checking her BP at home although she does have a cuff.  Has not started her isosorbide yet because she read that it might make her dizzy.  Recently started a new job but still does not have insurance coverage.  Is having relationship issues with husband and is under stress. Patient occasionally tearful in appointment.   Has made diet changes in past year and has started going to the gym twice weekly.  Has lost weight and blood sugar has become better controlled but she does not check that at home due to not having a meter.  Also has diagnosis of sleep apnea but does not have a CPAP and has not had a sleep study.  Upon questioning patient reports she sleeps about 1 to 2 hours at a time and then wakes up and this has been occurring for many years.     Patient does not typically eat breakfast in the morning and therefore does not take medications in the morning because she reports they make her feel nauseous.  Usually takes meds around lunchtime.  Has intolerance of ACEi listed on allergy list, however do not see any ace inhibitors listed in past medications.  Has Benicar/HCT listed but patient does not know why it was discontinued.    Blood pressure today in room 200/130, patient reports no chest pain or SOB  Current HTN meds:  hydralazine 100mg  BID, isosorbide 20 mg TID, labetalol 300mg  BID, triamterene/hctz 75/50 daily Previously tried: amlodipine (edema), ACEi (cough)?, Benicar/HCTZ 40/25 (unclear why d/c) BP goal: <130/80  Family History: mother has HTN  Social History: no alcohol, no tobacco  Diet: has increased vegetables, drinks water, green tea, 1 can of regular soda, fruits, chicken, steak, spinach,   Exercise: going to the gym 2-3 times a week  Home BP readings: not checking  Wt Readings from Last 3 Encounters:  10/15/20 221 lb (100.2 kg)  10/12/20 224 lb (101.6 kg)  09/25/19 240 lb (108.9 kg)   BP Readings from Last 3 Encounters:  10/15/20 (!) 205/135  10/12/20 (!) 202/139  09/25/19 (!) 160/100   Pulse Readings from Last 3 Encounters:  10/15/20 93  10/12/20 92  09/25/19 88    Renal function: CrCl cannot be calculated (Unknown ideal weight.).  Past Medical History:  Diagnosis Date   Essential hypertension    Goiter    Initially hyperthyroid treated with RAI, now hypothyroid - follows with Dr. [name]   Hypertension    Phreesia 10/12/2020   Obstructive sleep apnea    Suspected    Prediabetes    Seasonal allergies    Tubular adenoma of colon  Current Outpatient Medications on File Prior to Visit  Medication Sig Dispense Refill   acetaminophen (TYLENOL) 500 MG tablet Take 1,000 mg by mouth daily as needed for headache.     atorvastatin (LIPITOR) 20 MG tablet Take 1 tablet (20 mg total) by mouth daily. 90 tablet 3   cetirizine (ZYRTEC) 10 MG tablet Take 10 mg by mouth daily as needed for allergies.     hydrALAZINE (APRESOLINE) 100 MG tablet Take 1 tablet (100 mg total) by mouth 2 (two) times daily. 270 tablet 1   isosorbide dinitrate (ISORDIL) 20 MG tablet Take 1 tablet (20 mg total) by mouth 3 (three) times daily. 90 tablet 11   labetalol (NORMODYNE) 300 MG tablet Take 1 tablet (300 mg total) by mouth 2 (two) times daily. 60 tablet 3   levothyroxine (SYNTHROID)  175 MCG tablet TAKE ONE AND A HALF TABLETS EVERY MONDAY, WEDNESDAY AND FRIDAY, BY MOUTH. TAKE ONE TABLET ONCE DAILY EVERY TUESDAY, THURSDAY, SATURDAY AND SUNDAY 108 tablet 1   sodium chloride (OCEAN) 0.65 % SOLN nasal spray Place 1 spray into both nostrils as needed for congestion.     triamterene-hydrochlorothiazide (MAXZIDE) 75-50 MG tablet Take 1 tablet by mouth daily. 90 tablet 1   No current facility-administered medications on file prior to visit.    Allergies  Allergen Reactions   Ace Inhibitors Cough   Amlodipine Swelling   Dust Mite Extract    Pollen Extract      Assessment/Plan:  1. Hypertension - Patient BP today 200/130 which is significantly above goal of <130/80. Patient has many underlying conditions which may be exacerbating HTN including sleep apnea, diabetes, and stress.  Also has not taken medications today and has not started isosorbide.  Educated patient on link between sleep apnea and HTN and gave handouts.  Patient reports she was not aware that OSA could impact her blood pressure. Will send message Dr [name] about possible sleep study referral.  Concern about recently decreased kidney function especially in setting of DM, however A1c has recently decreased. Unclear what patient's home BG values are since she does not test.  Would prefer patient be on ACEi/ARB but has intolerance listed to ace inhibitors, unclear why olmesartan was discontinued. Do not see a recent urine microalbumin result in labs. Hesitant to restart ARB medication without rechecking BMP.  Will place order.  Patient having emotional stress at home as well which could be negatively impacting blood pressure.  Reports separation from husband and recent unemployment. Recommended counseling and patient is agreeable.  Recommended patient start isosorbide and to take medications as prescribed.  Recommended she begin checking her BP at home and follow up with me in 1 week with BP values.     Start isosorbide 20 mg TID Continue labetalol 300mg  BID Continue hydralazine 100mg  BID Continue triamterene/HCTZ 75/50 once daily Recheck BMP  [name], PharmD, BCACP, CDCES, [place] [address] [place], [place] [address] Phone: [phone]; Fax: [phone] 11/10/2020 10:43 AM

## 2020-11-10 NOTE — Patient Instructions (Addendum)
It was nice meeting you today  We would like your blood pressure to be less than 130/80  Please start your isosorbide and continue your hydralazine twice daily, labetalol twice daily, and your triamterene/hydrochlorothiazide once a day  I will look into getting you a referral for your sleep apnea and a counselor  I want to recheck your lab work to see how your kidneys are working  [name], PharmD, BCACP, [place], [place] [place], [place] [address] Phone: [phone]; Fax: [phone] 11/10/2020 9:33 AM

## 2020-11-15 ENCOUNTER — Telehealth: Payer: Self-pay | Admitting: Licensed Clinical Social Worker

## 2020-11-15 NOTE — Telephone Encounter (Signed)
CSW attempted to reach pt again this morning. HIPAA compliant message left for pt at [phone] to see if CSW can be of assistance to connect pt to community resources.   Westley Hummer, MSW, [place] [employer]

## 2020-11-22 ENCOUNTER — Telehealth: Payer: Self-pay | Admitting: Licensed Clinical Social Worker

## 2020-11-22 ENCOUNTER — Ambulatory Visit: Payer: Self-pay | Admitting: Family Medicine

## 2020-11-22 NOTE — Telephone Encounter (Signed)
CSW called pt again at [phone]. No answer, third HIPAA compliant message left for pt.   Westley Hummer, MSW, [place]  [phone]

## 2020-12-01 ENCOUNTER — Ambulatory Visit (INDEPENDENT_AMBULATORY_CARE_PROVIDER_SITE_OTHER): Payer: Self-pay | Admitting: Family Medicine

## 2020-12-01 ENCOUNTER — Other Ambulatory Visit: Payer: Self-pay

## 2020-12-01 ENCOUNTER — Encounter: Payer: Self-pay | Admitting: Family Medicine

## 2020-12-01 VITALS — BP 206/133 | HR 85 | Resp 16 | Ht 65.0 in | Wt 218.0 lb

## 2020-12-01 DIAGNOSIS — E7849 Other hyperlipidemia: Secondary | ICD-10-CM

## 2020-12-01 DIAGNOSIS — I10 Essential (primary) hypertension: Secondary | ICD-10-CM

## 2020-12-01 DIAGNOSIS — B353 Tinea pedis: Secondary | ICD-10-CM

## 2020-12-01 DIAGNOSIS — Z1231 Encounter for screening mammogram for malignant neoplasm of breast: Secondary | ICD-10-CM

## 2020-12-01 DIAGNOSIS — E1159 Type 2 diabetes mellitus with other circulatory complications: Secondary | ICD-10-CM

## 2020-12-01 DIAGNOSIS — Z1159 Encounter for screening for other viral diseases: Secondary | ICD-10-CM

## 2020-12-01 MED ORDER — TRIAMTERENE-HCTZ 75-50 MG PO TABS: 1.0000 | ORAL_TABLET | Freq: Every day | ORAL | 3 refills | Status: DC

## 2020-12-01 NOTE — Patient Instructions (Signed)
F/u in office with mD mid February, call if you need me sooner  Nurse visit for pneumonia 23 in January  Please schedule mammogram in [place] at checkout  Hepatitis c screen to b e added to fasting labs that you will get in [place].  PLEASE organize and take meds as prescribed, isosorbide is 6am, 2pm and 10 pm  The twice daily are 9am and 9 pm  Increase maxzide to higher dose 75/50 ONE daily, stoop the lower dose   Use antifungal spray between toes and start wearing socks!  It is important that you exercise regularly at least 30 minutes 5 times a week. If you develop chest pain, have severe difficulty breathing, or feel very tired, stop exercising immediately and seek medical attention  Think about what you will eat, plan ahead. Choose " clean, green, fresh or frozen" over canned, processed or packaged foods which are more sugary, salty and fatty. 70 to 75% of food eaten should be vegetables and fruit. Three meals at set times with snacks allowed between meals, but they must be fruit or vegetables. Aim to eat over a 12 hour period , example 7 am to 7 pm, and STOP after  your last meal of the day. Drink water,generally about 64 ounces per day, no other drink is as healthy. Fruit juice is best enjoyed in a healthy way, by EATING the fruit. Thanks for choosing [facility], we consider it a privelige to serve you.

## 2020-12-04 ENCOUNTER — Encounter: Payer: Self-pay | Admitting: Family Medicine

## 2020-12-04 DIAGNOSIS — B353 Tinea pedis: Secondary | ICD-10-CM | POA: Insufficient documentation

## 2020-12-04 NOTE — Assessment & Plan Note (Signed)
Ms. Tracy Joseph is reminded of the importance of commitment to daily physical activity for 30 minutes or more, as able and the need to limit carbohydrate intake to 30 to 60 grams per meal to help with blood sugar control.   Ms. Tracy Joseph is reminded of the importance of daily foot exam, annual eye examination, and good blood sugar, blood pressure and cholesterol control.  Diabetic Labs Latest Ref Rng & Units 11/10/2020 10/22/2020 10/12/2020 09/16/2019 04/29/2019  HbA1c 4.0 - 5.6 % - - 6.1(A) 7.2(H) 7.0(H)  Chol <200 mg/dL - - - 213(H) 208(H)  HDL > OR = 50 mg/dL - - - 45(L) 45(L)  Calc LDL mg/dL (calc) - - - 136(H) 133(H)  Triglycerides <150 mg/dL - - - 183(H) 167(H)  Creatinine 0.57 - 1.00 mg/dL 1.34(H) 1.54(H) - 1.14(H) 1.08   BP/Weight 12/01/2020 11/10/2020 10/15/2020 10/12/2020 09/25/2019 6/00/4599 06/23/4141  Systolic BP 395 320 233 435 686 168 372  Diastolic BP 902 111 552 080 100 112 110  Wt. (Lbs) 218 - 221 224 240 236 239  BMI 36.28 - 36.78 37.28 39.94 39.27 39.77   Foot/eye exam completion dates 12/01/2020 05/05/2019  Foot Form Completion Done Done

## 2020-12-04 NOTE — Assessment & Plan Note (Signed)
Topical antifungal recommended

## 2020-12-04 NOTE — Assessment & Plan Note (Signed)
  Patient re-educated about  the importance of commitment to a  minimum of 150 minutes of exercise per week as able.  The importance of healthy food choices with portion control discussed, as well as eating regularly and within a 12 hour window most days. The need to choose "clean , green" food 50 to 75% of the time is discussed, as well as to make water the primary drink and set a goal of 64 ounces water daily.    Weight /BMI 12/01/2020 10/15/2020 10/12/2020  WEIGHT 218 lb 221 lb 224 lb  HEIGHT 5\' 5"  5\' 5"  5\' 5"   BMI 36.28 kg/m2 36.78 kg/m2 37.28 kg/m2    Obesity linked with HTN and diabetes Improving

## 2020-12-04 NOTE — Assessment & Plan Note (Signed)
Unconrolled, increase maxzide and pt to take meds on schedule DASH diet and commitment to daily physical activity for a minimum of 30 minutes discussed and encouraged, as a part of hypertension management. The importance of attaining a healthy weight is also discussed.  BP/Weight 12/01/2020 11/10/2020 10/15/2020 10/12/2020 09/25/2019 5/46/5681 01/24/5169  Systolic BP 017 494 496 759 163 846 659  Diastolic BP 935 701 779 390 100 112 110  Wt. (Lbs) 218 - 221 224 240 236 239  BMI 36.28 - 36.78 37.28 39.94 39.27 39.77

## 2020-12-04 NOTE — Progress Notes (Signed)
Tracy Joseph     MRN: [id number]      DOB: 09/21/1971   HPI Tracy Joseph is here for follow up and re-evaluation of chronic medical conditions, medication management and review of any available recent lab and radiology data.  Preventive health is updated, specifically  Cancer screening and Immunization.   Questions or concerns regarding consultations or procedures which the PT has had in the interim are  addressed. The PT denies any adverse reactions to current medications since the last visit.  There are no new concerns.  There are no specific complaints   ROS Denies recent fever or chills. Denies sinus pressure, nasal congestion, ear pain or sore throat. Denies chest congestion, productive cough or wheezing. Denies chest pains, palpitations and leg swelling Denies abdominal pain, nausea, vomiting,diarrhea or constipation.   Denies dysuria, frequency, hesitancy or incontinence. Denies joint pain, swelling and limitation in mobility. Denies headaches, seizures, numbness, or tingling. Denies depression, anxiety or insomnia. Denies skin break down or rash.   PE  BP (!) 206/133   Pulse 85   Resp 16   Ht 5\' 5"  (1.651 m)   Wt 218 lb (98.9 kg)   SpO2 98%   BMI 36.28 kg/m   Patient alert and oriented and in no cardiopulmonary distress.  HEENT: No facial asymmetry, EOMI,     Neck supple .  Chest: Clear to auscultation bilaterally.  CVS: S1, S2 no murmurs, no S3.Regular rate.  ABD: Soft non tender.   Ext: No edema  MS: Adequate ROM spine, shoulders, hips and knees.  Skin:tinea pedis bilaterally  Psych: Good eye contact, normal affect. Memory intact not anxious or depressed appearing.  CNS: CN 2-12 intact, power,  normal throughout.no focal deficits noted.   Assessment & Plan  Malignant hypertension Unconrolled, increase maxzide and pt to take meds on schedule DASH diet and commitment to daily physical activity for a minimum of 30 minutes discussed and  encouraged, as a part of hypertension management. The importance of attaining a healthy weight is also discussed.  BP/Weight 12/01/2020 11/10/2020 10/15/2020 10/12/2020 09/25/2019 2/62/0355 08/24/4162  Systolic BP 845 364 680 321 224 825 003  Diastolic BP 704 888 916 945 100 112 110  Wt. (Lbs) 218 - 221 224 240 236 239  BMI 36.28 - 36.78 37.28 39.94 39.27 39.77       Type 2 diabetes mellitus with vascular disease ([place]) Tracy Joseph is reminded of the importance of commitment to daily physical activity for 30 minutes or more, as able and the need to limit carbohydrate intake to 30 to 60 grams per meal to help with blood sugar control.   Tracy Joseph is reminded of the importance of daily foot exam, annual eye examination, and good blood sugar, blood pressure and cholesterol control.  Diabetic Labs Latest Ref Rng & Units 11/10/2020 10/22/2020 10/12/2020 09/16/2019 04/29/2019  HbA1c 4.0 - 5.6 % - - 6.1(A) 7.2(H) 7.0(H)  Chol <200 mg/dL - - - 213(H) 208(H)  HDL > OR = 50 mg/dL - - - 45(L) 45(L)  Calc LDL mg/dL (calc) - - - 136(H) 133(H)  Triglycerides <150 mg/dL - - - 183(H) 167(H)  Creatinine 0.57 - 1.00 mg/dL 1.34(H) 1.54(H) - 1.14(H) 1.08   BP/Weight 12/01/2020 11/10/2020 10/15/2020 10/12/2020 09/25/2019 0/38/8828 0/0/3491  Systolic BP 791 505 697 948 016 553 748  Diastolic BP 270 786 754 492 100 112 110  Wt. (Lbs) 218 - 221 224 240 236 239  BMI 36.28 - 36.78 37.28 39.94  39.27 39.77   Foot/eye exam completion dates 12/01/2020 05/05/2019  Foot Form Completion Done Done        Other hyperlipidemia Hyperlipidemia:Low fat diet discussed and encouraged.   Lipid Panel  Lab Results  Component Value Date   CHOL 213 (H) 09/16/2019   HDL 45 (L) 09/16/2019   LDLCALC 136 (H) 09/16/2019   TRIG 183 (H) 09/16/2019   CHOLHDL 4.7 09/16/2019   Not at goal,  Updated lab needed at/ before next visit.     Morbid obesity ([place])  Patient re-educated about  the importance of commitment to  a  minimum of 150 minutes of exercise per week as able.  The importance of healthy food choices with portion control discussed, as well as eating regularly and within a 12 hour window most days. The need to choose "clean , green" food 50 to 75% of the time is discussed, as well as to make water the primary drink and set a goal of 64 ounces water daily.    Weight /BMI 12/01/2020 10/15/2020 10/12/2020  WEIGHT 218 lb 221 lb 224 lb  HEIGHT 5\' 5"  5\' 5"  5\' 5"   BMI 36.28 kg/m2 36.78 kg/m2 37.28 kg/m2    Obesity linked with HTN and diabetes Improving  Tinea pedis Topical antifungal recommended

## 2020-12-04 NOTE — Assessment & Plan Note (Signed)
Hyperlipidemia:Low fat diet discussed and encouraged.   Lipid Panel  Lab Results  Component Value Date   CHOL 213 (H) 09/16/2019   HDL 45 (L) 09/16/2019   LDLCALC 136 (H) 09/16/2019   TRIG 183 (H) 09/16/2019   CHOLHDL 4.7 09/16/2019   Not at goal,  Updated lab needed at/ before next visit.

## 2020-12-22 ENCOUNTER — Ambulatory Visit (INDEPENDENT_AMBULATORY_CARE_PROVIDER_SITE_OTHER): Payer: BC Managed Care – PPO

## 2020-12-22 ENCOUNTER — Other Ambulatory Visit: Payer: Self-pay

## 2020-12-22 DIAGNOSIS — Z23 Encounter for immunization: Secondary | ICD-10-CM

## 2020-12-23 ENCOUNTER — Ambulatory Visit (HOSPITAL_COMMUNITY)
Admission: RE | Admit: 2020-12-23 | Discharge: 2020-12-23 | Disposition: A | Discharge status: Home / Self Care | Payer: Self-pay | Source: Ambulatory Visit | Attending: Family Medicine | Admitting: Family Medicine

## 2020-12-23 DIAGNOSIS — Z1231 Encounter for screening mammogram for malignant neoplasm of breast: Secondary | ICD-10-CM | POA: Insufficient documentation

## 2021-01-06 ENCOUNTER — Ambulatory Visit (HOSPITAL_COMMUNITY)
Admission: RE | Admit: 2021-01-06 | Discharge: 2021-01-06 | Disposition: A | Discharge status: Home / Self Care | Payer: BC Managed Care – PPO | Source: Ambulatory Visit | Attending: Family Medicine | Admitting: Family Medicine

## 2021-01-06 ENCOUNTER — Other Ambulatory Visit: Payer: Self-pay

## 2021-01-06 DIAGNOSIS — Z1231 Encounter for screening mammogram for malignant neoplasm of breast: Secondary | ICD-10-CM | POA: Insufficient documentation

## 2021-01-17 ENCOUNTER — Ambulatory Visit: Payer: Self-pay | Admitting: Internal Medicine

## 2021-01-17 NOTE — Progress Notes (Deleted)
Cardiology Office Note:    Date:  01/17/2021   ID:  JARITZA DUIGNAN, DOB 1971/10/10, MRN [id number]  PCP:  [name], MD  [facility] Cardiologist:  Werner Lean, MD   LX:BWIOMB up HTN  History of Present Illness:    Tracy Joseph is a 50 y.o. female with a hx of Morbid Obesity, Diabetes with Hypertension, OSA  Needing sle speciaalist and HLD who presented for evaluation 10/15/20.  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  Social work has left multiple messages for follow up for assistance (November, December)  Patient notes that (s)he is doing ***.  Since day prior/last visit notes *** changes.  Relevant interval testing or therapy include ***.  There are no*** interval hospital/ED visit.    No chest pain or pressure ***.  No SOB/DOE*** and no PND/Orthopnea***.  No weight gain or leg swelling***.  No palpitations or syncope ***.  Ambulatory blood pressure ***.    Past Medical History:  Diagnosis Date  . Essential hypertension   . Goiter    Initially hyperthyroid treated with RAI, now hypothyroid - follows with Dr. [name]  . Hypertension    Phreesia 10/12/2020  . Obstructive sleep apnea    Suspected   . Prediabetes   . Seasonal allergies   . Tubular adenoma of colon    Past Surgical History:  Procedure Laterality Date  . COLONOSCOPY N/A 05/03/2017   Procedure: COLONOSCOPY;  Surgeon: Rogene Houston, MD;  Location: [facility];  Service: Endoscopy;  Laterality: N/A;  930  . TUBAL LIGATION  2005   Current Medications: No outpatient medications have been marked as taking for the 01/17/21 encounter (Appointment) with Werner Lean, MD.    Allergies:   Ace inhibitors, Amlodipine, Dust mite extract, and Pollen extract   Social History   Socioeconomic History  . Marital status: Married    Spouse name: Not on file  . Number  of children: 1  . Years of education: Not on file  . Highest education level: Not on file  Occupational History  . Occupation: full Immunologist for [employer]  Tobacco Use  . Smoking status: Never Smoker  . Smokeless tobacco: Never Used  Vaping Use  . Vaping Use: Never used  Substance and Sexual Activity  . Alcohol use: No  . Drug use: No  . Sexual activity: Yes  Other Topics Concern  . Not on file  Social History Narrative  . Not on file   Social Determinants of Health   Financial Resource Strain: Not on file  Food Insecurity: Not on file  Transportation Needs: Not on file  Physical Activity: Not on file  Stress: Not on file  Social Connections: Not on file    Family History: The patient's family history includes Coronary artery disease in her maternal aunt and maternal aunt; Hyperlipidemia in her mother; Hypertension in her mother; Thyroid disease in her paternal grandmother.  ROS:   Please see the history of present illness.    All other systems reviewed and are negative.  EKGs/Labs/Other Studies Reviewed:    The following studies were reviewed today:  EKG:   10/12/20- SR 84 lateral TWI; septal infarct criteria not met  Recent Lipid Panel    Component Value Date/Time   CHOL 213 (H) 09/16/2019 0847   TRIG 183 (H) 09/16/2019 0847   HDL  45 (L) 09/16/2019 0847   CHOLHDL 4.7 09/16/2019 0847   VLDL 23 01/24/2017 0916   LDLCALC 136 (H) 09/16/2019 0847   08/29/2004 Echo Report Only LEFT VENTRICLE:  - Left ventricular size was normal.  - Overall left ventricular systolic function was normal.  - Left ventricular ejection fraction was estimated , range being 55     % to 65 %..  - There were no left ventricular regional wall motion     abnormalities.  - Left ventricular wall thickness was moderately increased.  AORTIC VALVE:  - The aortic valve was not well visualized.  Doppler interpretation(s):  - There was no evidence for aortic valve  stenosis.  - There was no significant aortic valvular regurgitation.  AORTA:  - The aortic root was normal in size.  MITRAL VALVE:  - There was mild thickening of the mitral valve.  Doppler interpretation(s):  - There was trivial mitral valvular regurgitation.  LEFT ATRIUM:  - The left atrium was mildly dilated.  RIGHT VENTRICLE:  - The right ventricle was not well visualized.  - Right ventricular size was normal.  TRICUSPID VALVE:  Doppler interpretation(s):  - There was trivial tricuspid valvular regurgitation.  RIGHT ATRIUM:  - The right atrium was not well visualized.  - Right atrial size was normal.  PERICARDIUM:  - There was a minimal pericardial effusion, without hemodynamic     compromise.   Physical Exam:    VS:  There were no vitals taken for this visit.    Wt Readings from Last 3 Encounters:  12/01/20 218 lb (98.9 kg)  10/15/20 221 lb (100.2 kg)  10/12/20 224 lb (101.6 kg)   Repeat BP 210/120   GEN: Morbidly Obese, well developed in no acute distress HEENT: Normal NECK: No JVD; No carotid bruits LYMPHATICS: No lymphadenopathy CARDIAC: RRR, no murmurs, rubs, gallops RESPIRATORY:  Clear to auscultation without rales, wheezing or rhonchi  ABDOMEN: Soft, non-tender, non-distended MUSCULOSKELETAL:  No edema; No deformity  SKIN: Warm and dry NEUROLOGIC:  Alert and oriented x 3 PSYCHIATRIC:  Normal affect   ASSESSMENT:    No diagnosis found. PLAN:    In order of problems listed above:  OSA needing CPAP Morbid Obesity/DM/HTN - ambulatory blood pressure ***, will start/continue ambulatory BP monitoring; gave education on how to perform ambulatory blood pressure monitoring including the frequency and technique; goal ambulatory blood pressure < 135/85 on average - continue home medications with the exception of *** addition of olmesartan 20 mg Po Daily - will get labs in 7-10 days (BMET) - Will refer to sleep clinic and for  repeat sleep study - concern for secondary HTN, will order renal artery duplex, and Aldo/renin***  - Arm/Leg BP Differential < 20 mm Hg; not suggestive of coaracation; Arm BP differential < 15 mm Hg not suggestive of subclavian stenosis - discussed diet (DASH/low sodium), and exercise/weight loss interventions ***  Hyperlipidemia (mixed) -LDL goal less than 100 - Fasting triglycerides notable for -continue current statin - Zetia vs Repatha*** - Bempedoic acid 180 mg PO daily *** tendon rupture - Vascepa*** - low threshold to send to lipid clinic for additional patient support *** - would stop niacin as it can raise blood sugars and increase uric acid*** - would stop OTC fish oil has little cardiovascular protection, is not regulated by FDA as it is actually a "supplement" and can continue incorrect amounts of DHA/EPA or harmful fats*** -Recheck lipid profile LFTs - gave education on dietary changes    Three  month follow up unless new symptoms or abnormal test results warranting change in plan  Would be reasonable for *** Video Visit Follow up  Would be reasonable for *** APP Follow up    Medication Adjustments/Labs and Tests Ordered: Current medicines are reviewed at length with the patient today.  Concerns regarding medicines are outlined above.  No orders of the defined types were placed in this encounter.  No orders of the defined types were placed in this encounter.   There are no Patient Instructions on file for this visit.   Signed, Werner Lean, MD  01/17/2021 7:24 AM    [place]

## 2021-01-18 ENCOUNTER — Other Ambulatory Visit: Payer: Self-pay | Admitting: Family Medicine

## 2021-01-21 ENCOUNTER — Other Ambulatory Visit: Payer: Self-pay | Admitting: Family Medicine

## 2021-02-01 ENCOUNTER — Ambulatory Visit: Payer: Self-pay | Admitting: Family Medicine

## 2021-02-02 ENCOUNTER — Other Ambulatory Visit: Payer: Self-pay | Admitting: Family Medicine

## 2021-03-29 LAB — HM DIABETES EYE EXAM

## 2021-04-14 ENCOUNTER — Ambulatory Visit: Payer: BC Managed Care – PPO | Admitting: Family Medicine

## 2021-04-26 ENCOUNTER — Encounter: Payer: Self-pay | Admitting: Family Medicine

## 2021-04-26 ENCOUNTER — Ambulatory Visit: Payer: BC Managed Care – PPO | Admitting: Family Medicine

## 2021-04-26 ENCOUNTER — Encounter (INDEPENDENT_AMBULATORY_CARE_PROVIDER_SITE_OTHER): Payer: Self-pay | Admitting: *Deleted

## 2021-04-26 ENCOUNTER — Other Ambulatory Visit: Payer: Self-pay

## 2021-04-26 VITALS — BP 205/111 | HR 77 | Ht 65.0 in | Wt 216.0 lb

## 2021-04-26 DIAGNOSIS — D508 Other iron deficiency anemias: Secondary | ICD-10-CM

## 2021-04-26 DIAGNOSIS — E7849 Other hyperlipidemia: Secondary | ICD-10-CM

## 2021-04-26 DIAGNOSIS — E559 Vitamin D deficiency, unspecified: Secondary | ICD-10-CM | POA: Diagnosis not present

## 2021-04-26 DIAGNOSIS — E8881 Metabolic syndrome: Secondary | ICD-10-CM

## 2021-04-26 DIAGNOSIS — I10 Essential (primary) hypertension: Secondary | ICD-10-CM | POA: Diagnosis not present

## 2021-04-26 DIAGNOSIS — E890 Postprocedural hypothyroidism: Secondary | ICD-10-CM

## 2021-04-26 DIAGNOSIS — D539 Nutritional anemia, unspecified: Secondary | ICD-10-CM | POA: Diagnosis not present

## 2021-04-26 DIAGNOSIS — G4733 Obstructive sleep apnea (adult) (pediatric): Secondary | ICD-10-CM

## 2021-04-26 DIAGNOSIS — E119 Type 2 diabetes mellitus without complications: Secondary | ICD-10-CM

## 2021-04-26 DIAGNOSIS — D126 Benign neoplasm of colon, unspecified: Secondary | ICD-10-CM

## 2021-04-26 DIAGNOSIS — E1159 Type 2 diabetes mellitus with other circulatory complications: Secondary | ICD-10-CM | POA: Diagnosis not present

## 2021-04-26 DIAGNOSIS — Z1211 Encounter for screening for malignant neoplasm of colon: Secondary | ICD-10-CM

## 2021-04-26 LAB — POCT GLYCOSYLATED HEMOGLOBIN (HGB A1C): HbA1c, POC (prediabetic range): 5.9 % (ref 5.7–6.4)

## 2021-04-26 MED ORDER — TRIAMTERENE-HCTZ 75-50 MG PO TABS: 1.0000 | ORAL_TABLET | Freq: Every day | ORAL | 3 refills | Status: DC

## 2021-04-26 NOTE — Assessment & Plan Note (Signed)
  Patient re-educated about  the importance of commitment to a  minimum of 150 minutes of exercise per week as able.  The importance of healthy food choices with portion control discussed, as well as eating regularly and within a 12 hour window most days. The need to choose "clean , green" food 50 to 75% of the time is discussed, as well as to make water the primary drink and set a goal of 64 ounces water daily.    Weight /BMI 04/26/2021 12/01/2020 10/15/2020  WEIGHT 216 lb 218 lb 221 lb  HEIGHT 5\' 5"  5\' 5"  5\' 5"   BMI 35.94 kg/m2 36.28 kg/m2 36.78 kg/m2

## 2021-04-26 NOTE — Assessment & Plan Note (Signed)
Needs to reschedule appt

## 2021-04-26 NOTE — Assessment & Plan Note (Signed)
rept colonoscopy past  due, referral entered

## 2021-04-26 NOTE — Assessment & Plan Note (Signed)
Updated lab needed.  

## 2021-04-26 NOTE — Assessment & Plan Note (Signed)
Tracy Joseph is reminded of the importance of commitment to daily physical activity for 30 minutes or more, as able and the need to limit carbohydrate intake to 30 to 60 grams per meal to help with blood sugar control.      Tracy Joseph is reminded of the importance of daily foot exam, annual eye examination, and good blood sugar, blood pressure and cholesterol control.  Diabetic Labs Latest Ref Rng & Units 04/26/2021 11/10/2020 10/22/2020 10/12/2020 09/16/2019  HbA1c 5.7 - 6.4 % 5.9 - - 6.1(A) 7.2(H)  Chol <200 mg/dL - - - - 213(H)  HDL > OR = 50 mg/dL - - - - 45(L)  Calc LDL mg/dL (calc) - - - - 136(H)  Triglycerides <150 mg/dL - - - - 183(H)  Creatinine 0.57 - 1.00 mg/dL - 1.34(H) 1.54(H) - 1.14(H)   BP/Weight 04/26/2021 12/01/2020 11/10/2020 10/15/2020 10/12/2020 09/25/2019 01/14/7866  Systolic BP 672 094 709 628 366 294 765  Diastolic BP 465 035 465 681 139 100 112  Wt. (Lbs) 216 218 - 221 224 240 236  BMI 35.94 36.28 - 36.78 37.28 39.94 39.27   Foot/eye exam completion dates 12/01/2020 05/05/2019  Foot Form Completion Done Done    Improved, congrats!

## 2021-04-26 NOTE — Progress Notes (Signed)
Tracy Joseph     MRN: [id number]      DOB: 07/28/1971   HPI Tracy Joseph is here for follow up and re-evaluation of chronic medical conditions, medication management and review of any available recent lab and radiology data.  Preventive health is updated, specifically  Cancer screening and Immunization.   Questions or concerns regarding consultations or procedures which the PT has had in the interim are  addressed. The PT denies any adverse reactions to current medications since the last visit.  There are no new concerns.  There are no specific complaints   ROS Denies recent fever or chills. Denies sinus pressure, nasal congestion, ear pain or sore throat. Denies chest congestion, productive cough or wheezing. Denies chest pains, palpitations and leg swelling Denies abdominal pain, nausea, vomiting,diarrhea or constipation.   Denies dysuria, frequency, hesitancy or incontinence. Denies joint pain, swelling and limitation in mobility. Denies headaches, seizures, numbness, or tingling. Denies depression, anxiety or insomnia. Denies skin break down or rash.   PE  BP (!) 205/111   Pulse 77   Ht 5\' 5"  (1.651 m)   Wt 216 lb (98 kg)   SpO2 94%   BMI 35.94 kg/m   Patient alert and oriented and in no cardiopulmonary distress.  HEENT: No facial asymmetry, EOMI,     Neck supple .  Chest: Clear to auscultation bilaterally.  CVS: S1, S2 no murmurs, no S3.Regular rate.  ABD: Soft non tender.   Ext: No edema  MS: Adequate ROM spine, shoulders, hips and knees.  Skin: Intact, no ulcerations or rash noted.  Psych: Good eye contact, normal affect. Memory intact not anxious or depressed appearing.  CNS: CN 2-12 intact, power,  normal throughout.no focal deficits noted.   Assessment & Plan  Malignant hypertension Uncontrolled, not taking maxzide and not certain of compliance otherwise , same is stressed DASH diet and commitment to daily physical activity for a minimum of 30  minutes discussed and encouraged, as a part of hypertension management. The importance of attaining a healthy weight is also discussed.  BP/Weight 04/26/2021 12/01/2020 11/10/2020 10/15/2020 10/12/2020 09/25/2019 1/44/3154  Systolic BP 008 676 195 093 267 124 580  Diastolic BP 998 338 250 539 139 100 112  Wt. (Lbs) 216 218 - 221 224 240 236  BMI 35.94 36.28 - 36.78 37.28 39.94 39.27       Diabetes mellitus with coincident hypertension ([place]) Tracy Joseph is reminded of the importance of commitment to daily physical activity for 30 minutes or more, as able and the need to limit carbohydrate intake to 30 to 60 grams per meal to help with blood sugar control.      Tracy Joseph is reminded of the importance of daily foot exam, annual eye examination, and good blood sugar, blood pressure and cholesterol control.  Diabetic Labs Latest Ref Rng & Units 04/26/2021 11/10/2020 10/22/2020 10/12/2020 09/16/2019  HbA1c 5.7 - 6.4 % 5.9 - - 6.1(A) 7.2(H)  Chol <200 mg/dL - - - - 213(H)  HDL > OR = 50 mg/dL - - - - 45(L)  Calc LDL mg/dL (calc) - - - - 136(H)  Triglycerides <150 mg/dL - - - - 183(H)  Creatinine 0.57 - 1.00 mg/dL - 1.34(H) 1.54(H) - 1.14(H)   BP/Weight 04/26/2021 12/01/2020 11/10/2020 10/15/2020 10/12/2020 09/25/2019 7/67/3419  Systolic BP 379 024 097 353 299 242 683  Diastolic BP 419 622 297 989 139 100 112  Wt. (Lbs) 216 218 - 221 224 240 236  BMI  35.94 36.28 - 36.78 37.28 39.94 39.27   Foot/eye exam completion dates 12/01/2020 05/05/2019  Foot Form Completion Done Done    Improved, congrats!    OSA (obstructive sleep apnea) Needs to reschedule appt  Tubular adenoma of colon rept colonoscopy past  due, referral entered  Hypothyroidism following radioiodine therapy Updated lab needed   Morbid obesity [facility])  Patient re-educated about  the importance of commitment to a  minimum of 150 minutes of exercise per week as able.  The importance of healthy food choices with portion  control discussed, as well as eating regularly and within a 12 hour window most days. The need to choose "clean , green" food 50 to 75% of the time is discussed, as well as to make water the primary drink and set a goal of 64 ounces water daily.    Weight /BMI 04/26/2021 12/01/2020 10/15/2020  WEIGHT 216 lb 218 lb 221 lb  HEIGHT 5\' 5"  5\' 5"  5\' 5"   BMI 35.94 kg/m2 36.28 kg/m2 36.78 kg/m2     \

## 2021-04-26 NOTE — Assessment & Plan Note (Signed)
Uncontrolled, not taking maxzide and not certain of compliance otherwise , same is stressed DASH diet and commitment to daily physical activity for a minimum of 30 minutes discussed and encouraged, as a part of hypertension management. The importance of attaining a healthy weight is also discussed.  BP/Weight 04/26/2021 12/01/2020 11/10/2020 10/15/2020 10/12/2020 09/25/2019 07/06/9469  Systolic BP 962 836 629 476 546 503 546  Diastolic BP 568 127 517 001 139 100 112  Wt. (Lbs) 216 218 - 221 224 240 236  BMI 35.94 36.28 - 36.78 37.28 39.94 39.27

## 2021-04-26 NOTE — Patient Instructions (Addendum)
F/U in 3 weeks in office , re evaluate blood pressure, call if you need me before  Please take BP meds at 6am, 2pm and 10 pm  And 10 am and 10 pm every day. VERY important that you take ALL FOUR as prescribed for blood pressure to be controlled   Please reschedule your appt for the sleep study  Labs today, lipid, cmp and EGFr, cBC, TSH, vit D and magnesium  Congrats on weight loss and improved blood sugar It is important that you exercise regularly at least 30 minutes 5 times a week. If you develop chest pain, have severe difficulty breathing, or feel very tired, stop exercising immediately and seek medical attention    You are referred for colonoscopy     [email or web address]">  DASH Eating Plan DASH stands for Dietary Approaches to Stop Hypertension. The DASH eating plan is a healthy eating plan that has been shown to:  Reduce high blood pressure (hypertension).  Reduce your risk for type 2 diabetes, heart disease, and stroke.  Help with weight loss. What are tips for following this plan? Reading food labels  Check food labels for the amount of salt (sodium) per serving. Choose foods with less than 5 percent of the Daily Value of sodium. Generally, foods with less than 300 milligrams (mg) of sodium per serving fit into this eating plan.  To find whole grains, look for the word "whole" as the first word in the ingredient list. Shopping  Buy products labeled as "low-sodium" or "no salt added."  Buy fresh foods. Avoid canned foods and pre-made or frozen meals. Cooking  Avoid adding salt when cooking. Use salt-free seasonings or herbs instead of table salt or sea salt. Check with your health care provider or pharmacist before using salt substitutes.  Do not fry foods. Cook foods using healthy methods such as baking, boiling, grilling, roasting, and broiling instead.  Cook with heart-healthy oils, such as olive, canola, avocado,  soybean, or sunflower oil. Meal planning  Eat a balanced diet that includes: ? 4 or more servings of fruits and 4 or more servings of vegetables each day. Try to fill one-half of your plate with fruits and vegetables. ? 6-8 servings of whole grains each day. ? Less than 6 oz (170 g) of lean meat, poultry, or fish each day. A 3-oz (85-g) serving of meat is about the same size as a deck of cards. One egg equals 1 oz (28 g). ? 2-3 servings of low-fat dairy each day. One serving is 1 cup (237 mL). ? 1 serving of nuts, seeds, or beans 5 times each week. ? 2-3 servings of heart-healthy fats. Healthy fats called omega-3 fatty acids are found in foods such as walnuts, flaxseeds, fortified milks, and eggs. These fats are also found in cold-water fish, such as sardines, salmon, and mackerel.  Limit how much you eat of: ? Canned or prepackaged foods. ? Food that is high in trans fat, such as some fried foods. ? Food that is high in saturated fat, such as fatty meat. ? Desserts and other sweets, sugary drinks, and other foods with added sugar. ? Full-fat dairy products.  Do not salt foods before eating.  Do not eat more than 4 egg yolks a week.  Try to eat at least 2 vegetarian meals a week.  Eat more home-cooked food and less restaurant, buffet, and fast food.   Lifestyle  When eating at a restaurant, ask that your food be prepared with less salt or  no salt, if possible.  If you drink alcohol: ? Limit how much you use to:  0-1 drink a day for women who are not pregnant.  0-2 drinks a day for men. ? Be aware of how much alcohol is in your drink. In the [place] one drink equals one 12 oz bottle of beer (355 mL), one 5 oz glass of wine (148 mL), or one 1 oz glass of hard liquor (44 mL). General information  Avoid eating more than 2,300 mg of salt a day. If you have hypertension, you may need to reduce your sodium intake to 1,500 mg a day.  Work with your health care provider to maintain a  healthy body weight or to lose weight. Ask what an ideal weight is for you.  Get at least 30 minutes of exercise that causes your heart to beat faster (aerobic exercise) most days of the week. Activities may include walking, swimming, or biking.  Work with your health care provider or dietitian to adjust your eating plan to your individual calorie needs. What foods should I eat? Fruits All fresh, dried, or frozen fruit. Canned fruit in natural juice (without added sugar). Vegetables Fresh or frozen vegetables (raw, steamed, roasted, or grilled). Low-sodium or reduced-sodium tomato and vegetable juice. Low-sodium or reduced-sodium tomato sauce and tomato paste. Low-sodium or reduced-sodium canned vegetables. Grains Whole-grain or whole-wheat bread. Whole-grain or whole-wheat pasta. Brown rice. [place]. Bulgur. Whole-grain and low-sodium cereals. Pita bread. Low-fat, low-sodium crackers. Whole-wheat flour tortillas. Meats and other proteins Skinless chicken or [place]. Ground chicken or [place]. Pork with fat trimmed off. Fish and seafood. Egg whites. Dried beans, peas, or lentils. Unsalted nuts, nut butters, and seeds. Unsalted canned beans. Lean cuts of beef with fat trimmed off. Low-sodium, lean precooked or cured meat, such as sausages or meat loaves. Dairy Low-fat (1%) or fat-free (skim) milk. Reduced-fat, low-fat, or fat-free cheeses. Nonfat, low-sodium ricotta or cottage cheese. Low-fat or nonfat yogurt. Low-fat, low-sodium cheese. Fats and oils Soft margarine without trans fats. Vegetable oil. Reduced-fat, low-fat, or light mayonnaise and salad dressings (reduced-sodium). Canola, safflower, olive, avocado, soybean, and sunflower oils. Avocado. Seasonings and condiments Herbs. Spices. Seasoning mixes without salt. Other foods Unsalted popcorn and pretzels. Fat-free sweets. The items listed above may not be a complete list of foods and beverages you can eat. Contact a dietitian for  more information. What foods should I avoid? Fruits Canned fruit in a light or heavy syrup. Fried fruit. Fruit in cream or butter sauce. Vegetables Creamed or fried vegetables. Vegetables in a cheese sauce. Regular canned vegetables (not low-sodium or reduced-sodium). Regular canned tomato sauce and paste (not low-sodium or reduced-sodium). Regular tomato and vegetable juice (not low-sodium or reduced-sodium). Angie Fava. Olives. Grains Baked goods made with fat, such as croissants, muffins, or some breads. Dry pasta or rice meal packs. Meats and other proteins Fatty cuts of meat. Ribs. Fried meat. Berniece Salines. Bologna, salami, and other precooked or cured meats, such as sausages or meat loaves. Fat from the back of a pig (fatback). Bratwurst. Salted nuts and seeds. Canned beans with added salt. Canned or smoked fish. Whole eggs or egg yolks. Chicken or [place] with skin. Dairy Whole or 2% milk, cream, and half-and-half. Whole or full-fat cream cheese. Whole-fat or sweetened yogurt. Full-fat cheese. Nondairy creamers. Whipped toppings. Processed cheese and cheese spreads. Fats and oils Butter. Stick margarine. Lard. Shortening. Ghee. Bacon fat. Tropical oils, such as coconut, palm kernel, or palm oil. Seasonings and condiments Onion salt, garlic salt,  seasoned salt, table salt, and sea salt. Worcestershire sauce. Tartar sauce. Barbecue sauce. Teriyaki sauce. Soy sauce, including reduced-sodium. Steak sauce. Canned and packaged gravies. Fish sauce. Oyster sauce. Cocktail sauce. Store-bought horseradish. Ketchup. Mustard. Meat flavorings and tenderizers. Bouillon cubes. Hot sauces. Pre-made or packaged marinades. Pre-made or packaged taco seasonings. Relishes. Regular salad dressings. Other foods Salted popcorn and pretzels. The items listed above may not be a complete list of foods and beverages you should avoid. Contact a dietitian for more information. Where to find more information  National Heart,  Lung, and Blood Institute: https://wilson-eaton.com/  American Heart Association: www.heart.org  Academy of Nutrition and Dietetics: www.eatright.[place]: www.kidney.org Summary  The DASH eating plan is a healthy eating plan that has been shown to reduce high blood pressure (hypertension). It may also reduce your risk for type 2 diabetes, heart disease, and stroke.  When on the DASH eating plan, aim to eat more fresh fruits and vegetables, whole grains, lean proteins, low-fat dairy, and heart-healthy fats.  With the DASH eating plan, you should limit salt (sodium) intake to 2,300 mg a day. If you have hypertension, you may need to reduce your sodium intake to 1,500 mg a day.  Work with your health care provider or dietitian to adjust your eating plan to your individual calorie needs. This information is not intended to replace advice given to you by your health care provider. Make sure you discuss any questions you have with your health care provider. Document Revised: 11/07/2019 Document Reviewed: 11/07/2019 Elsevier Patient Education  2021 [employer].

## 2021-04-27 ENCOUNTER — Other Ambulatory Visit: Payer: Self-pay | Admitting: Family Medicine

## 2021-04-27 LAB — CBC WITH DIFFERENTIAL
Basophils Absolute: 0 10*3/uL (ref 0.0–0.2)
Basos: 0 %
EOS (ABSOLUTE): 0.1 10*3/uL (ref 0.0–0.4)
Eos: 1 %
Hematocrit: 31.6 % — ABNORMAL LOW (ref 34.0–46.6)
Hemoglobin: 10 g/dL — ABNORMAL LOW (ref 11.1–15.9)
Immature Grans (Abs): 0 10*3/uL (ref 0.0–0.1)
Immature Granulocytes: 0 %
Lymphocytes Absolute: 1.4 10*3/uL (ref 0.7–3.1)
Lymphs: 19 %
MCH: 24.9 pg — ABNORMAL LOW (ref 26.6–33.0)
MCHC: 31.6 g/dL (ref 31.5–35.7)
MCV: 79 fL (ref 79–97)
Monocytes Absolute: 0.6 10*3/uL (ref 0.1–0.9)
Monocytes: 8 %
Neutrophils Absolute: 5.3 10*3/uL (ref 1.4–7.0)
Neutrophils: 72 %
RBC: 4.01 x10E6/uL (ref 3.77–5.28)
RDW: 15.6 % — ABNORMAL HIGH (ref 11.7–15.4)
WBC: 7.4 10*3/uL (ref 3.4–10.8)

## 2021-04-27 LAB — CMP14+EGFR
ALT: 5 IU/L (ref 0–32)
AST: 10 IU/L (ref 0–40)
Albumin/Globulin Ratio: 1.2 (ref 1.2–2.2)
Albumin: 3.9 g/dL (ref 3.8–4.9)
Alkaline Phosphatase: 90 IU/L (ref 44–121)
BUN/Creatinine Ratio: 12 (ref 9–23)
BUN: 16 mg/dL (ref 6–24)
Bilirubin Total: 0.2 mg/dL (ref 0.0–1.2)
CO2: 21 mmol/L (ref 20–29)
Calcium: 9 mg/dL (ref 8.7–10.2)
Chloride: 104 mmol/L (ref 96–106)
Creatinine, Ser: 1.31 mg/dL — ABNORMAL HIGH (ref 0.57–1.00)
Globulin, Total: 3.2 g/dL (ref 1.5–4.5)
Glucose: 98 mg/dL (ref 65–99)
Potassium: 3.7 mmol/L (ref 3.5–5.2)
Sodium: 140 mmol/L (ref 134–144)
Total Protein: 7.1 g/dL (ref 6.0–8.5)
eGFR: 49 mL/min/{1.73_m2} — ABNORMAL LOW (ref >59)

## 2021-04-27 LAB — MAGNESIUM: Magnesium: 1.9 mg/dL (ref 1.6–2.3)

## 2021-04-27 LAB — VITAMIN D 25 HYDROXY (VIT D DEFICIENCY, FRACTURES): Vit D, 25-Hydroxy: 14.1 ng/mL — ABNORMAL LOW (ref 30.0–100.0)

## 2021-04-27 LAB — LIPID PANEL
Chol/HDL Ratio: 3.7 ratio (ref 0.0–4.4)
Cholesterol, Total: 179 mg/dL (ref 100–199)
HDL: 49 mg/dL (ref >39)
LDL Chol Calc (NIH): 112 mg/dL — ABNORMAL HIGH (ref 0–99)
Triglycerides: 101 mg/dL (ref 0–149)
VLDL Cholesterol Cal: 18 mg/dL (ref 5–40)

## 2021-04-27 LAB — TSH: TSH: 1.46 u[IU]/mL (ref 0.450–4.500)

## 2021-04-27 MED ORDER — ERGOCALCIFEROL 1.25 MG (50000 UT) PO CAPS: 50000.0000 [IU] | ORAL_CAPSULE | ORAL | 2 refills | Status: DC

## 2021-05-01 LAB — IRON: Iron: 21 ug/dL — ABNORMAL LOW (ref 27–159)

## 2021-05-01 LAB — SPECIMEN STATUS REPORT

## 2021-05-01 LAB — VITAMIN B12: Vitamin B-12: 346 pg/mL (ref 232–1245)

## 2021-05-17 ENCOUNTER — Ambulatory Visit: Payer: BC Managed Care – PPO | Admitting: Family Medicine

## 2021-05-23 ENCOUNTER — Ambulatory Visit: Payer: BC Managed Care – PPO | Admitting: Family Medicine

## 2021-06-14 ENCOUNTER — Other Ambulatory Visit: Payer: Self-pay

## 2021-06-14 ENCOUNTER — Ambulatory Visit: Payer: BC Managed Care – PPO | Admitting: Family Medicine

## 2021-06-14 ENCOUNTER — Encounter: Payer: Self-pay | Admitting: Family Medicine

## 2021-06-14 VITALS — BP 130/82 | HR 92 | Resp 16 | Ht 65.0 in | Wt 213.8 lb

## 2021-06-14 DIAGNOSIS — I160 Hypertensive urgency: Secondary | ICD-10-CM

## 2021-06-14 DIAGNOSIS — I10 Essential (primary) hypertension: Secondary | ICD-10-CM

## 2021-06-14 DIAGNOSIS — E1159 Type 2 diabetes mellitus with other circulatory complications: Secondary | ICD-10-CM | POA: Diagnosis not present

## 2021-06-14 DIAGNOSIS — D126 Benign neoplasm of colon, unspecified: Secondary | ICD-10-CM

## 2021-06-14 DIAGNOSIS — E119 Type 2 diabetes mellitus without complications: Secondary | ICD-10-CM | POA: Diagnosis not present

## 2021-06-14 DIAGNOSIS — N920 Excessive and frequent menstruation with regular cycle: Secondary | ICD-10-CM

## 2021-06-14 DIAGNOSIS — E7849 Other hyperlipidemia: Secondary | ICD-10-CM

## 2021-06-14 NOTE — Patient Instructions (Addendum)
F/U in end November/ early Dec, call if you need me sooner  Please call gI re colonoscopy appt ( nurse to provide number)  You are referred for annual exam with gyne, due to heavy periods with low iron   Continue bP meds, exercise, and fruit and vegetable based diet  Aim is to be 199 pounds   Please take iron 325 mg (OTC) 1 to 2 daily  Vit D is once weekly   Fasting lipid, chem7 and EGFr and hBA1C 3 to 5 days before next visit in December   Thanks for choosing [facility], we consider it a privelige to serve you. You have made our day!!!

## 2021-06-20 ENCOUNTER — Encounter: Payer: Self-pay | Admitting: Family Medicine

## 2021-06-20 NOTE — Assessment & Plan Note (Signed)
  Patient re-educated about  the importance of commitment to a  minimum of 150 minutes of exercise per week as able.  The importance of healthy food choices with portion control discussed, as well as eating regularly and within a 12 hour window most days. The need to choose "clean , green" food 50 to 75% of the time is discussed, as well as to make water the primary drink and set a goal of 64 ounces water daily.    Weight /BMI 06/14/2021 04/26/2021 12/01/2020  WEIGHT 213 lb 12.8 oz 216 lb 218 lb  HEIGHT 5\' 5"  5\' 5"  5\' 5"   BMI 35.58 kg/m2 35.94 kg/m2 36.28 kg/m2    Improved, she is applauded on this

## 2021-06-20 NOTE — Assessment & Plan Note (Signed)
Persists and has IDA refer to [facility] for eval and management

## 2021-06-20 NOTE — Assessment & Plan Note (Signed)
Colonoscopy past due pt to directly contact GI

## 2021-06-20 NOTE — Assessment & Plan Note (Signed)
Controlled, no change in medication DASH diet and commitment to daily physical activity for a minimum of 30 minutes discussed and encouraged, as a part of hypertension management. The importance of attaining a healthy weight is also discussed.  BP/Weight 06/14/2021 04/26/2021 12/01/2020 11/10/2020 10/15/2020 10/12/2020 82/05/4157  Systolic BP 309 407 680 881 103 159 458  Diastolic BP 82 592 924 462 135 139 100  Wt. (Lbs) 213.8 216 218 - 221 224 240  BMI 35.58 35.94 36.28 - 36.78 37.28 39.94

## 2021-06-20 NOTE — Assessment & Plan Note (Signed)
Tracy Joseph is reminded of the importance of commitment to daily physical activity for 30 minutes or more, as able and the need to limit carbohydrate intake to 30 to 60 grams per meal to help with blood sugar control.   The need to take medication as prescribed, test blood sugar as directed, and to call between visits if there is a concern that blood sugar is uncontrolled is also discussed.   Tracy Joseph is reminded of the importance of daily foot exam, annual eye examination, and good blood sugar, blood pressure and cholesterol control. Improving with lifestyle change  Diabetic Labs Latest Ref Rng & Units 04/26/2021 11/10/2020 10/22/2020 10/12/2020 09/16/2019  HbA1c 5.7 - 6.4 % 5.9 - - 6.1(A) 7.2(H)  Chol 100 - 199 mg/dL 179 - - - 213(H)  HDL >39 mg/dL 49 - - - 45(L)  Calc LDL 0 - 99 mg/dL 112(H) - - - 136(H)  Triglycerides 0 - 149 mg/dL 101 - - - 183(H)  Creatinine 0.57 - 1.00 mg/dL 1.31(H) 1.34(H) 1.54(H) - 1.14(H)   BP/Weight 06/14/2021 04/26/2021 12/01/2020 11/10/2020 10/15/2020 10/12/2020 99/02/7168  Systolic BP 678 938 101 751 025 852 778  Diastolic BP 82 242 353 614 135 139 100  Wt. (Lbs) 213.8 216 218 - 221 224 240  BMI 35.58 35.94 36.28 - 36.78 37.28 39.94   Foot/eye exam completion dates 12/01/2020 05/05/2019  Foot Form Completion Done Done

## 2021-06-20 NOTE — Progress Notes (Signed)
Tracy Joseph     MRN: [id number]      DOB: November 21, 1971   HPI Tracy Joseph is here for follow up and re-evaluation of chronic medical conditions, medication management and review of any available recent lab and radiology data.  Preventive health is updated, specifically  Cancer screening and Immunization.   Questions or concerns regarding consultations or procedures which the PT has had in the interim are  addressed. The PT denies any adverse reactions to current medications since the last visit.  Heavy cycls with low iron refer to Gyne   ROS Denies recent fever or chills. Denies sinus pressure, nasal congestion, ear pain or sore throat. Denies chest congestion, productive cough or wheezing. Denies chest pains, palpitations and leg swelling Denies abdominal pain, nausea, vomiting,diarrhea or constipation.   Denies dysuria, frequency, hesitancy or incontinence. Denies joint pain, swelling and limitation in mobility. Denies headaches, seizures, numbness, or tingling. Denies depression, anxiety or insomnia. Denies skin break down or rash.   PE  BP 130/82   Pulse 92   Resp 16   Ht 5\' 5"  (1.651 m)   Wt 213 lb 12.8 oz (97 kg)   BMI 35.58 kg/m   Patient alert and oriented and in no cardiopulmonary distress.  HEENT: No facial asymmetry, EOMI,     Neck supple .  Chest: Clear to auscultation bilaterally.  CVS: S1, S2 no murmurs, no S3.Regular rate.  ABD: Soft non tender.   Ext: No edema  MS: Adequate ROM spine, shoulders, hips and knees.  Skin: Intact, no ulcerations or rash noted.  Psych: Good eye contact, normal affect. Memory intact not anxious or depressed appearing.  CNS: CN 2-12 intact, power,  normal throughout.no focal deficits noted.   Assessment & Plan  Heavy menses Persists and has IDA refer to Gyne for eval and management  Morbid obesity ([place])  Patient re-educated about  the importance of commitment to a  minimum of 150 minutes of exercise per week as  able.  The importance of healthy food choices with portion control discussed, as well as eating regularly and within a 12 hour window most days. The need to choose "clean , green" food 50 to 75% of the time is discussed, as well as to make water the primary drink and set a goal of 64 ounces water daily.    Weight /BMI 06/14/2021 04/26/2021 12/01/2020  WEIGHT 213 lb 12.8 oz 216 lb 218 lb  HEIGHT 5\' 5"  5\' 5"  5\' 5"   BMI 35.58 kg/m2 35.94 kg/m2 36.28 kg/m2    Improved, she is applauded on this  Type 2 diabetes mellitus with vascular disease ([place]) Tracy Joseph is reminded of the importance of commitment to daily physical activity for 30 minutes or more, as able and the need to limit carbohydrate intake to 30 to 60 grams per meal to help with blood sugar control.   The need to take medication as prescribed, test blood sugar as directed, and to call between visits if there is a concern that blood sugar is uncontrolled is also discussed.   Tracy Joseph is reminded of the importance of daily foot exam, annual eye examination, and good blood sugar, blood pressure and cholesterol control. Improving with lifestyle change  Diabetic Labs Latest Ref Rng & Units 04/26/2021 11/10/2020 10/22/2020 10/12/2020 09/16/2019  HbA1c 5.7 - 6.4 % 5.9 - - 6.1(A) 7.2(H)  Chol 100 - 199 mg/dL 179 - - - 213(H)  HDL >39 mg/dL 49 - - - 45(L)  Calc  LDL 0 - 99 mg/dL 112(H) - - - 136(H)  Triglycerides 0 - 149 mg/dL 101 - - - 183(H)  Creatinine 0.57 - 1.00 mg/dL 1.31(H) 1.34(H) 1.54(H) - 1.14(H)   BP/Weight 06/14/2021 04/26/2021 12/01/2020 11/10/2020 10/15/2020 10/12/2020 43/07/3817  Systolic BP 403 754 360 677 034 035 248  Diastolic BP 82 185 909 311 135 139 100  Wt. (Lbs) 213.8 216 218 - 221 224 240  BMI 35.58 35.94 36.28 - 36.78 37.28 39.94   Foot/eye exam completion dates 12/01/2020 05/05/2019  Foot Form Completion Done Done        Malignant hypertension Controlled, no change in medication DASH diet and  commitment to daily physical activity for a minimum of 30 minutes discussed and encouraged, as a part of hypertension management. The importance of attaining a healthy weight is also discussed.  BP/Weight 06/14/2021 04/26/2021 12/01/2020 11/10/2020 10/15/2020 10/12/2020 21/05/2445  Systolic BP 950 722 575 051 833 582 518  Diastolic BP 82 984 210 312 135 139 100  Wt. (Lbs) 213.8 216 218 - 221 224 240  BMI 35.58 35.94 36.28 - 36.78 37.28 39.94       Tubular adenoma of colon Colonoscopy past due pt to directly contact GI

## 2021-07-12 ENCOUNTER — Other Ambulatory Visit: Payer: Self-pay | Admitting: Family Medicine

## 2021-11-08 ENCOUNTER — Other Ambulatory Visit: Payer: Self-pay

## 2021-11-08 ENCOUNTER — Ambulatory Visit (INDEPENDENT_AMBULATORY_CARE_PROVIDER_SITE_OTHER): Payer: Managed Care, Other (non HMO) | Admitting: Family Medicine

## 2021-11-08 ENCOUNTER — Encounter: Payer: Self-pay | Admitting: Family Medicine

## 2021-11-08 VITALS — BP 195/100 | HR 82 | Resp 17 | Ht 65.0 in | Wt 229.0 lb

## 2021-11-08 DIAGNOSIS — Z23 Encounter for immunization: Secondary | ICD-10-CM | POA: Diagnosis not present

## 2021-11-08 DIAGNOSIS — E890 Postprocedural hypothyroidism: Secondary | ICD-10-CM

## 2021-11-08 DIAGNOSIS — E7849 Other hyperlipidemia: Secondary | ICD-10-CM

## 2021-11-08 DIAGNOSIS — E1159 Type 2 diabetes mellitus with other circulatory complications: Secondary | ICD-10-CM | POA: Diagnosis not present

## 2021-11-08 DIAGNOSIS — I10 Essential (primary) hypertension: Secondary | ICD-10-CM | POA: Diagnosis not present

## 2021-11-08 DIAGNOSIS — Z1231 Encounter for screening mammogram for malignant neoplasm of breast: Secondary | ICD-10-CM

## 2021-11-08 NOTE — Patient Instructions (Signed)
F/U in 4  months, call if you need me sooner  Flu vaccine today  hBA1C, tSH , chem 7 and eGFr today   pLEASE fill and take bP meds eVERY day, you need them   Start oTC vit D3 5000 iU every day  Start iron 325 mg every day  Please schedule mammogram at checkout  It is important that you exercise regularly at least 30 minutes 5 times a week. If you develop chest pain, have severe difficulty breathing, or feel very tired, stop exercising immediately and seek medical attention   Think about what you will eat, plan ahead. Choose " clean, green, fresh or frozen" over canned, processed or packaged foods which are more sugary, salty and fatty. 70 to 75% of food eaten should be vegetables and fruit. Three meals at set times with snacks allowed between meals, but they must be fruit or vegetables. Aim to eat over a 12 hour period , example 7 am to 7 pm, and STOP after  your last meal of the day. Drink water,generally about 64 ounces per day, no other drink is as healthy. Fruit juice is best enjoyed in a healthy way, by EATING the fruit. Thanks for choosing [facility], we consider it a privelige to serve you.    

## 2021-11-13 ENCOUNTER — Encounter: Payer: Self-pay | Admitting: Family Medicine

## 2021-11-13 NOTE — Assessment & Plan Note (Signed)
  Patient re-educated about  the importance of commitment to a  minimum of 150 minutes of exercise per week as able.  The importance of healthy food choices with portion control discussed, as well as eating regularly and within a 12 hour window most days. The need to choose "clean , green" food 50 to 75% of the time is discussed, as well as to make water the primary drink and set a goal of 64 ounces water daily.    Weight /BMI 11/08/2021 06/14/2021 04/26/2021  WEIGHT 229 lb 0.6 oz 213 lb 12.8 oz 216 lb  HEIGHT 5\' 5"  5\' 5"  5\' 5"   BMI 38.11 kg/m2 35.58 kg/m2 35.94 kg/m2

## 2021-11-13 NOTE — Assessment & Plan Note (Signed)
Updated lab needed , controlled when last checked

## 2021-11-13 NOTE — Progress Notes (Signed)
HALSEY PERSAUD     MRN: [id number]      DOB: [birth year]   HPI Ms. Stief is here for follow up and re-evaluation of chronic medical conditions, medication management and review of any available recent lab and radiology data.  Preventive health is updated, specifically  Cancer screening and Immunization.   Questions or concerns regarding consultations or procedures which the PT has had in the interim are  addressed. The PT denies any adverse reactions to current medications since the last visit.  There are no new concerns.  There are no specific complaints   ROS Denies recent fever or chills. Denies sinus pressure, nasal congestion, ear pain or sore throat. Denies chest congestion, productive cough or wheezing. Denies chest pains, palpitations and leg swelling Denies abdominal pain, nausea, vomiting,diarrhea or constipation.   Denies dysuria, frequency, hesitancy or incontinence. Denies joint pain, swelling and limitation in mobility. Denies headaches, seizures, numbness, or tingling. Denies depression, anxiety or insomnia. Denies skin break down or rash.   PE  BP (!) 195/100   Pulse 82   Resp 17   Ht 5\' 5"  (1.651 m)   Wt 229 lb 0.6 oz (103.9 kg)   SpO2 94%   BMI 38.11 kg/m   Patient alert and oriented and in no cardiopulmonary distress.  HEENT: No facial asymmetry, EOMI,     Neck supple .  Chest: Clear to auscultation bilaterally.  CVS: S1, S2 no murmurs, no S3.Regular rate.  ABD: Soft non tender.   Ext: No edema  MS: Adequate ROM spine, shoulders, hips and knees.  Skin: Intact, no ulcerations or rash noted.  Psych: Good eye contact, normal affect. Memory intact not anxious or depressed appearing.  CNS: CN 2-12 intact, power,  normal throughout.no focal deficits noted.   Assessment & Plan  Malignant hypertension Uncontrolled , out of  Of her meds states she will fill DASH diet and commitment to daily physical activity for a minimum of 30 minutes  discussed and encouraged, as a part of hypertension management. The importance of attaining a healthy weight is also discussed.  BP/Weight 11/08/2021 06/14/2021 04/26/2021 12/01/2020 11/10/2020 10/15/2020 60/63/0160  Systolic BP 109 323 557 322 025 427 062  Diastolic BP 376 82 283 151 130 135 139  Wt. (Lbs) 229.04 213.8 216 218 - 221 224  BMI 38.11 35.58 35.94 36.28 - 36.78 37.28       Hypothyroidism following radioiodine therapy Updated lab needed , controlled when last checked   Type 2 diabetes mellitus with vascular disease ([place]) Ms. Bedwell is reminded of the importance of commitment to daily physical activity for 30 minutes or more, as able and the need to limit carbohydrate intake to 30 to 60 grams per meal to help with blood sugar control.   The need to take medication as prescribed, test blood sugar as directed, and to call between visits if there is a concern that blood sugar is uncontrolled is also discussed.   Ms. Burklow is reminded of the importance of daily foot exam, annual eye examination, and good blood sugar, blood pressure and cholesterol control.  Diabetic Labs Latest Ref Rng & Units 04/26/2021 11/10/2020 10/22/2020 10/12/2020 09/16/2019  HbA1c 5.7 - 6.4 % 5.9 - - 6.1(A) 7.2(H)  Chol 100 - 199 mg/dL 179 - - - 213(H)  HDL >39 mg/dL 49 - - - 45(L)  Calc LDL 0 - 99 mg/dL 112(H) - - - 136(H)  Triglycerides 0 - 149 mg/dL 101 - - - 183(H)  Creatinine 0.57 - 1.00 mg/dL 1.31(H) 1.34(H) 1.54(H) - 1.14(H)   BP/Weight 11/08/2021 06/14/2021 04/26/2021 12/01/2020 11/10/2020 10/15/2020 14/38/8875  Systolic BP 797 282 060 156 153 794 327  Diastolic BP 614 82 709 295 130 135 139  Wt. (Lbs) 229.04 213.8 216 218 - 221 224  BMI 38.11 35.58 35.94 36.28 - 36.78 37.28   Foot/eye exam completion dates Latest Ref Rng & Units 03/29/2021 12/01/2020  Eye Exam No Retinopathy No Retinopathy -  Foot Form Completion - - Done      Updated lab needed    Morbid obesity ([place])  Patient  re-educated about  the importance of commitment to a  minimum of 150 minutes of exercise per week as able.  The importance of healthy food choices with portion control discussed, as well as eating regularly and within a 12 hour window most days. The need to choose "clean , green" food 50 to 75% of the time is discussed, as well as to make water the primary drink and set a goal of 64 ounces water daily.    Weight /BMI 11/08/2021 06/14/2021 04/26/2021  WEIGHT 229 lb 0.6 oz 213 lb 12.8 oz 216 lb  HEIGHT 5\' 5"  5\' 5"  5\' 5"   BMI 38.11 kg/m2 35.58 kg/m2 35.94 kg/m2

## 2021-11-13 NOTE — Assessment & Plan Note (Signed)
Uncontrolled , out of  Of her meds states she will fill DASH diet and commitment to daily physical activity for a minimum of 30 minutes discussed and encouraged, as a part of hypertension management. The importance of attaining a healthy weight is also discussed.  BP/Weight 11/08/2021 06/14/2021 04/26/2021 12/01/2020 11/10/2020 10/15/2020 45/80/9983  Systolic BP 382 505 397 673 419 379 024  Diastolic BP 097 82 353 299 130 135 139  Wt. (Lbs) 229.04 213.8 216 218 - 221 224  BMI 38.11 35.58 35.94 36.28 - 36.78 37.28

## 2021-11-13 NOTE — Assessment & Plan Note (Signed)
Tracy Joseph is reminded of the importance of commitment to daily physical activity for 30 minutes or more, as able and the need to limit carbohydrate intake to 30 to 60 grams per meal to help with blood sugar control.   The need to take medication as prescribed, test blood sugar as directed, and to call between visits if there is a concern that blood sugar is uncontrolled is also discussed.   Tracy Joseph is reminded of the importance of daily foot exam, annual eye examination, and good blood sugar, blood pressure and cholesterol control.  Diabetic Labs Latest Ref Rng & Units 04/26/2021 11/10/2020 10/22/2020 10/12/2020 09/16/2019  HbA1c 5.7 - 6.4 % 5.9 - - 6.1(A) 7.2(H)  Chol 100 - 199 mg/dL 179 - - - 213(H)  HDL >39 mg/dL 49 - - - 45(L)  Calc LDL 0 - 99 mg/dL 112(H) - - - 136(H)  Triglycerides 0 - 149 mg/dL 101 - - - 183(H)  Creatinine 0.57 - 1.00 mg/dL 1.31(H) 1.34(H) 1.54(H) - 1.14(H)   BP/Weight 11/08/2021 06/14/2021 04/26/2021 12/01/2020 11/10/2020 10/15/2020 17/35/6701  Systolic BP 410 301 314 388 875 797 282  Diastolic BP 060 82 156 153 130 135 139  Wt. (Lbs) 229.04 213.8 216 218 - 221 224  BMI 38.11 35.58 35.94 36.28 - 36.78 37.28   Foot/eye exam completion dates Latest Ref Rng & Units 03/29/2021 12/01/2020  Eye Exam No Retinopathy No Retinopathy -  Foot Form Completion - - Done      Updated lab needed

## 2021-11-21 ENCOUNTER — Ambulatory Visit: Payer: BC Managed Care – PPO | Admitting: Family Medicine

## 2021-12-18 ENCOUNTER — Other Ambulatory Visit: Payer: Self-pay | Admitting: Family Medicine

## 2022-01-09 ENCOUNTER — Ambulatory Visit (HOSPITAL_COMMUNITY)
Admission: RE | Admit: 2022-01-09 | Discharge: 2022-01-09 | Disposition: A | Discharge status: Home / Self Care | Payer: Managed Care, Other (non HMO) | Source: Ambulatory Visit | Attending: Family Medicine | Admitting: Family Medicine

## 2022-01-09 ENCOUNTER — Other Ambulatory Visit: Payer: Self-pay

## 2022-01-09 DIAGNOSIS — Z1231 Encounter for screening mammogram for malignant neoplasm of breast: Secondary | ICD-10-CM | POA: Diagnosis present

## 2022-03-08 ENCOUNTER — Ambulatory Visit: Payer: Managed Care, Other (non HMO) | Admitting: Family Medicine

## 2022-03-08 ENCOUNTER — Encounter: Payer: Self-pay | Admitting: Family Medicine

## 2022-03-08 ENCOUNTER — Other Ambulatory Visit: Payer: Self-pay

## 2022-03-08 VITALS — BP 192/131 | HR 83 | Ht 65.0 in | Wt 223.1 lb

## 2022-03-08 DIAGNOSIS — D539 Nutritional anemia, unspecified: Secondary | ICD-10-CM

## 2022-03-08 DIAGNOSIS — N920 Excessive and frequent menstruation with regular cycle: Secondary | ICD-10-CM

## 2022-03-08 DIAGNOSIS — R7303 Prediabetes: Secondary | ICD-10-CM

## 2022-03-08 DIAGNOSIS — E890 Postprocedural hypothyroidism: Secondary | ICD-10-CM | POA: Diagnosis not present

## 2022-03-08 DIAGNOSIS — E559 Vitamin D deficiency, unspecified: Secondary | ICD-10-CM

## 2022-03-08 DIAGNOSIS — I10 Essential (primary) hypertension: Secondary | ICD-10-CM | POA: Diagnosis not present

## 2022-03-08 DIAGNOSIS — E119 Type 2 diabetes mellitus without complications: Secondary | ICD-10-CM

## 2022-03-08 DIAGNOSIS — E7849 Other hyperlipidemia: Secondary | ICD-10-CM

## 2022-03-08 DIAGNOSIS — Z1159 Encounter for screening for other viral diseases: Secondary | ICD-10-CM

## 2022-03-08 DIAGNOSIS — D508 Other iron deficiency anemias: Secondary | ICD-10-CM

## 2022-03-08 LAB — POCT GLYCOSYLATED HEMOGLOBIN (HGB A1C): HbA1c, POC (prediabetic range): 6.1 % (ref 5.7–6.4)

## 2022-03-08 NOTE — Patient Instructions (Addendum)
F/u in 10 weeks, call if you need me sooner ? ?Labs today,  TSH, CBC, lipid, cmp and eGFR , vit D and Hep C screen, iron and ferritin ? ?You are referred to Gynecology ? ?NEED to take blood pressure medication regularly as prescribed ? ?CONGRATS on blood sugar ? ?Need shingrix vaccines ? ?It is important that you exercise regularly at least 30 minutes 5 times a week. If you develop chest pain, have severe difficulty breathing, or feel very tired, stop exercising immediately and seek medical attention  ? ? ?Think about what you will eat, plan ahead. ?Choose " clean, green, fresh or frozen" over canned, processed or packaged foods which are more sugary, salty and fatty. ?70 to 75% of food eaten should be vegetables and fruit. ?Three meals at set times with snacks allowed between meals, but they must be fruit or vegetables. ?Aim to eat over a 12 hour period , example 7 am to 7 pm, and STOP after  your last meal of the day. ?Drink water,generally about 64 ounces per day, no other drink is as healthy. Fruit juice is best enjoyed in a healthy way, by EATING the fruit. ?Thanks for choosing [facility], we consider it a privelige to serve you. ? ?

## 2022-03-09 LAB — CMP14+EGFR
ALT: 5 IU/L (ref 0–32)
AST: 15 IU/L (ref 0–40)
Albumin/Globulin Ratio: 1.4 (ref 1.2–2.2)
Albumin: 4.2 g/dL (ref 3.8–4.9)
Alkaline Phosphatase: 98 IU/L (ref 44–121)
BUN/Creatinine Ratio: 10 (ref 9–23)
BUN: 14 mg/dL (ref 6–24)
Bilirubin Total: 0.3 mg/dL (ref 0.0–1.2)
CO2: 26 mmol/L (ref 20–29)
Calcium: 9.1 mg/dL (ref 8.7–10.2)
Chloride: 98 mmol/L (ref 96–106)
Creatinine, Ser: 1.45 mg/dL — ABNORMAL HIGH (ref 0.57–1.00)
Globulin, Total: 3 g/dL (ref 1.5–4.5)
Glucose: 94 mg/dL (ref 70–99)
Potassium: 3.6 mmol/L (ref 3.5–5.2)
Sodium: 136 mmol/L (ref 134–144)
Total Protein: 7.2 g/dL (ref 6.0–8.5)
eGFR: 43 mL/min/{1.73_m2} — ABNORMAL LOW (ref >59)

## 2022-03-09 LAB — LIPID PANEL
Chol/HDL Ratio: 4.3 ratio (ref 0.0–4.4)
Cholesterol, Total: 214 mg/dL — ABNORMAL HIGH (ref 100–199)
HDL: 50 mg/dL (ref >39)
LDL Chol Calc (NIH): 140 mg/dL — ABNORMAL HIGH (ref 0–99)
Triglycerides: 135 mg/dL (ref 0–149)
VLDL Cholesterol Cal: 24 mg/dL (ref 5–40)

## 2022-03-09 LAB — CBC
Hematocrit: 32 % — ABNORMAL LOW (ref 34.0–46.6)
Hemoglobin: 9.7 g/dL — ABNORMAL LOW (ref 11.1–15.9)
MCH: 23.4 pg — ABNORMAL LOW (ref 26.6–33.0)
MCHC: 30.3 g/dL — ABNORMAL LOW (ref 31.5–35.7)
MCV: 77 fL — ABNORMAL LOW (ref 79–97)
Platelets: 339 10*3/uL (ref 150–450)
RBC: 4.15 x10E6/uL (ref 3.77–5.28)
RDW: 15.8 % — ABNORMAL HIGH (ref 11.7–15.4)
WBC: 6.5 10*3/uL (ref 3.4–10.8)

## 2022-03-09 LAB — HEPATITIS C ANTIBODY: Hep C Virus Ab: REACTIVE — AB

## 2022-03-09 LAB — VITAMIN D 25 HYDROXY (VIT D DEFICIENCY, FRACTURES): Vit D, 25-Hydroxy: 17.3 ng/mL — ABNORMAL LOW (ref 30.0–100.0)

## 2022-03-09 LAB — IRON: Iron: 27 ug/dL (ref 27–159)

## 2022-03-09 LAB — FERRITIN: Ferritin: 11 ng/mL — ABNORMAL LOW (ref 15–150)

## 2022-03-09 LAB — TSH: TSH: 21.4 u[IU]/mL — ABNORMAL HIGH (ref 0.450–4.500)

## 2022-03-12 MED ORDER — LEVOTHYROXINE SODIUM 175 MCG PO TABS: ORAL_TABLET | ORAL | 1 refills | Status: DC

## 2022-03-12 MED ORDER — ERGOCALCIFEROL 1.25 MG (50000 UT) PO CAPS: 50000.0000 [IU] | ORAL_CAPSULE | ORAL | 2 refills | Status: DC

## 2022-03-12 NOTE — Assessment & Plan Note (Signed)
Uncorrected needs weekly vit d ?

## 2022-03-12 NOTE — Assessment & Plan Note (Signed)
Needs to commit to twice daily iron 325 mg if able, also needs ot  Address excess bleeding with Gyne ?

## 2022-03-12 NOTE — Assessment & Plan Note (Signed)
Uncorrected, med compliance questionable, will send in Synthroid 175 mcg as before and re check in 6 to 8 weeks ?

## 2022-03-12 NOTE — Assessment & Plan Note (Signed)
Flooding with clots and IDA, gyne eval ?

## 2022-03-12 NOTE — Progress Notes (Signed)
? ?Tracy Joseph     MRN: [id number]      DOB: [birth year] ? ? ?HPI ?Tracy Joseph is here for follow up and re-evaluation of chronic medical conditions, medication management and review of any available recent lab and radiology data.  ?Preventive health is updated, specifically  Cancer screening and Immunization.   ?Questions or concerns regarding consultations or procedures which the PT has had in the interim are  addressed. ?The PT denies any adverse reactions to current medications since the last visit.  ?There are no new concerns.  ?There are no specific complaints  ? ?ROS ?Denies recent fever or chills. ?Denies sinus pressure, nasal congestion, ear pain or sore throat. ?Denies chest congestion, productive cough or wheezing. ?Denies chest pains, palpitations and leg swelling ?Denies abdominal pain, nausea, vomiting,diarrhea or constipation.   ?Denies dysuria, frequency, hesitancy or incontinence. ?Denies joint pain, swelling and limitation in mobility. ?Denies headaches, seizures, numbness, or tingling. ?Denies depression, anxiety or insomnia. ?Denies skin break down or rash. ? ? ?PE ? ?BP (!) 192/131 (BP Location: Left Arm)   Pulse 83   Ht '5\' 5"'$  (1.651 m)   Wt 223 lb 1.3 oz (101.2 kg)   SpO2 98%   BMI 37.12 kg/m?  ? ?Patient alert and oriented and in no cardiopulmonary distress. ? ?HEENT: No facial asymmetry, EOMI,     Neck supple . ? ?Chest: Clear to auscultation bilaterally. ? ?CVS: S1, S2 no murmurs, no S3.Regular rate. ? ?ABD: Soft non tender.  ? ?Ext: No edema ? ?MS: Adequate ROM spine, shoulders, hips and knees. ? ?Skin: Intact, no ulcerations or rash noted. ? ?Psych: Good eye contact, normal affect. Memory intact not anxious or depressed appearing. ? ?CNS: CN 2-12 intact, power,  normal throughout.no focal deficits noted. ? ? ?Assessment & Plan ? ?Malignant hypertension ?Uncontrolled, non compliance an issue ?DASH diet and commitment to daily physical activity for a minimum of 30 minutes discussed  and encouraged, as a part of hypertension management. ?The importance of attaining a healthy weight is also discussed. ? ? ?  03/08/2022  ?  8:36 AM 11/08/2021  ? 11:52 AM 06/14/2021  ?  2:20 PM 04/26/2021  ?  8:49 AM 12/01/2020  ?  8:27 AM 11/10/2020  ?  9:14 AM 10/15/2020  ?  8:34 AM  ?BP/Weight  ?Systolic BP 440 102 725 366 206 200 205  ?Diastolic BP 440 347 82 425 133 130 135  ?Wt. (Lbs) 223.08 229.04 213.8 216 218  221  ?BMI 37.12 kg/m2 38.11 kg/m2 35.58 kg/m2 35.94 kg/m2 36.28 kg/m2  36.78 kg/m2  ? ? ? ? ? ?Iron deficiency anemia ?Needs to commit to twice daily iron 325 mg if able, also needs ot  Address excess bleeding with Gyne ? ?Flooding during periods ?Flooding with clots and IDA, gyne eval ? ?Diabetes mellitus with coincident hypertension ([place]) ?Tracy Joseph is reminded of the importance of commitment to daily physical activity for 30 minutes or more, as able and the need to limit carbohydrate intake to 30 to 60 grams per meal to help with blood sugar control.  ?Diet controlled, which is good ? ?Tracy Joseph is reminded of the importance of daily foot exam, annual eye examination, and good blood sugar, blood pressure and cholesterol control. ? ? ?  Latest Ref Rng & Units 03/08/2022  ?  9:30 AM 03/08/2022  ?  9:19 AM 04/26/2021  ?  9:46 AM 04/26/2021  ?  9:37 AM 11/10/2020  ?  9:38 AM  ?  Diabetic Labs  ?HbA1c 5.7 - 6.4 %  6.1   5.9      ?Chol 100 - 199 mg/dL 214     179     ?HDL >39 mg/dL 50     49     ?Calc LDL 0 - 99 mg/dL 140     112     ?Triglycerides 0 - 149 mg/dL 135     101     ?Creatinine 0.57 - 1.00 mg/dL 1.45     1.31   1.34    ? ? ?  03/08/2022  ?  8:36 AM 11/08/2021  ? 11:52 AM 06/14/2021  ?  2:20 PM 04/26/2021  ?  8:49 AM 12/01/2020  ?  8:27 AM 11/10/2020  ?  9:14 AM 10/15/2020  ?  8:34 AM  ?BP/Weight  ?Systolic BP 638 466 599 357 206 200 205  ?Diastolic BP 017 793 82 903 133 130 135  ?Wt. (Lbs) 223.08 229.04 213.8 216 218  221  ?BMI 37.12 kg/m2 38.11 kg/m2 35.58 kg/m2 35.94 kg/m2 36.28 kg/m2  36.78  kg/m2  ? ? ?  Latest Ref Rng & Units 03/29/2021  ? 12:00 AM 12/01/2020  ?  8:00 AM  ?Foot/eye exam completion dates  ?Eye Exam No Retinopathy No Retinopathy        ?Foot Form Completion   Done  ?  ? This result is from an external source.  ? ? ? ? ? ? ?Hypothyroidism following radioiodine therapy ?Uncorrected, med compliance questionable, will send in Synthroid 175 mcg as before and re check in 6 to 8 weeks ? ?Morbid obesity ([place]) ? ?Patient re-educated about  the importance of commitment to a  minimum of 150 minutes of exercise per week as able. ? ?The importance of healthy food choices with portion control discussed, as well as eating regularly and within a 12 hour window most days. ?The need to choose "clean , green" food 50 to 75% of the time is discussed, as well as to make water the primary drink and set a goal of 64 ounces water daily. ? ?  ? ?  03/08/2022  ?  8:36 AM 11/08/2021  ? 11:52 AM 06/14/2021  ?  2:20 PM  ?Weight /BMI  ?Weight 223 lb 1.3 oz 229 lb 0.6 oz 213 lb 12.8 oz  ?Height '5\' 5"'$  (1.651 m) '5\' 5"'$  (1.651 m) '5\' 5"'$  (1.651 m)  ?BMI 37.12 kg/m2 38.11 kg/m2 35.58 kg/m2  ? ? ? ? ?Vitamin D deficiency ?Uncorrected needs weekly vit d ? ?

## 2022-03-12 NOTE — Assessment & Plan Note (Signed)
?  Patient re-educated about  the importance of commitment to a  minimum of 150 minutes of exercise per week as able. ? ?The importance of healthy food choices with portion control discussed, as well as eating regularly and within a 12 hour window most days. ?The need to choose "clean , green" food 50 to 75% of the time is discussed, as well as to make water the primary drink and set a goal of 64 ounces water daily. ? ?  ? ?  03/08/2022  ?  8:36 AM 11/08/2021  ? 11:52 AM 06/14/2021  ?  2:20 PM  ?Weight /BMI  ?Weight 223 lb 1.3 oz 229 lb 0.6 oz 213 lb 12.8 oz  ?Height '5\' 5"'$  (1.651 m) '5\' 5"'$  (1.651 m) '5\' 5"'$  (1.651 m)  ?BMI 37.12 kg/m2 38.11 kg/m2 35.58 kg/m2  ? ? ? ?

## 2022-03-12 NOTE — Assessment & Plan Note (Addendum)
Ms. Zadrozny is reminded of the importance of commitment to daily physical activity for 30 minutes or more, as able and the need to limit carbohydrate intake to 30 to 60 grams per meal to help with blood sugar control.  ?Diet controlled, which is good ? ?Ms. Derringer is reminded of the importance of daily foot exam, annual eye examination, and good blood sugar, blood pressure and cholesterol control. ? ? ?  Latest Ref Rng & Units 03/08/2022  ?  9:30 AM 03/08/2022  ?  9:19 AM 04/26/2021  ?  9:46 AM 04/26/2021  ?  9:37 AM 11/10/2020  ?  9:38 AM  ?Diabetic Labs  ?HbA1c 5.7 - 6.4 %  6.1   5.9      ?Chol 100 - 199 mg/dL 214     179     ?HDL >39 mg/dL 50     49     ?Calc LDL 0 - 99 mg/dL 140     112     ?Triglycerides 0 - 149 mg/dL 135     101     ?Creatinine 0.57 - 1.00 mg/dL 1.45     1.31   1.34    ? ? ?  03/08/2022  ?  8:36 AM 11/08/2021  ? 11:52 AM 06/14/2021  ?  2:20 PM 04/26/2021  ?  8:49 AM 12/01/2020  ?  8:27 AM 11/10/2020  ?  9:14 AM 10/15/2020  ?  8:34 AM  ?BP/Weight  ?Systolic BP 295 188 416 606 206 200 205  ?Diastolic BP 301 601 82 093 133 130 135  ?Wt. (Lbs) 223.08 229.04 213.8 216 218  221  ?BMI 37.12 kg/m2 38.11 kg/m2 35.58 kg/m2 35.94 kg/m2 36.28 kg/m2  36.78 kg/m2  ? ? ?  Latest Ref Rng & Units 03/29/2021  ? 12:00 AM 12/01/2020  ?  8:00 AM  ?Foot/eye exam completion dates  ?Eye Exam No Retinopathy No Retinopathy        ?Foot Form Completion   Done  ?  ? This result is from an external source.  ? ? ? ? ? ?

## 2022-03-12 NOTE — Assessment & Plan Note (Signed)
Uncontrolled, non compliance an issue ?DASH diet and commitment to daily physical activity for a minimum of 30 minutes discussed and encouraged, as a part of hypertension management. ?The importance of attaining a healthy weight is also discussed. ? ? ?  03/08/2022  ?  8:36 AM 11/08/2021  ? 11:52 AM 06/14/2021  ?  2:20 PM 04/26/2021  ?  8:49 AM 12/01/2020  ?  8:27 AM 11/10/2020  ?  9:14 AM 10/15/2020  ?  8:34 AM  ?BP/Weight  ?Systolic BP 364 383 779 396 206 200 205  ?Diastolic BP 886 484 82 720 133 130 135  ?Wt. (Lbs) 223.08 229.04 213.8 216 218  221  ?BMI 37.12 kg/m2 38.11 kg/m2 35.58 kg/m2 35.94 kg/m2 36.28 kg/m2  36.78 kg/m2  ? ? ? ? ?

## 2022-03-13 ENCOUNTER — Other Ambulatory Visit: Payer: Self-pay

## 2022-03-13 DIAGNOSIS — R768 Other specified abnormal immunological findings in serum: Secondary | ICD-10-CM

## 2022-03-15 LAB — SPECIMEN STATUS REPORT

## 2022-03-18 ENCOUNTER — Other Ambulatory Visit: Payer: Self-pay | Admitting: Family Medicine

## 2022-03-22 LAB — HCV RNA QUANT RFLX ULTRA OR GENOTYP

## 2022-03-29 ENCOUNTER — Ambulatory Visit (INDEPENDENT_AMBULATORY_CARE_PROVIDER_SITE_OTHER): Payer: Managed Care, Other (non HMO) | Admitting: Obstetrics & Gynecology

## 2022-03-29 ENCOUNTER — Encounter: Payer: Self-pay | Admitting: Obstetrics & Gynecology

## 2022-03-29 VITALS — BP 229/158 | HR 89 | Ht 65.0 in | Wt 222.8 lb

## 2022-03-29 DIAGNOSIS — D500 Iron deficiency anemia secondary to blood loss (chronic): Secondary | ICD-10-CM

## 2022-03-29 DIAGNOSIS — N920 Excessive and frequent menstruation with regular cycle: Secondary | ICD-10-CM | POA: Diagnosis not present

## 2022-03-29 DIAGNOSIS — I10 Essential (primary) hypertension: Secondary | ICD-10-CM

## 2022-03-29 MED ORDER — NORETHINDRONE ACETATE 5 MG PO TABS: 5.0000 mg | ORAL_TABLET | Freq: Every day | ORAL | 4 refills | Status: DC

## 2022-03-29 NOTE — Progress Notes (Signed)
? ?GYN VISIT ?Patient name: Tracy Joseph MRN [id number]  Date of birth: 1971-09-01 ?Chief Complaint:   ?Menstrual Problem ? ?History of Present Illness:   ?Tracy Joseph is a 51 y.o. [phone] female for consultaiton from Dr. Moshe Cipro due to AUB. ? ?AUB: She notes that this has been going on for the past few years.  Typically menses last for 7-10 days.  Starts slow then gets heavy- heavy for about 5 days.  Super plus pad 4-5x per day.  Typically using 2 every few hours to prevent an accident  Also notes plum-sized clots.  Denies intermenstrual bleeding.  Minimal dysmenorrhea.    ? ?Bleeding has led to iron def. Anemia- lab work reviewed.  On oral supplementation ? ?Notes some vaginal dryness- improved with OTC lubricants. ? ?Contraception: tubal ligation ?Prior NSVD x 2 ? ?Patient's last menstrual period was 03/06/2022 (approximate). ? ? ?  03/29/2022  ?  9:03 AM 03/08/2022  ?  8:38 AM 11/08/2021  ? 11:52 AM 04/26/2021  ?  9:12 AM 12/01/2020  ?  8:28 AM  ?Depression screen PHQ 2/9  ?Decreased Interest 0 0 0 0 0  ?Down, Depressed, Hopeless 0 0 0 0 0  ?PHQ - 2 Score 0 0 0 0 0  ?Altered sleeping 0  0    ?Tired, decreased energy 0  0    ?Change in appetite 0  0    ?Feeling bad or failure about yourself  0  0    ?Trouble concentrating 0  0    ?Moving slowly or fidgety/restless 0  0    ?Suicidal thoughts 0  0    ?PHQ-9 Score 0  0    ? ? ? ?Review of Systems:   ?Pertinent items are noted in HPI ?Denies fever/chills, dizziness, headaches, visual disturbances, fatigue, shortness of breath, chest pain, abdominal pain, occasional nausea, denies issues with vomiting, bowel movements, urination, or intercourse unless otherwise stated above.  ?Pertinent History Reviewed:  ?Reviewed past medical,surgical, social, obstetrical and family history.  ?Reviewed problem list, medications and allergies. ?Physical Assessment:  ? ?Vitals:  ? 03/29/22 0904  ?BP: (!) 229/158  ?Pulse: 89  ?Weight: 222 lb 12.8 oz (101.1 kg)  ?Height: '5\' 5"'$   (1.651 m)  ?Body mass index is 37.08 kg/m?. ? ?     Physical Examination:  ? General appearance: alert, well appearing, and in no distress ? Psych: mood appropriate, normal affect ? Skin: warm & dry  ? Cardiovascular: RRR ? Respiratory: CTAB ? Abdomen: soft, non-tender  ? Pelvic: VULVA: normal appearing vulva with no masses, tenderness or lesions, VAGINA: normal appearing vagina with normal color and discharge, no lesions, CERVIX: normal appearing cervix without discharge or lesions, UTERUS: uterus is normal size, shape, consistency and nontender, ADNEXA: normal adnexa in size, nontender and no masses ? Extremities: no edema, no calf tenderness bilaterally ? ?Chaperone: Glenard Haring [place]   ? ?Assessment & Plan:  ?1) HMB ?-plan for pelvic US to r/o underlying etiology, enlarged uterus not appreciated on exam ?-discussed all options ranging from conservative medication to surgical intervention such as ablation or IUD ?-due to hypertension not a candidate for combined therapy ?-after discussion of each option- pt wishes trial of POPs ?-f/u in 64mo ? ?2) iron def. Anemia ?-continue on oral supplementation ?-may consider recheck in 670moif bleeding improves with above medication ?-discussed HMB as factor in her anemia ? ?3) HTN ?-discussed importance of continued use of medication and how her BP influences are options for medical  management as above ? ? ?Meds ordered this encounter  ?Medications  ? norethindrone (AYGESTIN) 5 MG tablet  ?  Sig: Take 1 tablet (5 mg total) by mouth daily.  ?  Dispense:  90 tablet  ?  Refill:  4  ? ? ?Return in about 3 months (around 06/28/2022) for with Dr. Nelda Marseille annual/follow up AND pelvic US next available ( in 2wks). ? ? ?[name], DO ?[facility]ending [place] ? ? ? ?

## 2022-03-31 ENCOUNTER — Telehealth: Payer: Self-pay | Admitting: Family Medicine

## 2022-03-31 ENCOUNTER — Encounter: Payer: Self-pay | Admitting: Family Medicine

## 2022-03-31 NOTE — Telephone Encounter (Signed)
Please mail a letter to [facility] , asking that she contact the office because of abnormal lab results. I also have tried calling her on the 2 available numbers, and cannot leave a message and she does not answer ? ?

## 2022-03-31 NOTE — Telephone Encounter (Signed)
Letter mailed

## 2022-04-05 ENCOUNTER — Telehealth: Payer: Self-pay | Admitting: Family Medicine

## 2022-04-05 DIAGNOSIS — B1920 Unspecified viral hepatitis C without hepatic coma: Secondary | ICD-10-CM

## 2022-04-05 MED ORDER — LEVOTHYROXINE SODIUM 175 MCG PO TABS: ORAL_TABLET | ORAL | 3 refills | Status: DC

## 2022-04-05 NOTE — Telephone Encounter (Signed)
I SPOKE DIRECTLY WITH PT, SHE IS AWARE OF POS HEP[ c SCREEN AND REFERRAL TO gi ?wAS NOT TAKING THYROID MED, AHS RTESTARTED, STILL TAKING INCORRECTLY, DIRECTIONS REVIEWED WITH HER ?wILL TAKE otc VIT d3, 2 DAILY, 73532 Iu EACH ?WILL START Otc ONCE DAILY IRON ?

## 2022-04-06 ENCOUNTER — Encounter (INDEPENDENT_AMBULATORY_CARE_PROVIDER_SITE_OTHER): Payer: Self-pay | Admitting: *Deleted

## 2022-04-12 ENCOUNTER — Other Ambulatory Visit: Payer: Self-pay | Admitting: Obstetrics & Gynecology

## 2022-04-12 DIAGNOSIS — N939 Abnormal uterine and vaginal bleeding, unspecified: Secondary | ICD-10-CM

## 2022-04-13 ENCOUNTER — Other Ambulatory Visit: Payer: Managed Care, Other (non HMO)

## 2022-05-04 ENCOUNTER — Encounter (HOSPITAL_COMMUNITY): Payer: Self-pay | Admitting: Emergency Medicine

## 2022-05-04 ENCOUNTER — Emergency Department (HOSPITAL_COMMUNITY): Payer: Managed Care, Other (non HMO)

## 2022-05-04 ENCOUNTER — Other Ambulatory Visit: Payer: Self-pay

## 2022-05-04 ENCOUNTER — Inpatient Hospital Stay (HOSPITAL_COMMUNITY): Payer: Managed Care, Other (non HMO)

## 2022-05-04 ENCOUNTER — Inpatient Hospital Stay (HOSPITAL_COMMUNITY)
Admission: EM | Admit: 2022-05-04 | Discharge: 2022-05-07 | DRG: 291 | Disposition: A | Discharge status: Home / Self Care | Payer: Managed Care, Other (non HMO) | Attending: Family Medicine | Admitting: Family Medicine

## 2022-05-04 DIAGNOSIS — D509 Iron deficiency anemia, unspecified: Secondary | ICD-10-CM | POA: Diagnosis present

## 2022-05-04 DIAGNOSIS — I5043 Acute on chronic combined systolic (congestive) and diastolic (congestive) heart failure: Secondary | ICD-10-CM | POA: Diagnosis present

## 2022-05-04 DIAGNOSIS — I169 Hypertensive crisis, unspecified: Secondary | ICD-10-CM | POA: Diagnosis present

## 2022-05-04 DIAGNOSIS — G4733 Obstructive sleep apnea (adult) (pediatric): Secondary | ICD-10-CM | POA: Diagnosis present

## 2022-05-04 DIAGNOSIS — Z79899 Other long term (current) drug therapy: Secondary | ICD-10-CM | POA: Diagnosis not present

## 2022-05-04 DIAGNOSIS — R7989 Other specified abnormal findings of blood chemistry: Secondary | ICD-10-CM

## 2022-05-04 DIAGNOSIS — I10 Essential (primary) hypertension: Secondary | ICD-10-CM | POA: Diagnosis not present

## 2022-05-04 DIAGNOSIS — Z8249 Family history of ischemic heart disease and other diseases of the circulatory system: Secondary | ICD-10-CM | POA: Diagnosis not present

## 2022-05-04 DIAGNOSIS — I160 Hypertensive urgency: Secondary | ICD-10-CM

## 2022-05-04 DIAGNOSIS — Z91048 Other nonmedicinal substance allergy status: Secondary | ICD-10-CM

## 2022-05-04 DIAGNOSIS — I5040 Unspecified combined systolic (congestive) and diastolic (congestive) heart failure: Secondary | ICD-10-CM | POA: Diagnosis not present

## 2022-05-04 DIAGNOSIS — E039 Hypothyroidism, unspecified: Secondary | ICD-10-CM | POA: Diagnosis present

## 2022-05-04 DIAGNOSIS — Z888 Allergy status to other drugs, medicaments and biological substances status: Secondary | ICD-10-CM

## 2022-05-04 DIAGNOSIS — I130 Hypertensive heart and chronic kidney disease with heart failure and stage 1 through stage 4 chronic kidney disease, or unspecified chronic kidney disease: Principal | ICD-10-CM | POA: Diagnosis present

## 2022-05-04 DIAGNOSIS — E876 Hypokalemia: Secondary | ICD-10-CM | POA: Diagnosis present

## 2022-05-04 DIAGNOSIS — Z7982 Long term (current) use of aspirin: Secondary | ICD-10-CM

## 2022-05-04 DIAGNOSIS — I161 Hypertensive emergency: Secondary | ICD-10-CM | POA: Diagnosis present

## 2022-05-04 DIAGNOSIS — Z79890 Hormone replacement therapy (postmenopausal): Secondary | ICD-10-CM

## 2022-05-04 DIAGNOSIS — I509 Heart failure, unspecified: Secondary | ICD-10-CM

## 2022-05-04 DIAGNOSIS — Z6837 Body mass index (BMI) 37.0-37.9, adult: Secondary | ICD-10-CM

## 2022-05-04 DIAGNOSIS — I5042 Chronic combined systolic (congestive) and diastolic (congestive) heart failure: Secondary | ICD-10-CM

## 2022-05-04 DIAGNOSIS — J302 Other seasonal allergic rhinitis: Secondary | ICD-10-CM | POA: Diagnosis present

## 2022-05-04 DIAGNOSIS — R778 Other specified abnormalities of plasma proteins: Secondary | ICD-10-CM | POA: Diagnosis not present

## 2022-05-04 DIAGNOSIS — N179 Acute kidney failure, unspecified: Secondary | ICD-10-CM | POA: Diagnosis present

## 2022-05-04 DIAGNOSIS — E669 Obesity, unspecified: Secondary | ICD-10-CM | POA: Diagnosis present

## 2022-05-04 DIAGNOSIS — N920 Excessive and frequent menstruation with regular cycle: Secondary | ICD-10-CM | POA: Diagnosis present

## 2022-05-04 DIAGNOSIS — I248 Other forms of acute ischemic heart disease: Secondary | ICD-10-CM | POA: Diagnosis present

## 2022-05-04 DIAGNOSIS — N1832 Chronic kidney disease, stage 3b: Secondary | ICD-10-CM | POA: Diagnosis present

## 2022-05-04 HISTORY — DX: Chronic combined systolic (congestive) and diastolic (congestive) heart failure: I50.42

## 2022-05-04 LAB — BASIC METABOLIC PANEL
Anion gap: 6 (ref 5–15)
BUN: 16 mg/dL (ref 6–20)
CO2: 25 mmol/L (ref 22–32)
Calcium: 8.6 mg/dL — ABNORMAL LOW (ref 8.9–10.3)
Chloride: 106 mmol/L (ref 98–111)
Creatinine, Ser: 1.41 mg/dL — ABNORMAL HIGH (ref 0.44–1.00)
GFR, Estimated: 45 mL/min — ABNORMAL LOW (ref >60)
Glucose, Bld: 118 mg/dL — ABNORMAL HIGH (ref 70–99)
Potassium: 3.1 mmol/L — ABNORMAL LOW (ref 3.5–5.1)
Sodium: 137 mmol/L (ref 135–145)

## 2022-05-04 LAB — CBC
HCT: 28.2 % — ABNORMAL LOW (ref 36.0–46.0)
Hemoglobin: 8.3 g/dL — ABNORMAL LOW (ref 12.0–15.0)
MCH: 22.9 pg — ABNORMAL LOW (ref 26.0–34.0)
MCHC: 29.4 g/dL — ABNORMAL LOW (ref 30.0–36.0)
MCV: 77.7 fL — ABNORMAL LOW (ref 80.0–100.0)
Platelets: 341 10*3/uL (ref 150–400)
RBC: 3.63 MIL/uL — ABNORMAL LOW (ref 3.87–5.11)
RDW: 18.6 % — ABNORMAL HIGH (ref 11.5–15.5)
WBC: 8 10*3/uL (ref 4.0–10.5)
nRBC: 0 % (ref 0.0–0.2)

## 2022-05-04 LAB — PHOSPHORUS: Phosphorus: 3.2 mg/dL (ref 2.5–4.6)

## 2022-05-04 LAB — ECHOCARDIOGRAM COMPLETE
AR max vel: 2.1 cm2
AV Area VTI: 2.12 cm2
AV Area mean vel: 2.13 cm2
AV Mean grad: 6 mmHg
AV Peak grad: 10.6 mmHg
Ao pk vel: 1.63 m/s
Area-P 1/2: 4.04 cm2
Calc EF: 46.1 %
Height: 65 in
MV VTI: 2.11 cm2
S' Lateral: 3.7 cm
Single Plane A2C EF: 51.1 %
Single Plane A4C EF: 39.9 %
Weight: 3568 oz

## 2022-05-04 LAB — IRON AND TIBC
Iron: 14 ug/dL — ABNORMAL LOW (ref 28–170)
Saturation Ratios: 3 % — ABNORMAL LOW (ref 10.4–31.8)
TIBC: 421 ug/dL (ref 250–450)
UIBC: 407 ug/dL

## 2022-05-04 LAB — TROPONIN I (HIGH SENSITIVITY)
Troponin I (High Sensitivity): 33 ng/L — ABNORMAL HIGH (ref <18)
Troponin I (High Sensitivity): 35 ng/L — ABNORMAL HIGH (ref <18)

## 2022-05-04 LAB — BRAIN NATRIURETIC PEPTIDE: B Natriuretic Peptide: 140 pg/mL — ABNORMAL HIGH (ref 0.0–100.0)

## 2022-05-04 LAB — HIV ANTIBODY (ROUTINE TESTING W REFLEX): HIV Screen 4th Generation wRfx: NONREACTIVE

## 2022-05-04 LAB — POC URINE PREG, ED: Preg Test, Ur: NEGATIVE

## 2022-05-04 LAB — MAGNESIUM: Magnesium: 2.1 mg/dL (ref 1.7–2.4)

## 2022-05-04 LAB — FERRITIN: Ferritin: 5 ng/mL — ABNORMAL LOW (ref 11–307)

## 2022-05-04 MED ORDER — SODIUM CHLORIDE 0.9 % IV SOLN
510.0000 mg | Freq: Once | INTRAVENOUS | Status: AC
  Administered 2022-05-04: 510 mg via INTRAVENOUS
  Filled 2022-05-04: qty 17

## 2022-05-04 MED ORDER — NITROGLYCERIN 2 % TD OINT
1.0000 [in_us] | TOPICAL_OINTMENT | Freq: Once | TRANSDERMAL | Status: AC
  Administered 2022-05-04: 1 [in_us] via TOPICAL
  Filled 2022-05-04: qty 1

## 2022-05-04 MED ORDER — LABETALOL HCL 200 MG PO TABS
300.0000 mg | ORAL_TABLET | Freq: Two times a day (BID) | ORAL | Status: DC
  Administered 2022-05-04 – 2022-05-07 (×7): 300 mg via ORAL
  Filled 2022-05-04 (×8): qty 2

## 2022-05-04 MED ORDER — ACETAMINOPHEN 325 MG PO TABS: 650.0000 mg | ORAL_TABLET | Freq: Four times a day (QID) | ORAL | Status: DC | PRN

## 2022-05-04 MED ORDER — ENOXAPARIN SODIUM 40 MG/0.4ML IJ SOSY
40.0000 mg | PREFILLED_SYRINGE | INTRAMUSCULAR | Status: DC
  Administered 2022-05-04 – 2022-05-07 (×4): 40 mg via SUBCUTANEOUS
  Filled 2022-05-04 (×4): qty 0.4

## 2022-05-04 MED ORDER — HYDRALAZINE HCL 25 MG PO TABS
100.0000 mg | ORAL_TABLET | Freq: Three times a day (TID) | ORAL | Status: DC
  Administered 2022-05-04 – 2022-05-07 (×10): 100 mg via ORAL
  Filled 2022-05-04 (×10): qty 4

## 2022-05-04 MED ORDER — POTASSIUM CHLORIDE CRYS ER 20 MEQ PO TBCR
40.0000 meq | EXTENDED_RELEASE_TABLET | Freq: Once | ORAL | Status: AC
  Administered 2022-05-04: 40 meq via ORAL
  Filled 2022-05-04: qty 2

## 2022-05-04 MED ORDER — LIVING BETTER WITH HEART FAILURE BOOK: Freq: Once | Status: DC

## 2022-05-04 MED ORDER — CLONIDINE HCL 0.2 MG PO TABS
0.2000 mg | ORAL_TABLET | Freq: Two times a day (BID) | ORAL | Status: DC
Start: 2022-05-04 — End: 2022-05-04

## 2022-05-04 MED ORDER — CHLORHEXIDINE GLUCONATE CLOTH 2 % EX PADS
6.0000 | MEDICATED_PAD | Freq: Every day | CUTANEOUS | Status: DC
  Administered 2022-05-04 – 2022-05-07 (×4): 6 via TOPICAL

## 2022-05-04 MED ORDER — FUROSEMIDE 10 MG/ML IJ SOLN
40.0000 mg | Freq: Two times a day (BID) | INTRAMUSCULAR | Status: DC
  Administered 2022-05-04 – 2022-05-07 (×6): 40 mg via INTRAVENOUS
  Filled 2022-05-04 (×7): qty 4

## 2022-05-04 MED ORDER — HYDRALAZINE HCL 25 MG PO TABS: 50.0000 mg | ORAL_TABLET | Freq: Three times a day (TID) | ORAL | Status: DC

## 2022-05-04 MED ORDER — ONDANSETRON HCL 4 MG/2ML IJ SOLN
4.0000 mg | Freq: Four times a day (QID) | INTRAMUSCULAR | Status: DC | PRN
  Administered 2022-05-04: 4 mg via INTRAVENOUS
  Filled 2022-05-04: qty 2

## 2022-05-04 MED ORDER — FUROSEMIDE 10 MG/ML IJ SOLN
40.0000 mg | Freq: Once | INTRAMUSCULAR | Status: AC
  Administered 2022-05-04: 40 mg via INTRAVENOUS
  Filled 2022-05-04: qty 4

## 2022-05-04 MED ORDER — NITROGLYCERIN IN D5W 200-5 MCG/ML-% IV SOLN
5.0000 ug/min | INTRAVENOUS | Status: DC
  Administered 2022-05-04: 200 ug/min via INTRAVENOUS
  Administered 2022-05-04: 170 ug/min via INTRAVENOUS
  Administered 2022-05-04: 200 ug/min via INTRAVENOUS
  Administered 2022-05-04: 5 ug/min via INTRAVENOUS
  Administered 2022-05-05: 180 ug/min via INTRAVENOUS
  Administered 2022-05-05: 190 ug/min via INTRAVENOUS
  Administered 2022-05-05: 160 ug/min via INTRAVENOUS
  Administered 2022-05-05: 190 ug/min via INTRAVENOUS
  Administered 2022-05-05: 195 ug/min via INTRAVENOUS
  Administered 2022-05-05: 180 ug/min via INTRAVENOUS
  Filled 2022-05-04 (×8): qty 250

## 2022-05-04 NOTE — H&P (Signed)
History and Physical    Patient: Tracy Joseph:[id number] DOB: Jul 23, 1971 DOA: 05/04/2022 DOS: the patient was seen and examined on 05/04/2022 PCP: [name], MD  Patient coming from: Home  Chief Complaint:  Chief Complaint  Patient presents with   Chest Pain   HPI: Tracy Joseph is a 51 y.o. female with medical history significant of malignant hypertension, hypothyroidism, obesity who presents to the emergency department due to chest tightness and shortness of breath which has been going on for several days but which worsened tonight.  Patient states that she usually feels chest tightness with difficulty in being able to breathe when she lies flat in bed to sleep and that she has to quickly sit up in order to be able to breathe better.  She states that she has been using more pillows to sleep within the last couple of days.  Patient also complained of bilateral leg swelling and shortness of breath on exertion.  ED Course:  In the emergency department, she was tachypneic, BP was 220/127, other vital signs were within normal range.  Work-up in the ED showed microcytic anemia, hypokalemia, creatinine 1.41 (creatinine is within baseline range), troponin x1 - 33, BNP 140.0. Chest x-ray was suggestive of congestive heart failure Patient was treated with IV Lasix 40 mg x 1, nitroglycerin ointment was placed and she was started on IV nitroglycerin drip.  Hospitalist was asked to admit patient for further evaluation and management.  Review of Systems: Review of systems as noted in the HPI. All other systems reviewed and are negative.   Past Medical History:  Diagnosis Date   Essential hypertension    Goiter    Initially hyperthyroid treated with RAI, now hypothyroid - follows with Dr. [name]   Hypertension    Phreesia 10/12/2020   Obstructive sleep apnea    Suspected    Prediabetes    Seasonal allergies    Tubular adenoma of colon    Past Surgical History:  Procedure  Laterality Date   COLONOSCOPY N/A 05/03/2017   Procedure: COLONOSCOPY;  Surgeon: Rogene Houston, MD;  Location: [facility];  Service: Endoscopy;  Laterality: N/A;  930   TUBAL LIGATION  2005    Social History:  reports that she has never smoked. She has never used smokeless tobacco. She reports that she does not drink alcohol and does not use drugs.   Allergies  Allergen Reactions   Ace Inhibitors Cough   Amlodipine Swelling   Dust Mite Extract    Pollen Extract     Family History  Problem Relation Age of Onset   Hypertension Mother    Hyperlipidemia Mother    Thyroid disease Paternal Grandmother    Coronary artery disease Maternal Aunt    Coronary artery disease Maternal Aunt      Prior to Admission medications   Medication Sig Start Date End Date Taking? Authorizing Provider  acetaminophen (TYLENOL) 500 MG tablet Take 1,000 mg by mouth daily as needed for headache. Patient not taking: Reported on 03/29/2022    [provider]  aspirin EC 81 MG tablet Take 81 mg by mouth daily. Swallow whole. Patient not taking: Reported on 03/29/2022    [provider]  cetirizine (ZYRTEC) 10 MG tablet Take 10 mg by mouth daily as needed for allergies.    [provider]  hydrALAZINE (APRESOLINE) 100 MG tablet TAKE 1 TABLET BY MOUTH TWICE A DAY 03/20/22   [name], MD  isosorbide dinitrate (ISORDIL)  20 MG tablet TAKE 1 TABLET BY MOUTH THREE TIMES A DAY 12/19/21   [name], MD  labetalol (NORMODYNE) 300 MG tablet TAKE 1 TABLET BY MOUTH TWICE A DAY 03/20/22   [name], MD  levothyroxine (SYNTHROID) 175 MCG tablet TAKE ONE AND A HALF TABLETS EVERY MONDAY, WEDNESDAY AND FRIDAY, BY MOUTH. TAKE ONE TABLET ONCE DAILY EVERY [facility] Salvia, THURSDAY, SATURDAY AND SUNDAY 07/12/21   [name], MD  levothyroxine (SYNTHROID) 175 MCG tablet Take one and a half tablets every Monday, Wednesday and Friday. And take one tablet by mouth every Tuesday ,  Thursday, Saturday and Sunday, on an empty stomach. 03/12/22   [name], MD  levothyroxine (SYNTHROID) 175 MCG tablet Take one and an half tablets once daily every Monday, Wednesday and Friday, then take one tablet once daily, every Tuesday, Thursday, Saturday and Sunday 04/05/22   [name], MD  norethindrone (AYGESTIN) 5 MG tablet Take 1 tablet (5 mg total) by mouth daily. 03/29/22 06/27/22  [name], DO  sodium chloride (OCEAN) 0.65 % SOLN nasal spray Place 1 spray into both nostrils as needed for congestion.    [provider]  triamterene-hydrochlorothiazide (MAXZIDE) 75-50 MG tablet Take 1 tablet by mouth daily. 04/26/21   [name], MD    Physical Exam: BP (!) 222/143   Pulse 87   Temp 98.9 F (37.2 C) (Oral)   Resp (!) 28   Ht '5\' 5"'$  (1.651 m)   Wt 101.2 kg   SpO2 96%   BMI 37.11 kg/m   General: 51 y.o. year-old female well developed well nourished in no acute distress.  Alert and oriented x3. HEENT: NCAT, EOMI Neck: Supple, trachea medial Cardiovascular: Regular rate and rhythm with no rubs or gallops.  No thyromegaly noted.   2/4 pulses in all 4 extremities. Respiratory: Tachypnea.  Bilateral Rales on auscultation.   Abdomen: Soft, nontender nondistended with normal bowel sounds x4 quadrants. Muskuloskeletal: No cyanosis or clubbing.  Bilateral +2 lower extremity edema to midshin noted  Neuro: CN II-XII intact, strength 5/5 x 4, sensation, reflexes intact Skin: No ulcerative lesions noted or rashes Psychiatry: Judgement and insight appear normal. Mood is appropriate for condition and setting          Labs on Admission:  Basic Metabolic Panel: Recent Labs  Lab 05/04/22 0437  NA 137  K 3.1*  CL 106  CO2 25  GLUCOSE 118*  BUN 16  CREATININE 1.41*  CALCIUM 8.6*   Liver Function Tests: No results for input(s): AST, ALT, ALKPHOS, BILITOT, PROT, ALBUMIN in the last 168 hours. No results for input(s): LIPASE, AMYLASE in the  last 168 hours. No results for input(s): AMMONIA in the last 168 hours. CBC: Recent Labs  Lab 05/04/22 0437  WBC 8.0  HGB 8.3*  HCT 28.2*  MCV 77.7*  PLT 341   Cardiac Enzymes: No results for input(s): CKTOTAL, CKMB, CKMBINDEX, TROPONINI in the last 168 hours.  BNP (last 3 results) Recent Labs    05/04/22 0437  BNP 140.0*    ProBNP (last 3 results) No results for input(s): PROBNP in the last 8760 hours.  CBG: No results for input(s): GLUCAP in the last 168 hours.  Radiological Exams on Admission: DG Chest Portable 1 View  Result Date: 05/04/2022 CLINICAL DATA:  51 year old female with history of chest pain. EXAM: PORTABLE CHEST 1 VIEW COMPARISON:  Chest x-ray 08/26/2004. FINDINGS: There is cephalization of the pulmonary vasculature, indistinctness of the interstitial markings,  and patchy airspace disease throughout the lungs bilaterally suggestive of moderate pulmonary edema. Trace bilateral pleural effusions. No pneumothorax. Mild cardiomegaly. Upper mediastinal contours are within normal limits. IMPRESSION: 1. The appearance the chest is most suggestive of congestive heart failure, as above. 1. Electronically Signed   By: Vinnie Langton M.D.   On: 05/04/2022 05:15    EKG: I independently viewed the EKG done and my findings are as followed: Normal sinus rhythm at a rate of 88 bpm with LVH and QTc 495 ms  Assessment/Plan Present on Admission:  Malignant hypertension  Principal Problem:   CHF (congestive heart failure) (HCC) Active Problems:   Malignant hypertension   Hypertensive emergency   Elevated brain natriuretic peptide (BNP) level   Chronic kidney disease, stage 3b (HCC)   Hypokalemia   Microcytic anemia   Elevated troponin  New onset CHF Elevated BNP-140 Chest x-ray was suggestive of CHF Continue total input/output, daily weights and fluid restriction Continue IV Lasix 40 twice daily Continue Cardiac/carb modified diet  Echocardiogram in the  morning Cardiology will be consulted and we shall await further recommendation  Hypertensive emergency Malignant hypertension Continue IV nitroglycerin with plan to transition to oral meds once BP is better controlled  Hypokalemia K+ is 3.1 K+ will be replenished Please monitor for AM K+ for further replenishmemnt  Microcytic anemia Iron studies will be done  Elevated troponin possible secondary to type II demand ischemia Troponin x 1 - 33; this may be due to type II demand ischemia Patient denies any chest pain Continue to trend troponin  Hypothyroidism Continue Synthroid  CKD stage 3B Creatinine 1.41 (creatinine is within baseline range)   DVT prophylaxis: Lovenox  Code Status: Full code  Consults: Cardiology  Family Communication: Mom at bedside (all questions answered to satisfaction)  Severity of Illness: The appropriate patient status for this patient is INPATIENT. Inpatient status is judged to be reasonable and necessary in order to provide the required intensity of service to ensure the patient's safety. The patient's presenting symptoms, physical exam findings, and initial radiographic and laboratory data in the context of their chronic comorbidities is felt to place them at high risk for further clinical deterioration. Furthermore, it is not anticipated that the patient will be medically stable for discharge from the hospital within 2 midnights of admission.   * I certify that at the point of admission it is my clinical judgment that the patient will require inpatient hospital care spanning beyond 2 midnights from the point of admission due to high intensity of service, high risk for further deterioration and high frequency of surveillance required.*  Author: Bernadette Hoit, DO 05/04/2022 6:51 AM  For on call review [email or web address].

## 2022-05-04 NOTE — ED Notes (Signed)
Pt given breakfast tray. Nurse notified

## 2022-05-04 NOTE — TOC Progression Note (Signed)
  Transition of Care [facility]) Screening Note   Patient Details  Name: YAMILETH HAYSE Date of Birth: 10/08/1971   Transition of Care [facility]) CM/SW Contact:    Boneta Lucks, RN Phone Number: 05/04/2022, 11:30 AM    Transition of Care Department [facility]) has reviewed patient and no TOC needs have been identified at this time. We will continue to monitor patient advancement through interdisciplinary progression rounds. If new patient transition needs arise, please place a TOC consult.  New CHF diagnosis. Living better book ordered for RN to give to patient and Patient to review and ask questions. No home health will accept insurance for RN.     Barriers to Discharge: Continued Medical Work up, No [facility] will accept this patient

## 2022-05-04 NOTE — ED Provider Notes (Signed)
[facility] Provider Note   CSN: [id number] Arrival date & time: 05/04/22  0413     History  Chief Complaint  Patient presents with   Chest Pain    Tracy Joseph is a 51 y.o. female.  Patient is a 51 year old female with PMH of HTN, Hypothyroidism.  She presents today with complaints of tightness in her chest and feeling short of breath.  This woke her from sleep tonight.  She reports similar episodes over the past several days that have occurred when she lies flat and attempts to sleep.  She denies fever or productive cough.  She does describe some slight swelling in both legs.  She denies becoming dyspneic with exertion during the day.  The history is provided by the patient.      Home Medications Prior to Admission medications   Medication Sig Start Date End Date Taking? Authorizing Provider  acetaminophen (TYLENOL) 500 MG tablet Take 1,000 mg by mouth daily as needed for headache. Patient not taking: Reported on 03/29/2022    [provider]  aspirin EC 81 MG tablet Take 81 mg by mouth daily. Swallow whole. Patient not taking: Reported on 03/29/2022    [provider]  cetirizine (ZYRTEC) 10 MG tablet Take 10 mg by mouth daily as needed for allergies.    [provider]  hydrALAZINE (APRESOLINE) 100 MG tablet TAKE 1 TABLET BY MOUTH TWICE A DAY 03/20/22   [name], MD  isosorbide dinitrate (ISORDIL) 20 MG tablet TAKE 1 TABLET BY MOUTH THREE TIMES A DAY 12/19/21   [name], MD  labetalol (NORMODYNE) 300 MG tablet TAKE 1 TABLET BY MOUTH TWICE A DAY 03/20/22   [name], MD  levothyroxine (SYNTHROID) 175 MCG tablet TAKE ONE AND A HALF TABLETS EVERY MONDAY, WEDNESDAY AND FRIDAY, BY MOUTH. TAKE ONE TABLET ONCE DAILY EVERY [facility] Salvia, THURSDAY, SATURDAY AND SUNDAY 07/12/21   [name], MD  levothyroxine (SYNTHROID) 175 MCG tablet Take one and a half tablets every Monday, Wednesday and Friday. And take one  tablet by mouth every Tuesday , Thursday, Saturday and Sunday, on an empty stomach. 03/12/22   [name], MD  levothyroxine (SYNTHROID) 175 MCG tablet Take one and an half tablets once daily every Monday, Wednesday and Friday, then take one tablet once daily, every Tuesday, Thursday, Saturday and Sunday 04/05/22   [name], MD  norethindrone (AYGESTIN) 5 MG tablet Take 1 tablet (5 mg total) by mouth daily. 03/29/22 06/27/22  [name], DO  sodium chloride (OCEAN) 0.65 % SOLN nasal spray Place 1 spray into both nostrils as needed for congestion.    [provider]  triamterene-hydrochlorothiazide (MAXZIDE) 75-50 MG tablet Take 1 tablet by mouth daily. 04/26/21   [name], MD      Allergies    Ace inhibitors, Amlodipine, Dust mite extract, and Pollen extract    Review of Systems   Review of Systems  All other systems reviewed and are negative.  Physical Exam Updated Vital Signs BP (!) 220/127 (BP Location: Right Arm)   Pulse 87   Temp 98.9 F (37.2 C) (Oral)   Resp (!) 34   Ht '5\' 5"'$  (1.651 m)   Wt 101.2 kg   SpO2 93%   BMI 37.11 kg/m  Physical Exam Vitals and nursing note reviewed.  Constitutional:      General: She is not in acute distress.    Appearance: She is well-developed. She is  not diaphoretic.  HENT:     Head: Normocephalic and atraumatic.  Cardiovascular:     Rate and Rhythm: Normal rate and regular rhythm.     Heart sounds: No murmur heard.   No friction rub. No gallop.  Pulmonary:     Effort: Pulmonary effort is normal. No respiratory distress.     Breath sounds: Examination of the right-middle field reveals rales. Examination of the left-middle field reveals rales. Examination of the right-lower field reveals rales. Examination of the left-lower field reveals rales. Rales present. No wheezing.  Abdominal:     General: Bowel sounds are normal. There is no distension.     Palpations: Abdomen is soft.     Tenderness: There is  no abdominal tenderness.  Musculoskeletal:        General: Normal range of motion.     Cervical back: Normal range of motion and neck supple.     Right lower leg: Edema present.     Left lower leg: Edema present.     Comments: There is 1+ pitting edema of both lower extremities.  Skin:    General: Skin is warm and dry.  Neurological:     General: No focal deficit present.     Mental Status: She is alert and oriented to person, place, and time.    ED Results / Procedures / Treatments   Labs (all labs ordered are listed, but only abnormal results are displayed) Labs Reviewed  CBC - Abnormal; Notable for the following components:      Result Value   RBC 3.63 (*)    Hemoglobin 8.3 (*)    HCT 28.2 (*)    MCV 77.7 (*)    MCH 22.9 (*)    MCHC 29.4 (*)    RDW 18.6 (*)    All other components within normal limits  BASIC METABOLIC PANEL  BRAIN NATRIURETIC PEPTIDE  POC URINE PREG, ED  TROPONIN I (HIGH SENSITIVITY)    EKG EKG Interpretation  Date/Time:  Thursday May 04 2022 04:41:05 EDT Ventricular Rate:  88 PR Interval:  173 QRS Duration: 87 QT Interval:  409 QTC Calculation: 495 R Axis:   47 Text Interpretation: Sinus rhythm Left ventricular hypertrophy Anterior Q waves, possibly due to LVH No significant change since 10/12/2020 Confirmed by Veryl Speak [phone]) on 05/04/2022 4:43:35 AM  Radiology No results found.  Procedures Procedures    Medications Ordered in ED Medications - No data to display  ED Course/ Medical Decision Making/ A&P  This patient presents to the ED for concern of shortness of breath and chest tightness, this involves an extensive number of treatment options, and is a complaint that carries with it a high risk of complications and morbidity.  The differential diagnosis includes CHF, acute coronary syndrome, pulmonary embolism   Co morbidities that complicate the patient evaluation  None   Additional history obtained:  No additional  history or external records needed   Lab Tests:  I Ordered, and personally interpreted labs.  The pertinent results include: Mild elevations of both BNP and troponin, and anemia with hemoglobin 8.3, but laboratory studies otherwise unremarkable.   Imaging Studies ordered:  I ordered imaging studies including chest x-ray I independently visualized and interpreted imaging which showed evidence for congestive heart failure I agree with the radiologist interpretation   Cardiac Monitoring: / EKG:  The patient was maintained on a cardiac monitor.  I personally viewed and interpreted the cardiac monitored which showed an underlying rhythm of: Sinus rhythm  Consultations Obtained:  I requested consultation with the the hospitalist,  and discussed lab and imaging findings as well as pertinent plan - they recommend: Admission for management of blood pressure and diuresis   Problem List / ED Course / Critical interventions / Medication management  Patient presenting with complaints of chest tightness and feeling shortness of breath.  This has been worsening, mainly at night for the past 3 nights.  Patient does become somewhat dyspneic with oxygen saturations to 88% with ambulation to the bathroom here in the ER.  Work-up initiated revealing chest x-ray and laboratory studies consistent with new onset of CHF.  Patient's blood pressure was markedly elevated upon presentation and was initially administered nitroglycerin paste.  This was then changed to a nitroglycerin drip as her blood pressures remain markedly elevated.  She was also given 40 mg of IV Lasix for diuresis.  Patient will require admission for likely echocardiogram and cardiology consultation.  I have spoken with Dr. Josephine Cables who agrees to admit. I have reviewed the patients home medicines and have made adjustments as needed   Social Determinants of Health:  None   Test / Admission - Considered:  Patient to be admitted for  further work-up and management of this hypertensive crisis and new onset of CHF.  CRITICAL CARE Performed by: Veryl Speak Total critical care time: 35 minutes Critical care time was exclusive of separately billable procedures and treating other patients. Critical care was necessary to treat or prevent imminent or life-threatening deterioration. Critical care was time spent personally by me on the following activities: development of treatment plan with patient and/or surrogate as well as nursing, discussions with consultants, evaluation of patient's response to treatment, examination of patient, obtaining history from patient or surrogate, ordering and performing treatments and interventions, ordering and review of laboratory studies, ordering and review of radiographic studies, pulse oximetry and re-evaluation of patient's condition.    Final Clinical Impression(s) / ED Diagnoses Final diagnoses:  None    Rx / DC Orders ED Discharge Orders     None         Veryl Speak, MD 05/04/22 [phone]

## 2022-05-04 NOTE — ED Notes (Signed)
Potassium 66mQ and Labetalol '100mg'$  PO was being administered to pt. As soon as she took the medication, she started to feel nauseated. Pt began vomiting, approximately 4079mof yellow colored emesis. Dr. JoBroadus Johnotified. Order given for Zofran '4mg'$  IV to be given. Awaiting medication verification from pharmacy to administer it.

## 2022-05-04 NOTE — ED Notes (Signed)
Nitropaste removed per orders of Dr Stark Jock

## 2022-05-04 NOTE — Progress Notes (Signed)
*  PRELIMINARY RESULTS* Echocardiogram 2D Echocardiogram has been performed.  Tracy Joseph 05/04/2022, 1:46 PM

## 2022-05-04 NOTE — ED Notes (Signed)
Per Dr. Broadus John, pt's SBP goal should be between 160-170 for today, not 100-110 as the Nitro gtt order reads.

## 2022-05-04 NOTE — Progress Notes (Addendum)
Patient seen and examined, admitted earlier this morning by [facility], briefly Mr. Gibbon is a pleasant 51/F with history of malignant hypertension, hypothyroidism, menorrhagia and iron deficiency anemia presented to the ED with chest tightness shortness of breath and orthopnea. -She is on 6 antihypertensives at baseline, recently diagnosed with anemia started on IV iron, saw a gynecologist placed her on norethindrone for menorrhagia, fibroids were ruled out -In the ED blood pressure was 220/140, otherwise lungs were stable, labs noted hemoglobin of 8.3, creatinine of 1.4, chest x-ray was suggestive of CHF, she was placed on a nitroglycerin drip  New onset CHF -Suspect diastolic dysfunction, has LVH on EKG -Continue IV Lasix 40 Mg twice daily today -Check 2D echocardiogram -Cards consulted by admitting MD -Monitor I's/O, daily weights, BMP in a.m.  Malignant hypertension -Started on nitro GGT this morning, heart rate remains significantly elevated despite this -Restart labetalol and hydralazine -Check  renin angiotensin and urine metanephrines -Renal artery Doppler not available at [facility], can be done as outpatient  Hypokalemia -Replaced  CKD 3 A -Likely secondary to hypertension, stable, monitor with diuresis  Severe iron deficiency anemia History of menorrhagia -Will give IV iron this admission, continue follow-up with gynecology, may end up needing a hysterectomy, continue norethindrone  Hypothyroidism -Continue Synthroid  Domenic Polite, MD

## 2022-05-04 NOTE — ED Triage Notes (Signed)
Pt states that when she lays down, she has a feeling of heaviness on her chest which makes it feel like she cannot breathe.

## 2022-05-05 DIAGNOSIS — N1832 Chronic kidney disease, stage 3b: Secondary | ICD-10-CM | POA: Diagnosis not present

## 2022-05-05 DIAGNOSIS — R7989 Other specified abnormal findings of blood chemistry: Secondary | ICD-10-CM | POA: Diagnosis not present

## 2022-05-05 DIAGNOSIS — R778 Other specified abnormalities of plasma proteins: Secondary | ICD-10-CM | POA: Diagnosis not present

## 2022-05-05 DIAGNOSIS — I509 Heart failure, unspecified: Secondary | ICD-10-CM | POA: Diagnosis not present

## 2022-05-05 LAB — APTT: aPTT: 32 seconds (ref 24–36)

## 2022-05-05 LAB — CBC
HCT: 24.8 % — ABNORMAL LOW (ref 36.0–46.0)
Hemoglobin: 7.3 g/dL — ABNORMAL LOW (ref 12.0–15.0)
MCH: 22.7 pg — ABNORMAL LOW (ref 26.0–34.0)
MCHC: 29.4 g/dL — ABNORMAL LOW (ref 30.0–36.0)
MCV: 77 fL — ABNORMAL LOW (ref 80.0–100.0)
Platelets: 341 10*3/uL (ref 150–400)
RBC: 3.22 MIL/uL — ABNORMAL LOW (ref 3.87–5.11)
RDW: 18.6 % — ABNORMAL HIGH (ref 11.5–15.5)
WBC: 11.4 10*3/uL — ABNORMAL HIGH (ref 4.0–10.5)
nRBC: 0 % (ref 0.0–0.2)

## 2022-05-05 LAB — COMPREHENSIVE METABOLIC PANEL
ALT: 7 U/L (ref 0–44)
AST: 10 U/L — ABNORMAL LOW (ref 15–41)
Albumin: 3.4 g/dL — ABNORMAL LOW (ref 3.5–5.0)
Alkaline Phosphatase: 68 U/L (ref 38–126)
Anion gap: 9 (ref 5–15)
BUN: 19 mg/dL (ref 6–20)
CO2: 24 mmol/L (ref 22–32)
Calcium: 8.7 mg/dL — ABNORMAL LOW (ref 8.9–10.3)
Chloride: 103 mmol/L (ref 98–111)
Creatinine, Ser: 1.71 mg/dL — ABNORMAL HIGH (ref 0.44–1.00)
GFR, Estimated: 36 mL/min — ABNORMAL LOW (ref >60)
Glucose, Bld: 105 mg/dL — ABNORMAL HIGH (ref 70–99)
Potassium: 3.2 mmol/L — ABNORMAL LOW (ref 3.5–5.1)
Sodium: 136 mmol/L (ref 135–145)
Total Bilirubin: 0.6 mg/dL (ref 0.3–1.2)
Total Protein: 6.7 g/dL (ref 6.5–8.1)

## 2022-05-05 LAB — MRSA NEXT GEN BY PCR, NASAL: MRSA by PCR Next Gen: NOT DETECTED

## 2022-05-05 MED ORDER — NIFEDIPINE ER OSMOTIC RELEASE 30 MG PO TB24
60.0000 mg | ORAL_TABLET | Freq: Every day | ORAL | Status: DC
  Administered 2022-05-05: 60 mg via ORAL
  Filled 2022-05-05: qty 2

## 2022-05-05 MED ORDER — CLONIDINE HCL 0.2 MG/24HR TD PTWK
0.2000 mg | MEDICATED_PATCH | TRANSDERMAL | Status: DC
  Administered 2022-05-05: 0.2 mg via TRANSDERMAL
  Filled 2022-05-05: qty 1

## 2022-05-05 MED ORDER — POTASSIUM CHLORIDE CRYS ER 20 MEQ PO TBCR
40.0000 meq | EXTENDED_RELEASE_TABLET | ORAL | Status: AC
  Administered 2022-05-05 (×2): 40 meq via ORAL
  Filled 2022-05-05 (×2): qty 2

## 2022-05-05 NOTE — Progress Notes (Signed)
PROGRESS NOTE     Tracy Joseph, is a 51 y.o. 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--Echo from 05/04/2022 with EF of 45 to 44% grade 2 diastolic dysfunction and LVH, no wall motion abnormalities -Mildly elevated troponin probably due to demand ischemia -Continue IV Lasix, -Fluid input and output monitoring  2) hypertensive emergency/underlying malignant hypertension -Despite multiple antihypertensives BP control remains difficult -Patient has had HTN since she was in her early 22s -Outpatient follow-up for further work-up to rule out secondary etiology for HTN strongly encouraged -Aldosterone /renin ordered and pending, metanephrine ordered -Renal artery Dopplers, done as outpatient -Patient reports compliance with antihypertensives -Continues to require IV nitro drip -Also on IV Lasix, hydralazine 100 mg 3 times daily, labetalol 300 mg twice daily, started on nifedipine 60 mg daily, -Added clonidine patch 0.2 mg  3)AKI----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 -Creatinine dropped to 1.71 from 1.4 with diuresis  renally adjust medications, avoid nephrotoxic agents / dehydration  / hypotension  4) acute on chronic anemia--- patient with microcytic/hypochromic iron deficiency anemia in the setting of longstanding menorrhagia -Patient is perimenopausal -Hopefully her menstrual flow with decreased over time -Received iron transfusion on 05/04/2022 -Hgb currently  7.3 =-Consider transfusion especially in view of CHF -Outpatient follow-up with gynecology as advised  5) hypothyroidism--- continue levothyroxine  Disposition/Need for in-Hospital Stay- patient unable to be discharged at this time due to -hypertensive urgency/new onset CHF requiring IV nitro drip, IV Lasix and further BP medication adjustments  Status is: Inpatient   Disposition: The patient is from: Home              Anticipated d/c is to: Home              Anticipated d/c date is: 1 day              Patient currently is not medically stable to d/c. Barriers: Not Clinically Stable-   Code Status :  -  Code Status: Full Code   Family Communication:    (patient is alert, awake and coherent) -discussed with patient's husband by phone  DVT Prophylaxis  :   - SCDs   enoxaparin (LOVENOX) injection 40 mg Start: 05/04/22 0645 SCDs Start: 05/04/22 9675   Lab Results  Component Value Date   PLT 341 05/05/2022    Inpatient Medications  Scheduled Meds:  Chlorhexidine Gluconate Cloth  6 each Topical Daily   enoxaparin (LOVENOX) injection  40 mg Subcutaneous Q24H   furosemide  40 mg Intravenous BID   hydrALAZINE  100 mg Oral TID   labetalol  300 mg Oral BID   Living Better with Heart Failure Book   Does not apply Once   NIFEdipine  60 mg Oral Daily   Continuous Infusions:  nitroGLYCERIN 145 mcg/min (05/05/22 1841)   PRN Meds:.acetaminophen, ondansetron (ZOFRAN) IV   Anti-infectives (From admission, onward)    None         Subjective: Tracy Joseph today has no fevers, no emesis,  No chest pain,   =-Husband on the phone speaker -Questions answered -BP control remains challenging -Patient is pleasant and cooperative  Objective: Vitals:   05/05/22 1500 05/05/22 1531 05/05/22 1600 05/05/22 1700  BP: (!) 153/72  (!) 154/75 134/72  Pulse:      Resp: (!) 34 (!) 31 (!) 30 13  Temp:  99.5 F (37.5 C)    TempSrc:  Oral    SpO2:      Weight:      Height:         Intake/Output Summary (Last 24 hours) at 05/05/2022 1902 Last data filed at 05/05/2022 1836 Gross per 24 hour  Intake 1724.32 ml  Output 1750 ml  Net -25.68 ml   Filed Weights   05/04/22 0427 05/04/22 2020  Weight: 101.2 kg 101.7 kg    Physical Exam  Gen:- Awake Alert,  in no apparent distress  HEENT:- [place].AT, No sclera icterus Neck-Supple Neck,No JVD,.  Lungs-  CTAB , fair symmetrical air movement CV- S1, S2 normal, regular  Abd-  +ve B.Sounds, Abd Soft, No tenderness,    Extremity/Skin:- No  edema, pedal pulses present  Psych-affect is appropriate, oriented x3 Neuro-no new focal deficits, no tremors  Data Reviewed: I have personally reviewed following labs and imaging studies  CBC: Recent Labs  Lab 05/04/22 0437 05/05/22 0324  WBC 8.0 11.4*  HGB 8.3* 7.3*  HCT 28.2* 24.8*  MCV 77.7* 77.0*  PLT 341 818   Basic Metabolic Panel: Recent Labs  Lab 05/04/22 0437 05/05/22 0324  NA 137 136  K 3.1* 3.2*  CL 106 103  CO2 25 24  GLUCOSE 118* 105*  BUN 16 19  CREATININE 1.41* 1.71*  CALCIUM 8.6* 8.7*  MG 2.1  --   PHOS 3.2  --    GFR: Estimated Creatinine Clearance: 45.5 mL/min (A) (by C-G formula based on SCr of 1.71 mg/dL (H)). Liver Function Tests: Recent Labs  Lab 05/05/22 0324  AST 10*  ALT 7  ALKPHOS 68  BILITOT 0.6  PROT 6.7  ALBUMIN 3.4*   Cardiac Enzymes: No results for input(s): CKTOTAL, CKMB, CKMBINDEX, TROPONINI in the last 168 hours. BNP (last 3 results) No results for input(s): PROBNP in the last 8760 hours. HbA1C: No results for input(s): HGBA1C in the last 72 hours. Sepsis Labs: '@LABRCNTIP'$ (procalcitonin:4,lacticidven:4) ) Recent Results (from the past 240 hour(s))  MRSA Next Gen by PCR, Nasal     Status: None   Collection Time: 05/04/22  8:20 PM   Specimen: Nasal Mucosa; Nasal Swab  Result Value Ref Range Status   MRSA by PCR Next Gen NOT DETECTED NOT DETECTED Final    Comment: (NOTE) The GeneXpert MRSA Assay (FDA approved for  NASAL specimens only), is one component of a comprehensive MRSA colonization surveillance program. It is not intended to diagnose MRSA infection nor to guide or monitor treatment for MRSA infections. Test performance is not FDA approved in patients less than 74 years old. Performed at [facility], [address], [place], [place] [address]       Radiology Studies: DG Chest Portable 1 View  Result Date: 05/04/2022 CLINICAL DATA:  51 year old female with history of chest pain. EXAM: PORTABLE CHEST 1 VIEW COMPARISON:  Chest x-ray 08/26/2004. FINDINGS: There is cephalization of the pulmonary vasculature, indistinctness of the interstitial markings, and patchy airspace disease throughout the lungs bilaterally suggestive of moderate pulmonary edema. Trace bilateral pleural effusions. No pneumothorax. Mild cardiomegaly. Upper mediastinal contours are within normal limits. IMPRESSION: 1. The  appearance the chest is most suggestive of congestive heart failure, as above. 1. Electronically Signed   By: Vinnie Langton M.D.   On: 05/04/2022 05:15   ECHOCARDIOGRAM COMPLETE  Result Date: 05/04/2022    ECHOCARDIOGRAM REPORT   Patient Name:   Tracy Joseph Date of Exam: 05/04/2022 Medical Rec #:  [id number]        Height:       65.0 in Accession #:    [id number]       Weight:       223.0 lb Date of Birth:  [birth year]         BSA:          2.072 m Patient Age:    70 years         BP:           161/91 mmHg Patient Gender: F                HR:           81 bpm. Exam Location:  [name] Procedure: 2D Echo, Cardiac Doppler and Color Doppler Indications:    CHF  History:        Patient has prior history of Echocardiogram examinations, most                 recent 08/29/2004. CHF; Risk Factors:Hypertension, Diabetes and                 Dyslipidemia.  Sonographer:    Wenda Low Referring Phys: [id number] OLADAPO ADEFESO IMPRESSIONS  1. Left ventricular ejection fraction, by estimation, is 45 to 50%. The left ventricle  has mildly decreased function. The left ventricle has no regional wall motion abnormalities. The left ventricular internal cavity size was severely dilated. There is severe left ventricular hypertrophy. Left ventricular diastolic parameters are consistent with Grade II diastolic dysfunction (pseudonormalization). Elevated left atrial pressure.  2. Right ventricular systolic function is normal. The right ventricular size is normal.  3. Left atrial size was severely dilated.  4. Right atrial size was mildly dilated.  5. The mitral valve is normal in structure. Mild mitral valve regurgitation.  6. The aortic valve is tricuspid. Aortic valve regurgitation is not visualized. Aortic valve sclerosis is present, with no evidence of aortic valve stenosis.  7. The inferior vena cava is normal in size with greater than 50% respiratory variability, suggesting right atrial pressure of 3 mmHg. FINDINGS  Left Ventricle: Left ventricular ejection fraction, by estimation, is 45 to 50%. The left ventricle has mildly decreased function. The left ventricle has no regional wall motion abnormalities. The left ventricular internal cavity size was severely dilated. There is severe left ventricular hypertrophy. Left ventricular diastolic parameters are consistent with Grade II diastolic dysfunction (pseudonormalization). Elevated left atrial pressure. Right Ventricle: The right ventricular size is normal. Right vetricular wall thickness was not assessed. Right ventricular systolic function is normal. Left Atrium: Left atrial size was severely dilated. Right Atrium: Right atrial size was mildly dilated. Pericardium: There is no evidence of pericardial effusion. Mitral Valve: The mitral valve is normal in structure. Mild mitral valve regurgitation. MV peak gradient, 7.6 mmHg. The mean mitral valve gradient is 3.0 mmHg. Tricuspid Valve: The tricuspid valve is normal in structure. Tricuspid valve regurgitation is trivial. Aortic Valve: The  aortic valve is tricuspid. Aortic valve regurgitation is not visualized. Aortic valve sclerosis is present, with no evidence of aortic valve stenosis. Aortic valve mean gradient measures 6.0 mmHg. Aortic valve peak  gradient measures 10.6 mmHg. Aortic valve area, by VTI measures 2.12 cm. Pulmonic Valve: The pulmonic valve was normal in structure. Pulmonic valve regurgitation is not visualized. Aorta: The aortic root and ascending aorta are structurally normal, with no evidence of dilitation. Venous: The inferior vena cava is normal in size with greater than 50% respiratory variability, suggesting right atrial pressure of 3 mmHg. IAS/Shunts: No atrial level shunt detected by color flow Doppler.  LEFT VENTRICLE PLAX 2D LVIDd:         6.07 cm      Diastology LVIDs:         3.70 cm      LV e' medial:    6.20 cm/s LV PW:         1.83 cm      LV E/e' medial:  13.8 LV IVS:        1.90 cm      LV e' lateral:   5.22 cm/s LVOT diam:     2.00 cm      LV E/e' lateral: 16.4 LV SV:         68 LV SV Index:   33 LVOT Area:     3.14 cm  LV Volumes (MOD) LV vol d, MOD A2C: 108.0 ml LV vol d, MOD A4C: 126.0 ml LV vol s, MOD A2C: 52.8 ml LV vol s, MOD A4C: 75.7 ml LV SV MOD A2C:     55.2 ml LV SV MOD A4C:     126.0 ml LV SV MOD BP:      57.5 ml RIGHT VENTRICLE RV Basal diam:  3.95 cm RV Mid diam:    3.30 cm RV S prime:     15.70 cm/s TAPSE (M-mode): 2.5 cm LEFT ATRIUM              Index        RIGHT ATRIUM           Index LA diam:        4.70 cm  2.27 cm/m   RA Area:     19.70 cm LA Vol (A2C):   110.0 ml 53.09 ml/m  RA Volume:   63.70 ml  30.75 ml/m LA Vol (A4C):   112.0 ml 54.06 ml/m LA Biplane Vol: 115.0 ml 55.51 ml/m  AORTIC VALVE                     PULMONIC VALVE AV Area (Vmax):    2.10 cm      PV Vmax:       1.01 m/s AV Area (Vmean):   2.13 cm      PV Peak grad:  4.1 mmHg AV Area (VTI):     2.12 cm AV Vmax:           163.00 cm/s AV Vmean:          111.000 cm/s AV VTI:            0.319 m AV Peak Grad:      10.6 mmHg AV  Mean Grad:      6.0 mmHg LVOT Vmax:         109.00 cm/s LVOT Vmean:        75.100 cm/s LVOT VTI:          0.215 m LVOT/AV VTI ratio: 0.67  AORTA Ao Root diam: 2.90 cm Ao Asc diam:  3.10 cm MITRAL VALVE MV Area (PHT): 4.04 cm    SHUNTS MV Area VTI:  2.11 cm    Systemic VTI:  0.22 m MV Peak grad:  7.6 mmHg    Systemic Diam: 2.00 cm MV Mean grad:  3.0 mmHg MV Vmax:       1.38 m/s MV Vmean:      84.1 cm/s MV Decel Time: 188 msec MV E velocity: 85.70 cm/s MV A velocity: 97.70 cm/s MV E/A ratio:  0.88 Dorris Carnes MD Electronically signed by Dorris Carnes MD Signature Date/Time: 05/04/2022/9:48:04 PM    Final      Scheduled Meds:  Chlorhexidine Gluconate Cloth  6 each Topical Daily   enoxaparin (LOVENOX) injection  40 mg Subcutaneous Q24H   furosemide  40 mg Intravenous BID   hydrALAZINE  100 mg Oral TID   labetalol  300 mg Oral BID   Living Better with Heart Failure Book   Does not apply Once   NIFEdipine  60 mg Oral Daily   Continuous Infusions:  nitroGLYCERIN 145 mcg/min (05/05/22 1841)     LOS: 1 day    Roxan Hockey M.D on 05/05/2022 at 7:02 PM  Go to [email or web address] - for contact info  [facility] - Office  [phone]  If 7PM-7AM, please contact night-coverage [email or web address] Password [email or web address] 05/05/2022, 7:02 PM

## 2022-05-05 NOTE — Plan of Care (Signed)

## 2022-05-06 DIAGNOSIS — N1832 Chronic kidney disease, stage 3b: Secondary | ICD-10-CM | POA: Diagnosis not present

## 2022-05-06 DIAGNOSIS — R7989 Other specified abnormal findings of blood chemistry: Secondary | ICD-10-CM | POA: Diagnosis not present

## 2022-05-06 DIAGNOSIS — R778 Other specified abnormalities of plasma proteins: Secondary | ICD-10-CM | POA: Diagnosis not present

## 2022-05-06 DIAGNOSIS — I509 Heart failure, unspecified: Secondary | ICD-10-CM | POA: Diagnosis not present

## 2022-05-06 LAB — CBC
HCT: 26.6 % — ABNORMAL LOW (ref 36.0–46.0)
Hemoglobin: 8 g/dL — ABNORMAL LOW (ref 12.0–15.0)
MCH: 22.8 pg — ABNORMAL LOW (ref 26.0–34.0)
MCHC: 30.1 g/dL (ref 30.0–36.0)
MCV: 75.8 fL — ABNORMAL LOW (ref 80.0–100.0)
Platelets: 351 10*3/uL (ref 150–400)
RBC: 3.51 MIL/uL — ABNORMAL LOW (ref 3.87–5.11)
RDW: 18.7 % — ABNORMAL HIGH (ref 11.5–15.5)
WBC: 11.2 10*3/uL — ABNORMAL HIGH (ref 4.0–10.5)
nRBC: 0 % (ref 0.0–0.2)

## 2022-05-06 LAB — RENAL FUNCTION PANEL
Albumin: 3.4 g/dL — ABNORMAL LOW (ref 3.5–5.0)
Anion gap: 8 (ref 5–15)
BUN: 18 mg/dL (ref 6–20)
CO2: 26 mmol/L (ref 22–32)
Calcium: 8.7 mg/dL — ABNORMAL LOW (ref 8.9–10.3)
Chloride: 102 mmol/L (ref 98–111)
Creatinine, Ser: 1.52 mg/dL — ABNORMAL HIGH (ref 0.44–1.00)
GFR, Estimated: 41 mL/min — ABNORMAL LOW (ref >60)
Glucose, Bld: 124 mg/dL — ABNORMAL HIGH (ref 70–99)
Phosphorus: 3.6 mg/dL (ref 2.5–4.6)
Potassium: 3.3 mmol/L — ABNORMAL LOW (ref 3.5–5.1)
Sodium: 136 mmol/L (ref 135–145)

## 2022-05-06 LAB — VITAMIN B12: Vitamin B-12: 212 pg/mL (ref 180–914)

## 2022-05-06 LAB — FOLATE: Folate: 8.6 ng/mL (ref >5.9)

## 2022-05-06 MED ORDER — HYDRALAZINE HCL 20 MG/ML IJ SOLN
10.0000 mg | Freq: Four times a day (QID) | INTRAMUSCULAR | Status: DC | PRN
  Administered 2022-05-06 (×2): 10 mg via INTRAVENOUS
  Filled 2022-05-06: qty 1

## 2022-05-06 MED ORDER — ISOSORBIDE MONONITRATE ER 30 MG PO TB24
30.0000 mg | ORAL_TABLET | Freq: Every day | ORAL | Status: DC
Start: 2022-05-06 — End: 2022-05-07
  Administered 2022-05-06 – 2022-05-07 (×2): 30 mg via ORAL
  Filled 2022-05-06 (×2): qty 1

## 2022-05-06 MED ORDER — LEVOTHYROXINE SODIUM 25 MCG PO TABS: 262.5000 ug | ORAL_TABLET | ORAL | Status: DC

## 2022-05-06 MED ORDER — HYDRALAZINE HCL 20 MG/ML IJ SOLN
INTRAMUSCULAR | Status: AC
Start: 2022-05-06 — End: 2022-05-06
  Filled 2022-05-06: qty 1

## 2022-05-06 MED ORDER — CYANOCOBALAMIN 1000 MCG/ML IJ SOLN
1000.0000 ug | Freq: Once | INTRAMUSCULAR | Status: AC
  Administered 2022-05-06: 1000 ug via INTRAMUSCULAR
  Filled 2022-05-06: qty 1

## 2022-05-06 MED ORDER — AMLODIPINE BESYLATE 5 MG PO TABS
10.0000 mg | ORAL_TABLET | Freq: Every day | ORAL | Status: DC
  Administered 2022-05-06 – 2022-05-07 (×2): 10 mg via ORAL
  Filled 2022-05-06 (×2): qty 2

## 2022-05-06 MED ORDER — LEVOTHYROXINE SODIUM 75 MCG PO TABS: 150.0000 ug | ORAL_TABLET | Freq: Every day | ORAL | Status: DC

## 2022-05-06 MED ORDER — NORETHINDRONE ACETATE 5 MG PO TABS
5.0000 mg | ORAL_TABLET | Freq: Every day | ORAL | Status: DC
  Filled 2022-05-06 (×4): qty 1

## 2022-05-06 MED ORDER — LEVOTHYROXINE SODIUM 75 MCG PO TABS
175.0000 ug | ORAL_TABLET | ORAL | Status: DC
  Administered 2022-05-06 – 2022-05-07 (×2): 175 ug via ORAL
  Filled 2022-05-06 (×2): qty 1

## 2022-05-06 MED ORDER — POTASSIUM CHLORIDE CRYS ER 20 MEQ PO TBCR
40.0000 meq | EXTENDED_RELEASE_TABLET | ORAL | Status: AC
Start: 2022-05-06 — End: 2022-05-06
  Administered 2022-05-06 (×2): 40 meq via ORAL
  Filled 2022-05-06 (×2): qty 2

## 2022-05-06 NOTE — Progress Notes (Signed)
PROGRESS NOTE     Tracy Joseph, is a 51 y.o. female, DOB - 1971/08/19, WUG:[id number]  Admit date - 05/04/2022   Admitting Physician Bernadette Hoit, DO  Outpatient Primary MD for the patient is [name], MD  LOS - 2  Chief Complaint  Patient presents with   Chest Pain        Brief Narrative:  -51/F with history of malignant hypertension, hypothyroidism, menorrhagia and iron deficiency anemia admitted on 05/04/2022 with uncontrolled hypertension/hypertensive emergency and new onset CHF    -Assessment and Plan: 1)Acute on chronic combined systolic and diastolic dysfunction CHF--Echo from 05/04/2022 with EF of 45 to 88% grade 2 diastolic dysfunction and LVH, no wall motion abnormalities -Mildly elevated troponin probably due to demand ischemia -Continue IV Lasix, -Fluid input and output monitoring  Intake/Output Summary (Last 24 hours) at 05/06/2022 1512 Last data filed at 05/06/2022 8280 Gross per 24 hour  Intake 324.16 ml  Output 3450 ml  Net -3125.84 ml    2)Hypertensive Emergency/underlying Malignant Hypertension -Despite multiple antihypertensives BP control remains difficult -Patient has had HTN since she was in her early 57s -Outpatient follow-up for further work-up to rule out secondary etiology for HTN strongly encouraged -Aldosterone /renin ordered and pending, metanephrine ordered -Renal artery Dopplers, done as outpatient -Patient reports compliance with antihypertensives --Blood pressure trending up again after coming off nitro drip -Continue IV Lasix,  C/n po hydralazine 100 mg 3 times daily, labetalol 300 mg twice daily, started on amlodipine 10 mg daily -Okay to add isosorbide now that she is off nitro drip --Continue Clonidine patch 0.2 mg started 05/05/2022 -Patient needs to be worked up for possible secondary causes of hypertension -She was diagnosed with HTN around age 66 and she has been very very resistant stage II HTN -Consider outpatient  referral to endocrinology  3)AKI----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 -Creatinine improving, continue to monitor closely with diuresis  renally adjust medications, avoid nephrotoxic agents / dehydration  / hypotension  4) acute on chronic anemia--- patient with microcytic/hypochromic iron deficiency anemia in the setting of longstanding menorrhagia -Patient is perimenopausal -Hopefully her menstrual flow with decreased over time -Received iron transfusion on 05/04/2022 -Hgb trending back up -B12 is borderline low ok to give B12 shot =-Consider transfusion if hgb is close to 7, especially in view of CHF -Outpatient follow-up with gynecology as advised -Continue Norethindrone hormonal therapy  5) hypothyroidism--- recent TSH was 21, continue levothyroxine -Very difficult to control hypothyroidism consider outpatient referral to endocrinology  6)Hypokalemia--- replace and recheck  Disposition/Need for in-Hospital Stay- patient unable to be discharged at this time due to -hypertensive urgency/new onset CHF requiring IV nitro drip, IV Lasix and further BP medication adjustments -Needs 24-hour metanephrine urine test  Status is: Inpatient   Disposition: The patient is from: Home              Anticipated d/c is to: Home              Anticipated d/c date is: 1 day              Patient currently is not medically stable to d/c. Barriers: Not Clinically Stable-   Code Status :  -  Code Status: Full Code   Family Communication:    (patient is alert, awake and coherent) -discussed with patient's husband by phone  DVT Prophylaxis  :   - SCDs   enoxaparin (LOVENOX) injection 40 mg Start: 05/04/22 0645 SCDs Start: 05/04/22 [id number]  Lab Results  Component Value Date   PLT 351 05/06/2022    Inpatient Medications  Scheduled Meds:  amLODipine  10 mg Oral Daily   Chlorhexidine Gluconate Cloth  6 each Topical Daily   cloNIDine  0.2 mg Transdermal Weekly   enoxaparin (LOVENOX) injection   40 mg Subcutaneous Q24H   furosemide  40 mg Intravenous BID   hydrALAZINE  100 mg Oral TID   isosorbide mononitrate  30 mg Oral Daily   labetalol  300 mg Oral BID   Living Better with Heart Failure Book   Does not apply Once   Continuous Infusions:   PRN Meds:.acetaminophen, hydrALAZINE, ondansetron (ZOFRAN) IV   Anti-infectives (From admission, onward)    None       Subjective: Tracy Joseph today has no fevers, no emesis,  No chest pain,   =-Husband on the phone speaker -Questions answered -Blood pressure trending up again after coming off nitro drip -No headaches or visual disturbance  Objective: Vitals:   05/06/22 0820 05/06/22 0900 05/06/22 1000 05/06/22 1100  BP: (!) 187/95     Pulse:  91 84 86  Resp: (!) 23 (!) 24 (!) 26 18  Temp:    97.8 F (36.6 C)  TempSrc:    Oral  SpO2:  96% 97% 98%  Weight:      Height:        Intake/Output Summary (Last 24 hours) at 05/06/2022 1510 Last data filed at 05/06/2022 0833 Gross per 24 hour  Intake 324.16 ml  Output 3450 ml  Net -3125.84 ml   Filed Weights   05/04/22 0427 05/04/22 2020 05/06/22 0400  Weight: 101.2 kg 101.7 kg 101 kg    Physical Exam  Gen:- Awake Alert,  in no apparent distress  HEENT:- [place].AT, No sclera icterus Neck-Supple Neck,No JVD,.  Lungs-improved air movement, no wheezing  CV- S1, S2 normal, regular  Abd-  +ve B.Sounds, Abd Soft, No tenderness,    Extremity/Skin:- No  edema, pedal pulses present  Psych-affect is appropriate, oriented x3 Neuro-no new focal deficits, no tremors  Data Reviewed: I have personally reviewed following labs and imaging studies  CBC: Recent Labs  Lab 05/04/22 0437 05/05/22 0324 05/06/22 0136  WBC 8.0 11.4* 11.2*  HGB 8.3* 7.3* 8.0*  HCT 28.2* 24.8* 26.6*  MCV 77.7* 77.0* 75.8*  PLT 341 341 676   Basic Metabolic Panel: Recent Labs  Lab 05/04/22 0437 05/05/22 0324 05/06/22 0136  NA 137 136 136  K 3.1* 3.2* 3.3*  CL 106 103 102  CO2 '25 24 26   '$ GLUCOSE 118* 105* 124*  BUN '16 19 18  '$ CREATININE 1.41* 1.71* 1.52*  CALCIUM 8.6* 8.7* 8.7*  MG 2.1  --   --   PHOS 3.2  --  3.6   GFR: Estimated Creatinine Clearance: 51 mL/min (A) (by C-G formula based on SCr of 1.52 mg/dL (H)). Liver Function Tests: Recent Labs  Lab 05/05/22 0324 05/06/22 0136  AST 10*  --   ALT 7  --   ALKPHOS 68  --   BILITOT 0.6  --   PROT 6.7  --   ALBUMIN 3.4* 3.4*   Cardiac Enzymes: No results for input(s): CKTOTAL, CKMB, CKMBINDEX, TROPONINI in the last 168 hours. BNP (last 3 results) No results for input(s): PROBNP in the last 8760 hours. HbA1C: No results for input(s): HGBA1C in the last 72 hours. Sepsis Labs: '@LABRCNTIP'$ (procalcitonin:4,lacticidven:4) ) Recent Results (from the past 240 hour(s))  MRSA Next Gen by PCR, Nasal  Status: None   Collection Time: 05/04/22  8:20 PM   Specimen: Nasal Mucosa; Nasal Swab  Result Value Ref Range Status   MRSA by PCR Next Gen NOT DETECTED NOT DETECTED Final    Comment: (NOTE) The GeneXpert MRSA Assay (FDA approved for NASAL specimens only), is one component of a comprehensive MRSA colonization surveillance program. It is not intended to diagnose MRSA infection nor to guide or monitor treatment for MRSA infections. Test performance is not FDA approved in patients less than 40 years old. Performed at [facility], [address], [place], [place] [address]     Radiology Studies: No results found.  Scheduled Meds:  amLODipine  10 mg Oral Daily   Chlorhexidine Gluconate Cloth  6 each Topical Daily   cloNIDine  0.2 mg Transdermal Weekly   enoxaparin (LOVENOX) injection  40 mg Subcutaneous Q24H   furosemide  40 mg Intravenous BID   hydrALAZINE  100 mg Oral TID   isosorbide mononitrate  30 mg Oral Daily   labetalol  300 mg Oral BID   Living Better with Heart Failure Book   Does not apply Once   Continuous Infusions:    LOS: 2 days    Roxan Hockey M.D on 05/06/2022 at 3:10 PM  Go to  [email or web address] - for contact info  [facility] - Office  [phone]  If 7PM-7AM, please contact night-coverage [email or web address] Password [email or web address] 05/06/2022, 3:10 PM

## 2022-05-06 NOTE — Progress Notes (Signed)
24hr urine needs to be obtained per MD, explained to patient who confirmed understanding of process of collection, urine hat placed in patient toilet, jug obtained from lab & placed on ice, patient reported next 2 voids while having BM & was unable to collect urine in hat, MD ok to start 24hr after AM time, first void collected today @ 4:34PM & started 24hr urine collection

## 2022-05-07 DIAGNOSIS — R7989 Other specified abnormal findings of blood chemistry: Secondary | ICD-10-CM | POA: Diagnosis not present

## 2022-05-07 DIAGNOSIS — N1832 Chronic kidney disease, stage 3b: Secondary | ICD-10-CM | POA: Diagnosis not present

## 2022-05-07 DIAGNOSIS — R778 Other specified abnormalities of plasma proteins: Secondary | ICD-10-CM | POA: Diagnosis not present

## 2022-05-07 DIAGNOSIS — I5040 Unspecified combined systolic (congestive) and diastolic (congestive) heart failure: Secondary | ICD-10-CM | POA: Diagnosis not present

## 2022-05-07 LAB — BASIC METABOLIC PANEL
Anion gap: 7 (ref 5–15)
BUN: 20 mg/dL (ref 6–20)
CO2: 26 mmol/L (ref 22–32)
Calcium: 8.9 mg/dL (ref 8.9–10.3)
Chloride: 104 mmol/L (ref 98–111)
Creatinine, Ser: 1.57 mg/dL — ABNORMAL HIGH (ref 0.44–1.00)
GFR, Estimated: 39 mL/min — ABNORMAL LOW (ref >60)
Glucose, Bld: 115 mg/dL — ABNORMAL HIGH (ref 70–99)
Potassium: 3.2 mmol/L — ABNORMAL LOW (ref 3.5–5.1)
Sodium: 137 mmol/L (ref 135–145)

## 2022-05-07 LAB — CBC
HCT: 28.4 % — ABNORMAL LOW (ref 36.0–46.0)
Hemoglobin: 8.3 g/dL — ABNORMAL LOW (ref 12.0–15.0)
MCH: 22.6 pg — ABNORMAL LOW (ref 26.0–34.0)
MCHC: 29.2 g/dL — ABNORMAL LOW (ref 30.0–36.0)
MCV: 77.2 fL — ABNORMAL LOW (ref 80.0–100.0)
Platelets: 377 10*3/uL (ref 150–400)
RBC: 3.68 MIL/uL — ABNORMAL LOW (ref 3.87–5.11)
RDW: 19 % — ABNORMAL HIGH (ref 11.5–15.5)
WBC: 10.9 10*3/uL — ABNORMAL HIGH (ref 4.0–10.5)
nRBC: 0.2 % (ref 0.0–0.2)

## 2022-05-07 MED ORDER — VITAMIN B-12 1000 MCG PO TABS: 1000.0000 ug | ORAL_TABLET | Freq: Every day | ORAL | 3 refills | Status: AC

## 2022-05-07 MED ORDER — LABETALOL HCL 300 MG PO TABS: 300.0000 mg | ORAL_TABLET | Freq: Two times a day (BID) | ORAL | 3 refills | Status: DC

## 2022-05-07 MED ORDER — HYDRALAZINE HCL 100 MG PO TABS
100.0000 mg | ORAL_TABLET | Freq: Three times a day (TID) | ORAL | 2 refills | Status: DC
Start: 2022-05-07 — End: 2023-04-16

## 2022-05-07 MED ORDER — ISOSORBIDE MONONITRATE ER 60 MG PO TB24: 60.0000 mg | ORAL_TABLET | Freq: Every day | ORAL | 3 refills | Status: DC

## 2022-05-07 MED ORDER — FUROSEMIDE 40 MG PO TABS: 40.0000 mg | ORAL_TABLET | Freq: Every day | ORAL | 3 refills | Status: DC

## 2022-05-07 MED ORDER — AMLODIPINE BESYLATE 10 MG PO TABS: 10.0000 mg | ORAL_TABLET | Freq: Every day | ORAL | 3 refills | Status: DC

## 2022-05-07 MED ORDER — CLONIDINE 0.2 MG/24HR TD PTWK: 0.2000 mg | MEDICATED_PATCH | TRANSDERMAL | 12 refills | Status: DC

## 2022-05-07 MED ORDER — POTASSIUM CHLORIDE CRYS ER 20 MEQ PO TBCR
40.0000 meq | EXTENDED_RELEASE_TABLET | ORAL | Status: AC
  Administered 2022-05-07 (×2): 40 meq via ORAL
  Filled 2022-05-07 (×2): qty 2

## 2022-05-07 MED ORDER — FERROUS SULFATE 325 (65 FE) MG PO TBEC: 325.0000 mg | DELAYED_RELEASE_TABLET | Freq: Every day | ORAL | 3 refills | Status: AC

## 2022-05-07 NOTE — Progress Notes (Signed)
24hr urine collection completed, d/c paperwork & instructions provided to patient & patient's husband, IV catheter removed from patient's RIGHT AC w/o difficulty & no complications noted, patient taken to main entrance via wheelchair by this RN & left with husband

## 2022-05-07 NOTE — Discharge Instructions (Signed)
1)Very low-salt diet advised----less than 2 g of sodium per day 2)Weigh yourself daily, call if you gain more than 3 pounds in 1 day or more than 5 pounds in 1 week as your diuretic medications may need to be adjusted 3)Avoid ibuprofen/Advil/Aleve/Motrin/Goody Powders/Naproxen/BC powders/Meloxicam/Diclofenac/Indomethacin and other Nonsteroidal anti-inflammatory medications as these will make you more likely to bleed and can cause stomach ulcers, can also cause Kidney problems.  4)Follow-up with Endocrinologist Dr. Loni Beckwith, MD-for further investigations of possible secondary causes of persistent high blood pressure as well as for management of your thyroid dysfunction  Endocrinology, Diabetes & Metabolism-- [facility], [address] [place], [place] [address], Phone Number- [phone] 5)Please follow-up with a gynecologist for management of your heavy menstrual flow/menorrhagia and associated low blood count/anemia 6)Please STOP Triamterene/Hydrochlorothiazide, please take Lasix/Furosemide instead

## 2022-05-07 NOTE — Progress Notes (Signed)
Returned to patient's room after taking patient outside to vehicle & noted 24hr urine collection jug on ice was gone, environmental services in room cleaning & reported that she dumped the urine jug in the toilet, MD notified

## 2022-05-07 NOTE — Discharge Summary (Signed)
Tracy Joseph, is a 51 y.o. female  DOB 06/19/1971  MRN [id number].  Admission date:  05/04/2022  Admitting Physician  Bernadette Hoit, DO  Discharge Date:  05/07/2022   Primary MD  [name], MD  Recommendations for primary care physician for things to follow:   1)Very low-salt diet advised----less than 2 g of sodium per day 2)Weigh yourself daily, call if you gain more than 3 pounds in 1 day or more than 5 pounds in 1 week as your diuretic medications may need to be adjusted 3)Avoid ibuprofen/Advil/Aleve/Motrin/Goody Powders/Naproxen/BC powders/Meloxicam/Diclofenac/Indomethacin and other Nonsteroidal anti-inflammatory medications as these will make you more likely to bleed and can cause stomach ulcers, can also cause Kidney problems.  4)Follow-up with Endocrinologist Dr. Loni Beckwith, MD-for further investigations of possible secondary causes of persistent high blood pressure as well as for management of your thyroid dysfunction  Endocrinology, Diabetes & Metabolism-- [facility], [address] [place], [place] [address], Phone Number- [phone] 5)Please follow-up with a gynecologist for management of your heavy menstrual flow/menorrhagia and associated low blood count/anemia 6)Please STOP Triamterene/Hydrochlorothiazide, please take Lasix/Furosemide instead  Admission Diagnosis  Hypertensive crisis [I16.9] CHF (congestive heart failure) ([place]) [I50.9] New onset of congestive heart failure ([place]) [I50.9]   Discharge Diagnosis  Hypertensive crisis [I16.9] CHF (congestive heart failure) ([place]) [I50.9] New onset of congestive heart failure ([place]) [I50.9]    Principal Problem:   CHF (congestive heart failure) ([place]) Active Problems:   Malignant hypertension   Hypertensive emergency   Elevated brain natriuretic peptide (BNP) level   Chronic kidney disease, stage 3b (HCC)   Hypokalemia    Microcytic anemia   Elevated troponin      Past Medical History:  Diagnosis Date   Essential hypertension    Goiter    Initially hyperthyroid treated with RAI, now hypothyroid - follows with Dr. [name]   Hypertension    Phreesia 10/12/2020   Obstructive sleep apnea    Suspected    Prediabetes    Seasonal allergies    Tubular adenoma of colon     Past Surgical History:  Procedure Laterality Date   COLONOSCOPY N/A 05/03/2017   Procedure: COLONOSCOPY;  Surgeon: Rogene Houston, MD;  Location: [facility];  Service: Endoscopy;  Laterality: N/A;  930   TUBAL LIGATION  2005       HPI  from the history and physical done on the day of admission:     HPI: Tracy Joseph is a 51 y.o. female with medical history significant of malignant hypertension, hypothyroidism, obesity who presents to the emergency department due to chest tightness and shortness of breath which has been going on for several days but which worsened tonight.  Patient states that she usually feels chest tightness with difficulty in being able to breathe when she lies flat in bed to sleep and that she has to quickly sit up in order to be able to breathe better.  She states that she has been using more pillows to sleep within the last  couple of days.  Patient also complained of bilateral leg swelling and shortness of breath on exertion.   ED Course:  In the emergency department, she was tachypneic, BP was 220/127, other vital signs were within normal range.  Work-up in the ED showed microcytic anemia, hypokalemia, creatinine 1.41 (creatinine is within baseline range), troponin x1 - 33, BNP 140.0. Chest x-ray was suggestive of congestive heart failure Patient was treated with IV Lasix 40 mg x 1, nitroglycerin ointment was placed and she was started on IV nitroglycerin drip.  Hospitalist was asked to admit patient for further evaluation and management.   Review of Systems: Review of systems as noted in the HPI. All  other systems reviewed and are negative.     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 Lasix  2)Hypertensive Emergency/underlying Malignant Hypertension -Despite multiple antihypertensives BP control remains difficult -Patient was diagnosed with HTN since she was in her early 5s -Outpatient endocrinology follow-up for further work-up to rule out secondary etiology for HTN strongly encouraged -Aldosterone /renin ordered and pending,  4-hour urine metanephrine ordered -Renal artery Dopplers to be done as outpatient -Patient reports compliance with antihypertensives PTA Patient required IV nitro drip for prolonged period of time to control BP -Okay to discharge on hydralazine 100 mg 3 times daily, labetalol 300 mg twice daily, amlodipine 10 mg daily, isosorbide 60 mg daily -Clonidine patch 0.2 mg started 05/05/2022 -Patient needs to be worked up for possible secondary causes of hypertension -She was diagnosed with HTN around age 64 and she has been very very resistant stage II HTN -Advised outpatient referral to endocrinology   3)AKI----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 -Creatinine around 1.5 at this time  renally adjust medications, avoid nephrotoxic agents / dehydration  / hypotension   4)Acute on chronic anemia--- patient with microcytic/hypochromic iron deficiency anemia-in the setting of longstanding menorrhagia -Patient  is perimenopausal -Hopefully her menstrual flow wiill decreased over time -Received iron transfusion on 05/04/2022 -Patient received B12 shot -Hgb trending back up -Discharged on B12 and iron tablet -Outpatient follow-up with gynecology as advised -Continue Norethindrone hormonal therapy   5)Hypothyroidism--- recent TSH was 21, continue levothyroxine -Very difficult to control hypothyroidism  - outpatient referral to endocrinology advised   6)Hypokalemia--- replace and recheck   Disposition--Home   Disposition: The patient is from: Home              Anticipated d/c is to: Home              Code Status :  -  Code Status: Full Code    Family Communication:    (patient is alert, awake and coherent) -discussed with patient's husband by phone   Discharge Condition: Stable  Follow UP   Follow-up Information     Cassandria Anger, MD. Call in 1 week(s).   Specialty: Endocrinology Why: Need to rule out secondary causes of persistent severe hypertension/erratic difficult to control hypothyroidism as well - Contact information: [place] [address]  [phone]                Diet and Activity recommendation:  As advised  Discharge Instructions    Discharge Instructions     Call MD for:  difficulty breathing, headache or visual disturbances   Complete by: As directed    Call MD for:  persistant dizziness or light-headedness   Complete by: As directed    Call MD for:  persistant nausea and vomiting   Complete by: As directed    Call MD for:  temperature >100.4   Complete by: As directed    Diet - low sodium heart healthy   Complete by: As directed    Discharge instructions   Complete by: As directed    1)Very low-salt diet advised----less than 2 g of sodium per day 2)Weigh yourself daily, call if you gain more than 3 pounds in 1 day or more than 5 pounds in 1 week as your diuretic medications may need to be adjusted 3)Avoid  ibuprofen/Advil/Aleve/Motrin/Goody Powders/Naproxen/BC powders/Meloxicam/Diclofenac/Indomethacin and other Nonsteroidal anti-inflammatory medications as these will make you more likely to bleed and can cause stomach ulcers, can also cause Kidney problems.  4)Follow-up with Endocrinologist Dr. Loni Beckwith, MD-for further investigations of possible secondary causes of persistent high blood pressure as well as for management of your thyroid dysfunction  Endocrinology, Diabetes & Metabolism-- [facility], [place], [place] [address], Phone Number- [phone] 5)Please follow-up with a gynecologist for management of your heavy menstrual flow/menorrhagia and associated low blood count/anemia 6)Please STOP Triamterene/Hydrochlorothiazide, please take Lasix/Furosemide instead   Increase activity slowly   Complete by: As directed        Discharge Medications     Allergies as of 05/07/2022       Reactions   Ace Inhibitors Cough   Dust Mite Extract    Pollen Extract         Medication List     STOP taking these medications    isosorbide dinitrate 20 MG tablet Commonly known as: ISORDIL   triamterene-hydrochlorothiazide 75-50 MG tablet Commonly known as: Maxzide       TAKE these medications    amLODipine 10 MG tablet Commonly known as: NORVASC Take 1 tablet (10 mg total) by mouth daily. Start taking on: May 08, 2022   cetirizine 10 MG tablet Commonly known as: ZYRTEC Take 10 mg by mouth daily as needed for allergies.   cloNIDine 0.2 mg/24hr patch Commonly known as: CATAPRES - Dosed in mg/24 hr Place 1 patch (0.2 mg total) onto the skin once a week. Change every Friday Start taking on: May 12, 2022   ferrous sulfate 325 (65 FE) MG EC tablet Take 1 tablet (325 mg total) by mouth daily with breakfast.   furosemide 40 MG tablet Commonly known as: Lasix Take 1 tablet (40 mg total) by mouth daily.   hydrALAZINE 100 MG tablet Commonly known as:  APRESOLINE Take 1 tablet (100 mg total) by mouth 3 (three) times daily. What changed: when to take this   isosorbide mononitrate 60 MG 24 hr tablet Commonly known as: IMDUR Take 1 tablet (60 mg total) by mouth daily. Start taking on: May 08, 2022   labetalol 300 MG tablet Commonly known as: NORMODYNE Take 1 tablet (300 mg total) by mouth 2 (two) times daily. For BP What changed: additional instructions   levothyroxine 175 MCG tablet Commonly known as: SYNTHROID TAKE ONE AND A HALF TABLETS EVERY MONDAY, WEDNESDAY AND FRIDAY, BY MOUTH. TAKE ONE  TABLET ONCE DAILY EVERY TUESDAY, THURSDAY, SATURDAY AND 'SUNDAY What changed: See the new instructions.   norethindrone 5 MG tablet Commonly known as: Aygestin Take 1 tablet (5 mg total) by mouth daily.   sodium chloride 0.65 % Soln nasal spray Commonly known as: OCEAN Place 1 spray into both nostrils as needed for congestion.   vitamin B-12 1000 MCG tablet Commonly known as: CYANOCOBALAMIN Take 1 tablet (1,000 mcg total) by mouth daily.   Vitamin D (Ergocalciferol) 1.25 MG (50000 UNIT) Caps capsule Commonly known as: DRISDOL Take 50,000 Units by mouth once a week.        Major procedures and Radiology Reports - PLEASE review detailed and final reports for all details, in brief -   DG Chest Portable 1 View  Result Date: 05/04/2022 CLINICAL DATA:  51 year old female with history of chest pain. EXAM: PORTABLE CHEST 1 VIEW COMPARISON:  Chest x-ray 08/26/2004. FINDINGS: There is cephalization of the pulmonary vasculature, indistinctness of the interstitial markings, and patchy airspace disease throughout the lungs bilaterally suggestive of moderate pulmonary edema. Trace bilateral pleural effusions. No pneumothorax. Mild cardiomegaly. Upper mediastinal contours are within normal limits. IMPRESSION: 1. The appearance the chest is most suggestive of congestive heart failure, as above. 1. Electronically Signed   By: Daniel  Entrikin M.D.   On:  05/04/2022 05:15   ECHOCARDIOGRAM COMPLETE  Result Date: 05/04/2022    ECHOCARDIOGRAM REPORT   Patient Name:   Tracy Joseph Date of Exam: 05/04/2022 Medical Rec #:  [id number]        Height:       65.0 in Accession #:    [id number]       Weight:       223.0 lb Date of Birth:  07/14/1971         BSA:          2.072 m Patient Age:    51 years         BP:           161/91 mmHg Patient Gender: F                HR:           81'$  bpm. Exam Location:  [name] Procedure: 2D Echo, Cardiac Doppler and Color Doppler Indications:    CHF  History:        Patient has prior history of Echocardiogram examinations, most                 recent 08/29/2004. CHF; Risk Factors:Hypertension, Diabetes and                 Dyslipidemia.  Sonographer:    Wenda Low Referring Phys: [id number] OLADAPO ADEFESO IMPRESSIONS  1. Left ventricular ejection fraction, by estimation, is 45 to 50%. The left ventricle has mildly decreased function. The left ventricle has no regional wall motion abnormalities. The left ventricular internal cavity size was severely dilated. There is severe left ventricular hypertrophy. Left ventricular diastolic parameters are consistent with Grade II diastolic dysfunction (pseudonormalization). Elevated left atrial pressure.  2. Right ventricular systolic function is normal. The right ventricular size is normal.  3. Left atrial size was severely dilated.  4. Right atrial size was mildly dilated.  5. The mitral valve is normal in structure. Mild mitral valve regurgitation.  6. The aortic valve is tricuspid. Aortic valve regurgitation is not visualized. Aortic valve sclerosis is present, with no evidence of aortic valve stenosis.  7. The inferior vena  cava is normal in size with greater than 50% respiratory variability, suggesting right atrial pressure of 3 mmHg. FINDINGS  Left Ventricle: Left ventricular ejection fraction, by estimation, is 45 to 50%. The left ventricle has mildly decreased function. The left  ventricle has no regional wall motion abnormalities. The left ventricular internal cavity size was severely dilated. There is severe left ventricular hypertrophy. Left ventricular diastolic parameters are consistent with Grade II diastolic dysfunction (pseudonormalization). Elevated left atrial pressure. Right Ventricle: The right ventricular size is normal. Right vetricular wall thickness was not assessed. Right ventricular systolic function is normal. Left Atrium: Left atrial size was severely dilated. Right Atrium: Right atrial size was mildly dilated. Pericardium: There is no evidence of pericardial effusion. Mitral Valve: The mitral valve is normal in structure. Mild mitral valve regurgitation. MV peak gradient, 7.6 mmHg. The mean mitral valve gradient is 3.0 mmHg. Tricuspid Valve: The tricuspid valve is normal in structure. Tricuspid valve regurgitation is trivial. Aortic Valve: The aortic valve is tricuspid. Aortic valve regurgitation is not visualized. Aortic valve sclerosis is present, with no evidence of aortic valve stenosis. Aortic valve mean gradient measures 6.0 mmHg. Aortic valve peak gradient measures 10.6 mmHg. Aortic valve area, by VTI measures 2.12 cm. Pulmonic Valve: The pulmonic valve was normal in structure. Pulmonic valve regurgitation is not visualized. Aorta: The aortic root and ascending aorta are structurally normal, with no evidence of dilitation. Venous: The inferior vena cava is normal in size with greater than 50% respiratory variability, suggesting right atrial pressure of 3 mmHg. IAS/Shunts: No atrial level shunt detected by color flow Doppler.  LEFT VENTRICLE PLAX 2D LVIDd:         6.07 cm      Diastology LVIDs:         3.70 cm      LV e' medial:    6.20 cm/s LV PW:         1.83 cm      LV E/e' medial:  13.8 LV IVS:        1.90 cm      LV e' lateral:   5.22 cm/s LVOT diam:     2.00 cm      LV E/e' lateral: 16.4 LV SV:         68 LV SV Index:   33 LVOT Area:     3.14 cm  LV Volumes  (MOD) LV vol d, MOD A2C: 108.0 ml LV vol d, MOD A4C: 126.0 ml LV vol s, MOD A2C: 52.8 ml LV vol s, MOD A4C: 75.7 ml LV SV MOD A2C:     55.2 ml LV SV MOD A4C:     126.0 ml LV SV MOD BP:      57.5 ml RIGHT VENTRICLE RV Basal diam:  3.95 cm RV Mid diam:    3.30 cm RV S prime:     15.70 cm/s TAPSE (M-mode): 2.5 cm LEFT ATRIUM              Index        RIGHT ATRIUM           Index LA diam:        4.70 cm  2.27 cm/m   RA Area:     19.70 cm LA Vol (A2C):   110.0 ml 53.09 ml/m  RA Volume:   63.70 ml  30.75 ml/m LA Vol (A4C):   112.0 ml 54.06 ml/m LA Biplane Vol: 115.0 ml 55.51 ml/m  AORTIC VALVE  PULMONIC VALVE AV Area (Vmax):    2.10 cm      PV Vmax:       1.01 m/s AV Area (Vmean):   2.13 cm      PV Peak grad:  4.1 mmHg AV Area (VTI):     2.12 cm AV Vmax:           163.00 cm/s AV Vmean:          111.000 cm/s AV VTI:            0.319 m AV Peak Grad:      10.6 mmHg AV Mean Grad:      6.0 mmHg LVOT Vmax:         109.00 cm/s LVOT Vmean:        75.100 cm/s LVOT VTI:          0.215 m LVOT/AV VTI ratio: 0.67  AORTA Ao Root diam: 2.90 cm Ao Asc diam:  3.10 cm MITRAL VALVE MV Area (PHT): 4.04 cm    SHUNTS MV Area VTI:   2.11 cm    Systemic VTI:  0.22 m MV Peak grad:  7.6 mmHg    Systemic Diam: 2.00 cm MV Mean grad:  3.0 mmHg MV Vmax:       1.38 m/s MV Vmean:      84.1 cm/s MV Decel Time: 188 msec MV E velocity: 85.70 cm/s MV A velocity: 97.70 cm/s MV E/A ratio:  0.88 Dorris Carnes MD Electronically signed by Dorris Carnes MD Signature Date/Time: 05/04/2022/9:48:04 PM    Final     Micro Results   Recent Results (from the past 240 hour(s))  MRSA Next Gen by PCR, Nasal     Status: None   Collection Time: 05/04/22  8:20 PM   Specimen: Nasal Mucosa; Nasal Swab  Result Value Ref Range Status   MRSA by PCR Next Gen NOT DETECTED NOT DETECTED Final    Comment: (NOTE) The GeneXpert MRSA Assay (FDA approved for NASAL specimens only), is one component of a comprehensive MRSA colonization  surveillance program. It is not intended to diagnose MRSA infection nor to guide or monitor treatment for MRSA infections. Test performance is not FDA approved in patients less than 35 years old. Performed at [facility], [address], [place], [place] [address]    Today   Subjective    Tracy Joseph today has no new complaints No fever  Or chills  -Voiding well, - No Nausea, Vomiting or Diarrhea  -No significant dyspnea on exertion no dizziness no headaches no visual disturbance no palpitations no chest pains           Patient has been seen and examined prior to discharge   Objective   Blood pressure (!) 156/85, pulse 79, temperature 98.2 F (36.8 C), temperature source Oral, resp. rate 18, height '5\' 5"'$  (1.651 m), weight 99.3 kg, SpO2 100 %.   Intake/Output Summary (Last 24 hours) at 05/07/2022 1605 Last data filed at 05/07/2022 6010 Gross per 24 hour  Intake 340 ml  Output 600 ml  Net -260 ml    Exam Gen:- Awake Alert, no acute distress  HEENT:- [place].AT, No sclera icterus Neck-Supple Neck,No JVD,.  Lungs-  CTAB , good air movement bilaterally CV- S1, S2 normal, regular Abd-  +ve B.Sounds, Abd Soft, No tenderness,    Extremity/Skin:- No  edema,   good pulses Psych-affect is appropriate, oriented x3 Neuro-no new focal deficits, no tremors    Data Review   CBC  w Diff:  Lab Results  Component Value Date   WBC 10.9 (H) 05/07/2022   HGB 8.3 (L) 05/07/2022   HGB 9.7 (L) 03/08/2022   HCT 28.4 (L) 05/07/2022   HCT 32.0 (L) 03/08/2022   PLT 377 05/07/2022   PLT 339 03/08/2022   LYMPHOPCT 26 01/04/2011   MONOPCT 10 01/04/2011   EOSPCT 1 01/04/2011   BASOPCT 0 01/04/2011    CMP:  Lab Results  Component Value Date   NA 137 05/07/2022   NA 136 03/08/2022   K 3.2 (L) 05/07/2022   CL 104 05/07/2022   CO2 26 05/07/2022   BUN 20 05/07/2022   BUN 14 03/08/2022   CREATININE 1.57 (H) 05/07/2022   CREATININE 1.14 (H) 09/16/2019   PROT 6.7 05/05/2022   PROT  7.2 03/08/2022   ALBUMIN 3.4 (L) 05/06/2022   ALBUMIN 4.2 03/08/2022   BILITOT 0.6 05/05/2022   BILITOT 0.3 03/08/2022   ALKPHOS 68 05/05/2022   AST 10 (L) 05/05/2022   ALT 7 05/05/2022    Total Discharge time is about 33 minutes  Roxan Hockey M.D on 05/07/2022 at 4:05 PM  Go to [email or web address] -  for contact info  [facility] - Office  [phone]

## 2022-05-08 ENCOUNTER — Telehealth: Payer: Self-pay | Admitting: *Deleted

## 2022-05-08 NOTE — Telephone Encounter (Signed)
Transition Care Management Follow-up Telephone Call Date of discharge and from where: 05-07-22 [name] How have you been since you were released from the hospital? No chest pains doing fine  Any questions or concerns? No  Items Reviewed: Did the pt receive and understand the discharge instructions provided? Yes  Medications obtained and verified? Yes  Other? No  Any new allergies since your discharge? Yes  Dietary orders reviewed? Yes Do you have support at home? Yes   Home Care and Equipment/Supplies: Were home health services ordered? no If so, what is the name of the agency? NA  Has the agency set up a time to come to the patient's home? not applicable Were any new equipment or medical supplies ordered?  No What is the name of the medical supply agency? [facility] Were you able to get the supplies/equipment? not applicable Do you have any questions related to the use of the equipment or supplies? No  Functional Questionnaire: (I = Independent and D = Dependent) ADLs: i  Bathing/Dressing- i  Meal Prep- i  Eating- i  Maintaining continence- i  Transferring/Ambulation- i  Managing Meds- i  Follow up appointments reviewed:  PCP Hospital f/u appt confirmed? Yes  Scheduled to see Tracy Joseph on 05-09-22 @ 9:20 . [facility] f/u appt confirmed? No   Are transportation arrangements needed? No  If their condition worsens, is the pt aware to call PCP or go to the Emergency Dept.? Yes Was the patient provided with contact information for the PCP's office or ED? Yes Was to pt encouraged to call back with questions or concerns? Yes

## 2022-05-12 LAB — ALDOSTERONE + RENIN ACTIVITY W/ RATIO
ALDO / PRA Ratio: 7.1 (ref 0.0–30.0)
Aldosterone: 18.6 ng/dL (ref 0.0–30.0)
PRA LC/MS/MS: 2.635 ng/mL/hr (ref 0.167–5.380)

## 2022-05-17 ENCOUNTER — Encounter: Payer: Self-pay | Admitting: Family Medicine

## 2022-05-17 ENCOUNTER — Ambulatory Visit (INDEPENDENT_AMBULATORY_CARE_PROVIDER_SITE_OTHER): Payer: Managed Care, Other (non HMO) | Admitting: Family Medicine

## 2022-05-17 VITALS — BP 160/100 | HR 79 | Resp 16 | Ht 65.0 in | Wt 221.0 lb

## 2022-05-17 DIAGNOSIS — I10 Essential (primary) hypertension: Secondary | ICD-10-CM | POA: Diagnosis not present

## 2022-05-17 DIAGNOSIS — Z7689 Persons encountering health services in other specified circumstances: Secondary | ICD-10-CM | POA: Diagnosis not present

## 2022-05-17 DIAGNOSIS — D500 Iron deficiency anemia secondary to blood loss (chronic): Secondary | ICD-10-CM

## 2022-05-17 DIAGNOSIS — I5040 Unspecified combined systolic (congestive) and diastolic (congestive) heart failure: Secondary | ICD-10-CM | POA: Diagnosis not present

## 2022-05-17 DIAGNOSIS — E890 Postprocedural hypothyroidism: Secondary | ICD-10-CM

## 2022-05-17 DIAGNOSIS — E039 Hypothyroidism, unspecified: Secondary | ICD-10-CM

## 2022-05-17 NOTE — Patient Instructions (Addendum)
F/U in  3 months, call if you need me sooner  Continue medications as you are currently taking them  You are referred urgently to Advanced hypertension clinic, and also to Dr Dorris Fetch, PLEASE keep both appointments  Continue daily weights and low sodium diet  TSH, chem 7 and EGFR today  I am referring you to hematology for treatment of iron deficiency also  Thanks for choosing [facility], we consider it a privelige to serve you.

## 2022-05-18 LAB — BMP8+EGFR
BUN/Creatinine Ratio: 12 (ref 9–23)
BUN: 17 mg/dL (ref 6–24)
CO2: 22 mmol/L (ref 20–29)
Calcium: 9.5 mg/dL (ref 8.7–10.2)
Chloride: 101 mmol/L (ref 96–106)
Creatinine, Ser: 1.44 mg/dL — ABNORMAL HIGH (ref 0.57–1.00)
Glucose: 99 mg/dL (ref 70–99)
Potassium: 4 mmol/L (ref 3.5–5.2)
Sodium: 141 mmol/L (ref 134–144)
eGFR: 44 mL/min/{1.73_m2} — ABNORMAL LOW (ref >59)

## 2022-05-18 LAB — TSH: TSH: 2.64 u[IU]/mL (ref 0.450–4.500)

## 2022-05-22 ENCOUNTER — Encounter: Payer: Self-pay | Admitting: Family Medicine

## 2022-05-22 DIAGNOSIS — Z7689 Persons encountering health services in other specified circumstances: Secondary | ICD-10-CM | POA: Insufficient documentation

## 2022-05-22 NOTE — Assessment & Plan Note (Signed)
Controlled, no change in medication  

## 2022-05-22 NOTE — Assessment & Plan Note (Signed)
Remains uncontrolled, refer to advanced HTN clinic, and also to Endo for eval for underlying pathology

## 2022-05-22 NOTE — Assessment & Plan Note (Signed)
Refer for IV iron, anemic with low iron stores, menstrual loss is reportedly less and new dx of heart failure, prsenting decompensated and requiring hospitalization

## 2022-05-22 NOTE — Assessment & Plan Note (Signed)
Hospitalized with acute CHF , improved currently and compliant with plan. Has cardiology referral for malignant HTN

## 2022-05-22 NOTE — Progress Notes (Signed)
   Tracy Joseph     MRN: [id number]      DOB: 03/14/1971   HPI Tracy Joseph is here for follow up of hospitalization from 5/18 to 05/07/2022, due to new dx of heart failure heart failure, endocrinology f/u recommended to r/o underlying causes of malignant hTN also Reports feels much better , denies dyspnea, she is limiting salt, weighing daily and compliant with medication Denies PND or orthopnea  ROS Denies recent fever or chills. Denies sinus pressure, nasal congestion, ear pain or sore throat. Denies chest congestion, productive cough or wheezing. Denies chest pains, palpitations and leg swelling Denies abdominal pain, nausea, vomiting,diarrhea or constipation.   Denies dysuria, frequency, hesitancy or incontinence. Denies joint pain, swelling and limitation in mobility. Denies headaches, seizures, numbness, or tingling. Denies depression, anxiety or insomnia. Denies skin break down or rash.   PE  BP (!) 160/100   Pulse 79   Resp 16   Ht '5\' 5"'$  (1.651 m)   Wt 221 lb (100.2 kg)   SpO2 95%   BMI 36.78 kg/m   Patient alert and oriented and in no cardiopulmonary distress.  HEENT: No facial asymmetry, EOMI,     Neck supple .  Chest: Clear to auscultation bilaterally.  CVS: S1, S2 no murmurs, no S3.Regular rate.  ABD: Soft non tender.   Ext: No edema  MS: Adequate ROM spine, shoulders, hips and knees.  Skin: Intact, no ulcerations or rash noted.  Psych: Good eye contact, normal affect. Memory intact not anxious or depressed appearing.  CNS: CN 2-12 intact, power,  normal throughout.no focal deficits noted.   Assessment & Plan  Encounter for support and coordination of transition of care Patient in for follow up of recent hospitalization. Discharge summary, and laboratory and radiology data are reviewed, and any questions or concerns  are discussed. Specific issues requiring follow up are specifically addressed.She is referrd to HTN clinic, endocrinology and  hematology   CHF (congestive heart failure) ([place]) Hospitalized with acute CHF , improved currently and compliant with plan. Has cardiology referral for malignant HTN  Malignant hypertension Remains uncontrolled, refer to advanced HTN clinic, and also to Endo for eval for underlying pathology  Iron deficiency anemia Refer for IV iron, anemic with low iron stores, menstrual loss is reportedly less and new dx of heart failure, prsenting decompensated and requiring hospitalization  Hypothyroidism following radioiodine therapy Controlled, no change in medication

## 2022-05-22 NOTE — Assessment & Plan Note (Addendum)
Patient in for follow up of recent hospitalization. Discharge summary, and laboratory and radiology data are reviewed, and any questions or concerns  are discussed. Specific issues requiring follow up are specifically addressed.She is referrd to HTN clinic, endocrinology and hematology

## 2022-05-26 ENCOUNTER — Inpatient Hospital Stay (HOSPITAL_COMMUNITY): Payer: Managed Care, Other (non HMO)

## 2022-05-26 ENCOUNTER — Inpatient Hospital Stay (HOSPITAL_COMMUNITY): Payer: Managed Care, Other (non HMO) | Attending: Hematology | Admitting: Hematology

## 2022-05-26 ENCOUNTER — Encounter (HOSPITAL_COMMUNITY)
Admission: RE | Admit: 2022-05-26 | Discharge: 2022-05-26 | Disposition: A | Discharge status: Home / Self Care | Payer: Managed Care, Other (non HMO) | Source: Ambulatory Visit | Attending: Hematology | Admitting: Hematology

## 2022-05-26 VITALS — BP 169/93 | HR 78 | Temp 98.0°F | Resp 18 | Ht 65.0 in | Wt 218.4 lb

## 2022-05-26 DIAGNOSIS — N189 Chronic kidney disease, unspecified: Secondary | ICD-10-CM | POA: Diagnosis not present

## 2022-05-26 DIAGNOSIS — Z793 Long term (current) use of hormonal contraceptives: Secondary | ICD-10-CM

## 2022-05-26 DIAGNOSIS — D509 Iron deficiency anemia, unspecified: Secondary | ICD-10-CM

## 2022-05-26 DIAGNOSIS — F5089 Other specified eating disorder: Secondary | ICD-10-CM | POA: Diagnosis not present

## 2022-05-26 DIAGNOSIS — E538 Deficiency of other specified B group vitamins: Secondary | ICD-10-CM | POA: Diagnosis present

## 2022-05-26 DIAGNOSIS — Z79899 Other long term (current) drug therapy: Secondary | ICD-10-CM | POA: Diagnosis not present

## 2022-05-26 LAB — CBC WITH DIFFERENTIAL/PLATELET
Abs Immature Granulocytes: 0.02 10*3/uL (ref 0.00–0.07)
Basophils Absolute: 0 10*3/uL (ref 0.0–0.1)
Basophils Relative: 0 %
Eosinophils Absolute: 0.1 10*3/uL (ref 0.0–0.5)
Eosinophils Relative: 1 %
HCT: 34.3 % — ABNORMAL LOW (ref 36.0–46.0)
Hemoglobin: 10.3 g/dL — ABNORMAL LOW (ref 12.0–15.0)
Immature Granulocytes: 0 %
Lymphocytes Relative: 17 %
Lymphs Abs: 1.3 10*3/uL (ref 0.7–4.0)
MCH: 24.9 pg — ABNORMAL LOW (ref 26.0–34.0)
MCHC: 30 g/dL (ref 30.0–36.0)
MCV: 82.9 fL (ref 80.0–100.0)
Monocytes Absolute: 0.6 10*3/uL (ref 0.1–1.0)
Monocytes Relative: 8 %
Neutro Abs: 5.6 10*3/uL (ref 1.7–7.7)
Neutrophils Relative %: 74 %
Platelets: 246 10*3/uL (ref 150–400)
RBC: 4.14 MIL/uL (ref 3.87–5.11)
RDW: 25.2 % — ABNORMAL HIGH (ref 11.5–15.5)
WBC: 7.6 10*3/uL (ref 4.0–10.5)
nRBC: 0 % (ref 0.0–0.2)

## 2022-05-26 LAB — RETICULOCYTES
Immature Retic Fract: 22.9 % — ABNORMAL HIGH (ref 2.3–15.9)
RBC.: 4.07 MIL/uL (ref 3.87–5.11)
Retic Count, Absolute: 65.9 10*3/uL (ref 19.0–186.0)
Retic Ct Pct: 1.6 % (ref 0.4–3.1)

## 2022-05-26 LAB — IRON AND TIBC
Iron: 40 ug/dL (ref 28–170)
Saturation Ratios: 10 % — ABNORMAL LOW (ref 10.4–31.8)
TIBC: 387 ug/dL (ref 250–450)
UIBC: 347 ug/dL

## 2022-05-26 LAB — LACTATE DEHYDROGENASE: LDH: 120 U/L (ref 98–192)

## 2022-05-26 LAB — FERRITIN: Ferritin: 17 ng/mL (ref 11–307)

## 2022-05-26 LAB — VITAMIN B12: Vitamin B-12: 507 pg/mL (ref 180–914)

## 2022-05-26 LAB — FOLATE: Folate: 10.8 ng/mL (ref >5.9)

## 2022-05-26 NOTE — Patient Instructions (Addendum)
[place] [facility] Discharge Instructions  You were seen and examined today by Dr. Delton Coombes. Dr. Delton Coombes is a hematologist, meaning that he specializes in blood abnormalities. Dr. Delton Coombes discussed your past medical history, family history of cancers/blood conditions and the events that led to you being here today.  You were referred to Dr. Delton Coombes due to anemia, low blood counts. Dr. Delton Coombes has recommended additional labs today in an attempt to identify the cause of your anemia.  Based on your most recent labs, Dr. Delton Coombes has recommended iron infusions. Those will be given here in the clinic over two to three doses, depending on what your insurance requires.  Follow-up as scheduled.   Thank you for choosing [facility] to provide your oncology and hematology care.  To afford each patient quality time with our provider, please arrive at least 15 minutes before your scheduled appointment time.   If you have a lab appointment with the [facility] please come in thru the Main Entrance and check in at the main information desk.  You need to re-schedule your appointment should you arrive 10 or more minutes late.  We strive to give you quality time with our providers, and arriving late affects you and other patients whose appointments are after yours.  Also, if you no show three or more times for appointments you may be dismissed from the clinic at the providers discretion.     Again, thank you for choosing [facility].  Our hope is that these requests will decrease the amount of time that you wait before being seen by our physicians.       _____________________________________________________________  Should you have questions after your visit to [facility], please contact our office at [phone] and follow the prompts.  Our office hours are 8:00 a.m. and 4:30 p.m. Monday - Friday.   Please note that voicemails left after 4:00 p.m. may not be returned until the following business day.  We are closed weekends and major holidays.  You do have access to a nurse 24-7, just call the main number to the clinic [phone] and do not press any options, hold on the line and a nurse will answer the phone.    For prescription refill requests, have your pharmacy contact our office and allow 72 hours.    Due to Covid, you will need to wear a mask upon entering the hospital. If you do not have a mask, a mask will be given to you at the Main Entrance upon arrival. For doctor visits, patients may have 1 support person age 22 or older with them. For treatment visits, patients can not have anyone with them due to social distancing guidelines and our immunocompromised population.

## 2022-05-26 NOTE — Addendum Note (Signed)
Addended by: Brien Mates on: 05/26/2022 10:51 AM   Modules accepted: Orders

## 2022-05-26 NOTE — Progress Notes (Signed)
[facility] [address], [place] [address]   CLINIC:  Medical Oncology/Hematology  Patient Care Team: [name], MD as PCP - General [name], Terisa Starr, MD as PCP - Cardiology (Cardiology) Derek Jack, MD as Medical Oncologist (Hematology)  CHIEF COMPLAINTS/PURPOSE OF CONSULTATION:  Evaluation for microcytic anemia  HISTORY OF PRESENTING ILLNESS:  Tracy Joseph 51 y.o. female is here because of evaluation of microcytic anemia, at the request of Dr. Moshe Cipro.  Today she reports feeling good. She is currently taking iron tablets, and she is tolerating them well. Se denies previously taking iron supplements. She reports ice pica. She has menses every 14-18 days, and she reports heavy bleeding over the past few years. She denies hematochezia and hematuria. She underwent a colonoscopy in 2018. Her energy levels are good. She currently takes vitamin B-12 and a multivitamin. She denies fevers, night sweats, and unintentional weight loss. She reports she intentionally lost about 12 lbs.    She currently works in Programmer, applications. She denies smoking history. She denies family history of cancer and anemia.   MEDICAL HISTORY:  Past Medical History:  Diagnosis Date   Essential hypertension    Goiter    Initially hyperthyroid treated with RAI, now hypothyroid - follows with Dr. [name]   Hypertension    Phreesia 10/12/2020   Obstructive sleep apnea    Suspected    Prediabetes    Seasonal allergies    Tubular adenoma of colon     SURGICAL HISTORY: Past Surgical History:  Procedure Laterality Date   COLONOSCOPY N/A 05/03/2017   Procedure: COLONOSCOPY;  Surgeon: Rogene Houston, MD;  Location: [facility];  Service: Endoscopy;  Laterality: N/A;  930   TUBAL LIGATION  2005    SOCIAL HISTORY: Social History   Socioeconomic History   Marital status: Married    Spouse name: Not on file   Number of children: 1   Years of education: Not  on file   Highest education level: Not on file  Occupational History   Occupation: full time student for [employer]  Tobacco Use   Smoking status: Never   Smokeless tobacco: Never  Vaping Use   Vaping Use: Never used  Substance and Sexual Activity   Alcohol use: No   Drug use: No   Sexual activity: Yes    Birth control/protection: None  Other Topics Concern   Not on file  Social History Narrative   Not on file   Social Determinants of Health   Financial Resource Strain: Low Risk  (03/29/2022)   Overall Financial Resource Strain (CARDIA)    Difficulty of Paying Living Expenses: Not hard at all  Food Insecurity: No Food Insecurity (03/29/2022)   Hunger Vital Sign    Worried About Running Out of Food in the Last Year: Never true    [place] in the Last Year: Never true  Transportation Needs: No Transportation Needs (03/29/2022)   PRAPARE - Hydrologist (Medical): No    Lack of Transportation (Non-Medical): No  Physical Activity: Sufficiently Active (03/29/2022)   Exercise Vital Sign    Days of Exercise per Week: 3 days    Minutes of Exercise per Session: 60 min  Stress: No Stress Concern Present (03/29/2022)   [place]    Feeling of Stress : Not at all  Social Connections: [place] (03/29/2022)   Social Connection and Isolation Panel [  NHANES]    Frequency of Communication with Friends and Family: Twice a week    Frequency of Social Gatherings with Friends and Family: Twice a week    Attends Religious Services: More than 4 times per year    Active Member of [employer] or Organizations: Yes    Attends Banker Meetings: 1 to 4 times per year    Marital Status: Married  Catering manager Violence: Not At Risk (03/29/2022)   Humiliation, Afraid, Rape, and Kick questionnaire    Fear of Current or Ex-Partner: No    Emotionally Abused: No    Physically Abused: No     Sexually Abused: No    FAMILY HISTORY: Family History  Problem Relation Age of Onset   Hypertension Mother    Hyperlipidemia Mother    Thyroid disease Paternal Grandmother    Coronary artery disease Maternal Aunt    Coronary artery disease Maternal Aunt     ALLERGIES:  is allergic to ace inhibitors, dust mite extract, and pollen extract.  MEDICATIONS:  Current Outpatient Medications  Medication Sig Dispense Refill   amLODipine (NORVASC) 10 MG tablet Take 1 tablet (10 mg total) by mouth daily. 90 tablet 3   cloNIDine (CATAPRES - DOSED IN MG/24 HR) 0.2 mg/24hr patch Place 1 patch (0.2 mg total) onto the skin once a week. Change every Friday 4 patch 12   ferrous sulfate 325 (65 FE) MG EC tablet Take 1 tablet (325 mg total) by mouth daily with breakfast. 90 tablet 3   furosemide (LASIX) 40 MG tablet Take 1 tablet (40 mg total) by mouth daily. 90 tablet 3   hydrALAZINE (APRESOLINE) 100 MG tablet Take 1 tablet (100 mg total) by mouth 3 (three) times daily. 270 tablet 2   isosorbide mononitrate (IMDUR) 60 MG 24 hr tablet Take 1 tablet (60 mg total) by mouth daily. 90 tablet 3   labetalol (NORMODYNE) 300 MG tablet Take 1 tablet (300 mg total) by mouth 2 (two) times daily. For BP 180 tablet 3   levothyroxine (SYNTHROID) 175 MCG tablet TAKE ONE AND A HALF TABLETS EVERY MONDAY, WEDNESDAY AND FRIDAY, BY MOUTH. TAKE ONE TABLET ONCE DAILY EVERY TUESDAY, THURSDAY, SATURDAY AND SUNDAY (Patient taking differently: Take 175-262.5 mcg by mouth See admin instructions. Take one and a half tablets(262.36mcg) every Monday, Wednesday and Friday, by mouth. Take one tablet(158mcg) once daily every Tuesday, Thursday, Saturday and Sunday) 108 tablet 1   norethindrone (AYGESTIN) 5 MG tablet Take 1 tablet (5 mg total) by mouth daily. 90 tablet 4   sodium chloride (OCEAN) 0.65 % SOLN nasal spray Place 1 spray into both nostrils as needed for congestion.     vitamin B-12 (CYANOCOBALAMIN) 1000 MCG tablet Take 1 tablet  (1,000 mcg total) by mouth daily. 30 tablet 3   Vitamin D, Ergocalciferol, (DRISDOL) 1.25 MG (50000 UNIT) CAPS capsule Take 50,000 Units by mouth once a week.     cetirizine (ZYRTEC) 10 MG tablet Take 10 mg by mouth daily as needed for allergies.     No current facility-administered medications for this visit.    REVIEW OF SYSTEMS:   Review of Systems  Constitutional:  Negative for appetite change, fatigue, fever and unexpected weight change.  Gastrointestinal:  Negative for blood in stool.  Endocrine: Negative for hot flashes.  Genitourinary:  Positive for menstrual problem. Negative for hematuria.   All other systems reviewed and are negative.    PHYSICAL EXAMINATION: ECOG PERFORMANCE STATUS: 0 - Asymptomatic  Vitals:  05/26/22 0904  BP: (!) 169/93  Pulse: 78  Resp: 18  Temp: 98 F (36.7 C)  SpO2: 98%   Filed Weights   05/26/22 0904  Weight: 218 lb 6.4 oz (99.1 kg)   Physical Exam Vitals reviewed.  Constitutional:      Appearance: Normal appearance. She is obese.  Cardiovascular:     Rate and Rhythm: Normal rate and regular rhythm.     Pulses: Normal pulses.     Heart sounds: Normal heart sounds.  Pulmonary:     Effort: Pulmonary effort is normal.     Breath sounds: Normal breath sounds.  Abdominal:     Palpations: Abdomen is soft. There is no hepatomegaly, splenomegaly or mass.     Tenderness: There is no abdominal tenderness.  Musculoskeletal:     Right lower leg: No edema.     Left lower leg: No edema.  Lymphadenopathy:     Upper Body:     Right upper body: No supraclavicular or axillary adenopathy.     Left upper body: No supraclavicular or axillary adenopathy.     Lower Body: No right inguinal adenopathy. No left inguinal adenopathy.  Neurological:     General: No focal deficit present.     Mental Status: She is alert and oriented to person, place, and time.  Psychiatric:        Mood and Affect: Mood normal.        Behavior: Behavior normal.       LABORATORY DATA:  I have reviewed the data as listed Recent Results (from the past 2160 hour(s))  POCT glycosylated hemoglobin (Hb A1C)     Status: None   Collection Time: 03/08/22  9:19 AM  Result Value Ref Range   Hemoglobin A1C     HbA1c POC (<> result, manual entry)     HbA1c, POC (prediabetic range) 6.1 5.7 - 6.4 %   HbA1c, POC (controlled diabetic range)    TSH     Status: Abnormal   Collection Time: 03/08/22  9:30 AM  Result Value Ref Range   TSH 21.400 (H) 0.450 - 4.500 uIU/mL  CBC     Status: Abnormal   Collection Time: 03/08/22  9:30 AM  Result Value Ref Range   WBC 6.5 3.4 - 10.8 x10E3/uL   RBC 4.15 3.77 - 5.28 x10E6/uL   Hemoglobin 9.7 (L) 11.1 - 15.9 g/dL   Hematocrit 32.0 (L) 34.0 - 46.6 %   MCV 77 (L) 79 - 97 fL   MCH 23.4 (L) 26.6 - 33.0 pg   MCHC 30.3 (L) 31.5 - 35.7 g/dL   RDW 15.8 (H) 11.7 - 15.4 %   Platelets 339 150 - 450 x10E3/uL  Lipid panel     Status: Abnormal   Collection Time: 03/08/22  9:30 AM  Result Value Ref Range   Cholesterol, Total 214 (H) 100 - 199 mg/dL   Triglycerides 135 0 - 149 mg/dL   HDL 50 >39 mg/dL   VLDL Cholesterol Cal 24 5 - 40 mg/dL   LDL Chol Calc (NIH) 140 (H) 0 - 99 mg/dL   Chol/HDL Ratio 4.3 0.0 - 4.4 ratio    Comment:                                   T. Chol/HDL Ratio  Men  Women                               1/2 Avg.Risk  3.4    3.3                                   Avg.Risk  5.0    4.4                                2X Avg.Risk  9.6    7.1                                3X Avg.Risk 23.4   11.0   CMP14+EGFR     Status: Abnormal   Collection Time: 03/08/22  9:30 AM  Result Value Ref Range   Glucose 94 70 - 99 mg/dL   BUN 14 6 - 24 mg/dL   Creatinine, Ser 1.45 (H) 0.57 - 1.00 mg/dL   eGFR 43 (L) >59 mL/min/1.73   BUN/Creatinine Ratio 10 9 - 23   Sodium 136 134 - 144 mmol/L   Potassium 3.6 3.5 - 5.2 mmol/L   Chloride 98 96 - 106 mmol/L   CO2 26 20 - 29 mmol/L    Calcium 9.1 8.7 - 10.2 mg/dL   Total Protein 7.2 6.0 - 8.5 g/dL   Albumin 4.2 3.8 - 4.9 g/dL   Globulin, Total 3.0 1.5 - 4.5 g/dL   Albumin/Globulin Ratio 1.4 1.2 - 2.2   Bilirubin Total 0.3 0.0 - 1.2 mg/dL   Alkaline Phosphatase 98 44 - 121 IU/L   AST 15 0 - 40 IU/L   ALT 5 0 - 32 IU/L  VITAMIN D 25 Hydroxy (Vit-D Deficiency, Fractures)     Status: Abnormal   Collection Time: 03/08/22  9:30 AM  Result Value Ref Range   Vit D, 25-Hydroxy 17.3 (L) 30.0 - 100.0 ng/mL    Comment: Vitamin D deficiency has been defined by the Institute of Medicine and an Endocrine Society practice guideline as a level of serum 25-OH vitamin D less than 20 ng/mL (1,2). The Endocrine Society went on to further define vitamin D insufficiency as a level between 21 and 29 ng/mL (2). 1. IOM (Institute of Medicine). 2010. Dietary reference    intakes for calcium and D. [place]: The    [employer]. 2. Holick MF, Binkley [place], Bischoff-Ferrari HA, et al.    Evaluation, treatment, and prevention of vitamin D    deficiency: an Endocrine Society clinical practice    guideline. JCEM. 2011 Jul; 96(7):1911-30.   Hepatitis C antibody     Status: Abnormal   Collection Time: 03/08/22  9:30 AM  Result Value Ref Range   Hep C Virus Ab Reactive (A) Non Reactive    Comment: HCV antibody alone does not differentiate between previously resolved infection and active infection. Equivocal and Reactive HCV antibody results should be followed up with an HCV RNA test to support the diagnosis of active HCV infection.   Ferritin     Status: Abnormal   Collection Time: 03/08/22  9:30 AM  Result Value Ref Range   Ferritin 11 (L) 15 - 150 ng/mL  Iron     Status: None   Collection Time:  03/08/22  9:30 AM  Result Value Ref Range   Iron 27 27 - 159 ug/dL  HCV RNA quant rflx ultra or genotyp     Status: None   Collection Time: 03/08/22  9:30 AM  Result Value Ref Range   HCV Quant Baseline CANCELED IU/mL     Comment: Test not performed. Insufficient specimen to perform or complete analysis.  Result canceled by the ancillary.    HCV log10 CANCELED log10 IU/mL    Comment: Test not performed. Insufficient specimen to perform or complete analysis.  Result canceled by the ancillary.    Test Information Comment     Comment: The quantitative range of this assay is 15 IU/mL to 100 million IU/mL.   HCV Genotype CANCELED     Comment: Test not performed. Insufficient specimen to perform or complete analysis.  Result canceled by the ancillary.   Specimen status report     Status: None (Preliminary result)   Collection Time: 03/08/22  9:30 AM  Result Value Ref Range   specimen status report Comment     Comment: Written Authorization Written Authorization   Basic metabolic panel     Status: Abnormal   Collection Time: 05/04/22  4:37 AM  Result Value Ref Range   Sodium 137 135 - 145 mmol/L   Potassium 3.1 (L) 3.5 - 5.1 mmol/L   Chloride 106 98 - 111 mmol/L   CO2 25 22 - 32 mmol/L   Glucose, Bld 118 (H) 70 - 99 mg/dL    Comment: Glucose reference range applies only to samples taken after fasting for at least 8 hours.   BUN 16 6 - 20 mg/dL   Creatinine, Ser 1.41 (H) 0.44 - 1.00 mg/dL   Calcium 8.6 (L) 8.9 - 10.3 mg/dL   GFR, Estimated 45 (L) >60 mL/min    Comment: (NOTE) Calculated using the CKD-EPI Creatinine Equation (2021)    Anion gap 6 5 - 15    Comment: Performed at [facility], [address], [place], [place] [address]  CBC     Status: Abnormal   Collection Time: 05/04/22  4:37 AM  Result Value Ref Range   WBC 8.0 4.0 - 10.5 K/uL   RBC 3.63 (L) 3.87 - 5.11 MIL/uL   Hemoglobin 8.3 (L) 12.0 - 15.0 g/dL    Comment: Reticulocyte Hemoglobin testing may be clinically indicated, consider ordering this additional test [id number]    HCT 28.2 (L) 36.0 - 46.0 %   MCV 77.7 (L) 80.0 - 100.0 fL   MCH 22.9 (L) 26.0 - 34.0 pg   MCHC 29.4 (L) 30.0 - 36.0 g/dL   RDW 18.6 (H) 11.5 - 15.5 %    Platelets 341 150 - 400 K/uL   nRBC 0.0 0.0 - 0.2 %    Comment: Performed at [facility], [address] [place], [place] [address]  Troponin I (High Sensitivity)     Status: Abnormal   Collection Time: 05/04/22  4:37 AM  Result Value Ref Range   Troponin I (High Sensitivity) 33 (H) <18 ng/L    Comment: (NOTE) Elevated high sensitivity troponin I (hsTnI) values and significant  changes across serial measurements may suggest ACS but many other  chronic and acute conditions are known to elevate hsTnI results.  Refer to the "Links" section for chest pain algorithms and additional  guidance. Performed at [facility], [address], [place], [place] [address]   Brain natriuretic peptide     Status: Abnormal   Collection Time:  05/04/22  4:37 AM  Result Value Ref Range   B Natriuretic Peptide 140.0 (H) 0.0 - 100.0 pg/mL    Comment: Performed at [facility], [address] [place], [place] [address]  HIV Antibody (routine testing w rflx)     Status: None   Collection Time: 05/04/22  4:37 AM  Result Value Ref Range   HIV Screen 4th Generation wRfx Non Reactive Non Reactive    Comment: Performed at [facility], [place] [address], [place], [place] [address]  Magnesium     Status: None   Collection Time: 05/04/22  4:37 AM  Result Value Ref Range   Magnesium 2.1 1.7 - 2.4 mg/dL    Comment: Performed at [facility], [address], [place], [place] [address]  Phosphorus     Status: None   Collection Time: 05/04/22  4:37 AM  Result Value Ref Range   Phosphorus 3.2 2.5 - 4.6 mg/dL    Comment: Performed at [facility], [address], [place], [place] [address]  POC urine preg, ED     Status: None   Collection Time: 05/04/22  5:05 AM  Result Value Ref Range   Preg Test, Ur NEGATIVE NEGATIVE    Comment:        THE SENSITIVITY OF THIS METHODOLOGY IS >24 mIU/mL   Troponin I (High Sensitivity)     Status: Abnormal   Collection Time: 05/04/22  6:09 AM  Result Value Ref Range    Troponin I (High Sensitivity) 35 (H) <18 ng/L    Comment: (NOTE) Elevated high sensitivity troponin I (hsTnI) values and significant  changes across serial measurements may suggest ACS but many other  chronic and acute conditions are known to elevate hsTnI results.  Refer to the "Links" section for chest pain algorithms and additional  guidance. Performed at [facility], [address] [place], [place] [address]   Ferritin     Status: Abnormal   Collection Time: 05/04/22  6:09 AM  Result Value Ref Range   Ferritin 5 (L) 11 - 307 ng/mL    Comment: Performed at [facility], [address], [place], [place] [address]  Iron and TIBC     Status: Abnormal   Collection Time: 05/04/22  6:09 AM  Result Value Ref Range   Iron 14 (L) 28 - 170 ug/dL   TIBC 421 250 - 450 ug/dL   Saturation Ratios 3 (L) 10.4 - 31.8 %   UIBC 407 ug/dL    Comment: Performed at [facility], [address], [place], [place] [address]  Aldosterone + renin activity w/ ratio     Status: None   Collection Time: 05/04/22  8:38 AM  Result Value Ref Range   PRA LC/MS/MS 2.635 0.167 - 5.380 ng/mL/hr    Comment: (NOTE) This test was developed and its performance characteristics determined by [facility]. It has not been cleared or approved by the Food and Drug Administration.    ALDO / PRA Ratio 7.1 0.0 - 30.0    Comment: (NOTE)                         Units:      ng/dL per ng/mL/hr Performed At: [facility] [place], [place] [id number] Rush Farmer MD PO:[id number]    Aldosterone 18.6 0.0 - 30.0 ng/dL    Comment: (NOTE) This test was developed and its performance characteristics determined by [facility]. It has not been cleared or approved  by the Food and Drug Administration.   ECHOCARDIOGRAM COMPLETE     Status: None   Collection Time: 05/04/22  1:45 PM  Result Value Ref Range   Weight 3,568 oz   Height 65 in   BP 186/112 mmHg   Single Plane A2C EF 51.1 %   Single Plane A4C EF 39.9 %    Calc EF 46.1 %   AR max vel 2.10 cm2   AV Area VTI 2.12 cm2   AV Mean grad 6.0 mmHg   AV Peak grad 10.6 mmHg   Ao pk vel 1.63 m/s   AV Area mean vel 2.13 cm2   MV VTI 2.11 cm2   Area-P 1/2 4.04 cm2   S' Lateral 3.70 cm  MRSA Next Gen by PCR, Nasal     Status: None   Collection Time: 05/04/22  8:20 PM   Specimen: Nasal Mucosa; Nasal Swab  Result Value Ref Range   MRSA by PCR Next Gen NOT DETECTED NOT DETECTED    Comment: (NOTE) The GeneXpert MRSA Assay (FDA approved for NASAL specimens only), is one component of a comprehensive MRSA colonization surveillance program. It is not intended to diagnose MRSA infection nor to guide or monitor treatment for MRSA infections. Test performance is not FDA approved in patients less than 38 years old. Performed at [facility], [address], [place], [place] [address]   Comprehensive metabolic panel     Status: Abnormal   Collection Time: 05/05/22  3:24 AM  Result Value Ref Range   Sodium 136 135 - 145 mmol/L   Potassium 3.2 (L) 3.5 - 5.1 mmol/L   Chloride 103 98 - 111 mmol/L   CO2 24 22 - 32 mmol/L   Glucose, Bld 105 (H) 70 - 99 mg/dL    Comment: Glucose reference range applies only to samples taken after fasting for at least 8 hours.   BUN 19 6 - 20 mg/dL   Creatinine, Ser 9.48 (H) 0.44 - 1.00 mg/dL   Calcium 8.7 (L) 8.9 - 10.3 mg/dL   Total Protein 6.7 6.5 - 8.1 g/dL   Albumin 3.4 (L) 3.5 - 5.0 g/dL   AST 10 (L) 15 - 41 U/L   ALT 7 0 - 44 U/L   Alkaline Phosphatase 68 38 - 126 U/L   Total Bilirubin 0.6 0.3 - 1.2 mg/dL   GFR, Estimated 36 (L) >60 mL/min    Comment: (NOTE) Calculated using the CKD-EPI Creatinine Equation (2021)    Anion gap 9 5 - 15    Comment: Performed at [facility] [address] [place], [place] [address]  CBC     Status: Abnormal   Collection Time: 05/05/22  3:24 AM  Result Value Ref Range   WBC 11.4 (H) 4.0 - 10.5 K/uL   RBC 3.22 (L) 3.87 - 5.11 MIL/uL   Hemoglobin 7.3 (L) 12.0 - 15.0 g/dL    Comment:  Reticulocyte Hemoglobin testing may be clinically indicated, consider ordering this additional test [id number]    HCT 24.8 (L) 36.0 - 46.0 %   MCV 77.0 (L) 80.0 - 100.0 fL   MCH 22.7 (L) 26.0 - 34.0 pg   MCHC 29.4 (L) 30.0 - 36.0 g/dL   RDW 18.2 (H) 99.3 - 71.6 %   Platelets 341 150 - 400 K/uL   nRBC 0.0 0.0 - 0.2 %    Comment: Performed at [facility], [address] [place], [place] [address]  APTT     Status: None  Collection Time: 05/05/22  3:24 AM  Result Value Ref Range   aPTT 32 24 - 36 seconds    Comment: Performed at [facility], [address] [place], [place] [address]  Renal function panel     Status: Abnormal   Collection Time: 05/06/22  1:36 AM  Result Value Ref Range   Sodium 136 135 - 145 mmol/L   Potassium 3.3 (L) 3.5 - 5.1 mmol/L   Chloride 102 98 - 111 mmol/L   CO2 26 22 - 32 mmol/L   Glucose, Bld 124 (H) 70 - 99 mg/dL    Comment: Glucose reference range applies only to samples taken after fasting for at least 8 hours.   BUN 18 6 - 20 mg/dL   Creatinine, Ser 1.52 (H) 0.44 - 1.00 mg/dL   Calcium 8.7 (L) 8.9 - 10.3 mg/dL   Phosphorus 3.6 2.5 - 4.6 mg/dL   Albumin 3.4 (L) 3.5 - 5.0 g/dL   GFR, Estimated 41 (L) >60 mL/min    Comment: (NOTE) Calculated using the CKD-EPI Creatinine Equation (2021)    Anion gap 8 5 - 15    Comment: Performed at [facility], [address] [place], [place] [address]  CBC     Status: Abnormal   Collection Time: 05/06/22  1:36 AM  Result Value Ref Range   WBC 11.2 (H) 4.0 - 10.5 K/uL   RBC 3.51 (L) 3.87 - 5.11 MIL/uL   Hemoglobin 8.0 (L) 12.0 - 15.0 g/dL    Comment: Reticulocyte Hemoglobin testing may be clinically indicated, consider ordering this additional test [id number]    HCT 26.6 (L) 36.0 - 46.0 %   MCV 75.8 (L) 80.0 - 100.0 fL   MCH 22.8 (L) 26.0 - 34.0 pg   MCHC 30.1 30.0 - 36.0 g/dL   RDW 18.7 (H) 11.5 - 15.5 %   Platelets 351 150 - 400 K/uL   nRBC 0.0 0.0 - 0.2 %    Comment: Performed at [facility],  [address], [place], [place] [address]  Vitamin B12     Status: None   Collection Time: 05/06/22  1:36 AM  Result Value Ref Range   Vitamin B-12 212 180 - 914 pg/mL    Comment: (NOTE) This assay is not validated for testing neonatal or myeloproliferative syndrome specimens for Vitamin B12 levels. Performed at [facility], [address], [place], [place] [address]   Folate     Status: None   Collection Time: 05/06/22  1:36 AM  Result Value Ref Range   Folate 8.6 >5.9 ng/mL    Comment: Performed at [facility], [address], [place], [place] [address]  Basic metabolic panel     Status: Abnormal   Collection Time: 05/07/22  3:58 AM  Result Value Ref Range   Sodium 137 135 - 145 mmol/L   Potassium 3.2 (L) 3.5 - 5.1 mmol/L   Chloride 104 98 - 111 mmol/L   CO2 26 22 - 32 mmol/L   Glucose, Bld 115 (H) 70 - 99 mg/dL    Comment: Glucose reference range applies only to samples taken after fasting for at least 8 hours.   BUN 20 6 - 20 mg/dL   Creatinine, Ser 1.57 (H) 0.44 - 1.00 mg/dL   Calcium 8.9 8.9 - 10.3 mg/dL   GFR, Estimated 39 (L) >60 mL/min    Comment: (NOTE) Calculated using the CKD-EPI Creatinine Equation (2021)    Anion gap 7 5 - 15    Comment: Performed  at [facility], [address] [place], [place] [address]  CBC     Status: Abnormal   Collection Time: 05/07/22  3:58 AM  Result Value Ref Range   WBC 10.9 (H) 4.0 - 10.5 K/uL   RBC 3.68 (L) 3.87 - 5.11 MIL/uL   Hemoglobin 8.3 (L) 12.0 - 15.0 g/dL    Comment: Reticulocyte Hemoglobin testing may be clinically indicated, consider ordering this additional test [id number]    HCT 28.4 (L) 36.0 - 46.0 %   MCV 77.2 (L) 80.0 - 100.0 fL   MCH 22.6 (L) 26.0 - 34.0 pg   MCHC 29.2 (L) 30.0 - 36.0 g/dL   RDW 19.0 (H) 11.5 - 15.5 %   Platelets 377 150 - 400 K/uL   nRBC 0.2 0.0 - 0.2 %    Comment: Performed at [facility], [address], [place], [place] [address]  TSH     Status: None   Collection Time: 05/17/22 10:23 AM   Result Value Ref Range   TSH 2.640 0.450 - 4.500 uIU/mL  BMP8+EGFR     Status: Abnormal   Collection Time: 05/17/22 10:23 AM  Result Value Ref Range   Glucose 99 70 - 99 mg/dL   BUN 17 6 - 24 mg/dL   Creatinine, Ser 1.44 (H) 0.57 - 1.00 mg/dL   eGFR 44 (L) >59 mL/min/1.73   BUN/Creatinine Ratio 12 9 - 23   Sodium 141 134 - 144 mmol/L   Potassium 4.0 3.5 - 5.2 mmol/L   Chloride 101 96 - 106 mmol/L   CO2 22 20 - 29 mmol/L   Calcium 9.5 8.7 - 10.2 mg/dL    RADIOGRAPHIC STUDIES: I have personally reviewed the radiological images as listed and agreed with the findings in the report. ECHOCARDIOGRAM COMPLETE  Result Date: 05/04/2022    ECHOCARDIOGRAM REPORT   Patient Name:   CHARLY HUNTON Date of Exam: 05/04/2022 Medical Rec #:  [id number]        Height:       65.0 in Accession #:    [id number]       Weight:       223.0 lb Date of Birth:  July 06, 1971         BSA:          2.072 m Patient Age:    29 years         BP:           161/91 mmHg Patient Gender: F                HR:           81 bpm. Exam Location:  [name] Procedure: 2D Echo, Cardiac Doppler and Color Doppler Indications:    CHF  History:        Patient has prior history of Echocardiogram examinations, most                 recent 08/29/2004. CHF; Risk Factors:Hypertension, Diabetes and                 Dyslipidemia.  Sonographer:    Wenda Low Referring Phys: [id number] OLADAPO ADEFESO IMPRESSIONS  1. Left ventricular ejection fraction, by estimation, is 45 to 50%. The left ventricle has mildly decreased function. The left ventricle has no regional wall motion abnormalities. The left ventricular internal cavity size was severely dilated. There is severe left ventricular hypertrophy. Left ventricular diastolic parameters are consistent with Grade II diastolic dysfunction (pseudonormalization). Elevated left atrial  pressure.  2. Right ventricular systolic function is normal. The right ventricular size is normal.  3. Left atrial size was  severely dilated.  4. Right atrial size was mildly dilated.  5. The mitral valve is normal in structure. Mild mitral valve regurgitation.  6. The aortic valve is tricuspid. Aortic valve regurgitation is not visualized. Aortic valve sclerosis is present, with no evidence of aortic valve stenosis.  7. The inferior vena cava is normal in size with greater than 50% respiratory variability, suggesting right atrial pressure of 3 mmHg. FINDINGS  Left Ventricle: Left ventricular ejection fraction, by estimation, is 45 to 50%. The left ventricle has mildly decreased function. The left ventricle has no regional wall motion abnormalities. The left ventricular internal cavity size was severely dilated. There is severe left ventricular hypertrophy. Left ventricular diastolic parameters are consistent with Grade II diastolic dysfunction (pseudonormalization). Elevated left atrial pressure. Right Ventricle: The right ventricular size is normal. Right vetricular wall thickness was not assessed. Right ventricular systolic function is normal. Left Atrium: Left atrial size was severely dilated. Right Atrium: Right atrial size was mildly dilated. Pericardium: There is no evidence of pericardial effusion. Mitral Valve: The mitral valve is normal in structure. Mild mitral valve regurgitation. MV peak gradient, 7.6 mmHg. The mean mitral valve gradient is 3.0 mmHg. Tricuspid Valve: The tricuspid valve is normal in structure. Tricuspid valve regurgitation is trivial. Aortic Valve: The aortic valve is tricuspid. Aortic valve regurgitation is not visualized. Aortic valve sclerosis is present, with no evidence of aortic valve stenosis. Aortic valve mean gradient measures 6.0 mmHg. Aortic valve peak gradient measures 10.6 mmHg. Aortic valve area, by VTI measures 2.12 cm. Pulmonic Valve: The pulmonic valve was normal in structure. Pulmonic valve regurgitation is not visualized. Aorta: The aortic root and ascending aorta are structurally normal,  with no evidence of dilitation. Venous: The inferior vena cava is normal in size with greater than 50% respiratory variability, suggesting right atrial pressure of 3 mmHg. IAS/Shunts: No atrial level shunt detected by color flow Doppler.  LEFT VENTRICLE PLAX 2D LVIDd:         6.07 cm      Diastology LVIDs:         3.70 cm      LV e' medial:    6.20 cm/s LV PW:         1.83 cm      LV E/e' medial:  13.8 LV IVS:        1.90 cm      LV e' lateral:   5.22 cm/s LVOT diam:     2.00 cm      LV E/e' lateral: 16.4 LV SV:         68 LV SV Index:   33 LVOT Area:     3.14 cm  LV Volumes (MOD) LV vol d, MOD A2C: 108.0 ml LV vol d, MOD A4C: 126.0 ml LV vol s, MOD A2C: 52.8 ml LV vol s, MOD A4C: 75.7 ml LV SV MOD A2C:     55.2 ml LV SV MOD A4C:     126.0 ml LV SV MOD BP:      57.5 ml RIGHT VENTRICLE RV Basal diam:  3.95 cm RV Mid diam:    3.30 cm RV S prime:     15.70 cm/s TAPSE (M-mode): 2.5 cm LEFT ATRIUM              Index        RIGHT ATRIUM  Index LA diam:        4.70 cm  2.27 cm/m   RA Area:     19.70 cm LA Vol (A2C):   110.0 ml 53.09 ml/m  RA Volume:   63.70 ml  30.75 ml/m LA Vol (A4C):   112.0 ml 54.06 ml/m LA Biplane Vol: 115.0 ml 55.51 ml/m  AORTIC VALVE                     PULMONIC VALVE AV Area (Vmax):    2.10 cm      PV Vmax:       1.01 m/s AV Area (Vmean):   2.13 cm      PV Peak grad:  4.1 mmHg AV Area (VTI):     2.12 cm AV Vmax:           163.00 cm/s AV Vmean:          111.000 cm/s AV VTI:            0.319 m AV Peak Grad:      10.6 mmHg AV Mean Grad:      6.0 mmHg LVOT Vmax:         109.00 cm/s LVOT Vmean:        75.100 cm/s LVOT VTI:          0.215 m LVOT/AV VTI ratio: 0.67  AORTA Ao Root diam: 2.90 cm Ao Asc diam:  3.10 cm MITRAL VALVE MV Area (PHT): 4.04 cm    SHUNTS MV Area VTI:   2.11 cm    Systemic VTI:  0.22 m MV Peak grad:  7.6 mmHg    Systemic Diam: 2.00 cm MV Mean grad:  3.0 mmHg MV Vmax:       1.38 m/s MV Vmean:      84.1 cm/s MV Decel Time: 188 msec MV E velocity: 85.70 cm/s MV A  velocity: 97.70 cm/s MV E/A ratio:  0.88 Dorris Carnes MD Electronically signed by Dorris Carnes MD Signature Date/Time: 05/04/2022/9:48:04 PM    Final    DG Chest Portable 1 View  Result Date: 05/04/2022 CLINICAL DATA:  51 year old female with history of chest pain. EXAM: PORTABLE CHEST 1 VIEW COMPARISON:  Chest x-ray 08/26/2004. FINDINGS: There is cephalization of the pulmonary vasculature, indistinctness of the interstitial markings, and patchy airspace disease throughout the lungs bilaterally suggestive of moderate pulmonary edema. Trace bilateral pleural effusions. No pneumothorax. Mild cardiomegaly. Upper mediastinal contours are within normal limits. IMPRESSION: 1. The appearance the chest is most suggestive of congestive heart failure, as above. 1. Electronically Signed   By: Vinnie Langton M.D.   On: 05/04/2022 05:15    ASSESSMENT:  Microcytic anemia: - Patient seen at the request of Dr. Moshe Cipro for severe microcytic anemia from iron deficiency and CKD. - She has heavy menses every 15 to 18 days for the last few years. - Denies any bleeding per rectum or melena. - She started taking iron tablet and multivitamin on 05/04/2022.  Tolerating reasonably well.  Has ice pica. - Colonoscopy (05/03/2017): Diverticulosis.   Social/family history: - Works at [employer] at [employer].  Non-smoker. - No family history of anemia or malignancy.   PLAN:  Microcytic anemia: - I have reviewed labs from 05/04/2022.  Ferritin was severely low at 3.  Hemoglobin is around 8.3. - She has severe microcytic anemia from iron deficiency in the setting of chronic kidney disease. - Recommend parenteral iron therapy with Venofer x3.  We discussed side  effects in detail. - We will also check for coexisting nutritional deficiencies with B12, MMA, copper, reticulocyte's, LDH, SPEP, serum free light chains. - We will see her back in 2 to 3 months with repeat CBC, ferritin and iron panel.  2.  Folic acid  deficiency: - Labs on 12/19/5850 shows folic acid 5. - She is taking multivitamin.   All questions were answered. The patient knows to call the clinic with any problems, questions or concerns.  Derek Jack, MD 05/26/22 9:51 AM  [facility] [phone]   I, Thana Ates, am acting as a scribe for Dr. Derek Jack.  I, Derek Jack MD, have reviewed the above documentation for accuracy and completeness, and I agree with the above.

## 2022-05-29 ENCOUNTER — Inpatient Hospital Stay (HOSPITAL_COMMUNITY): Payer: Managed Care, Other (non HMO)

## 2022-05-29 VITALS — BP 166/97 | HR 69 | Temp 98.5°F | Resp 18

## 2022-05-29 DIAGNOSIS — D509 Iron deficiency anemia, unspecified: Secondary | ICD-10-CM

## 2022-05-29 DIAGNOSIS — N1832 Chronic kidney disease, stage 3b: Secondary | ICD-10-CM

## 2022-05-29 DIAGNOSIS — D500 Iron deficiency anemia secondary to blood loss (chronic): Secondary | ICD-10-CM

## 2022-05-29 LAB — PROTEIN ELECTROPHORESIS, SERUM
A/G Ratio: 1.1 (ref 0.7–1.7)
Albumin ELP: 3.8 g/dL (ref 2.9–4.4)
Alpha-1-Globulin: 0.2 g/dL (ref 0.0–0.4)
Alpha-2-Globulin: 0.7 g/dL (ref 0.4–1.0)
Beta Globulin: 1 g/dL (ref 0.7–1.3)
Gamma Globulin: 1.6 g/dL (ref 0.4–1.8)
Globulin, Total: 3.6 g/dL (ref 2.2–3.9)
Total Protein ELP: 7.4 g/dL (ref 6.0–8.5)

## 2022-05-29 LAB — KAPPA/LAMBDA LIGHT CHAINS
Kappa free light chain: 43.2 mg/L — ABNORMAL HIGH (ref 3.3–19.4)
Kappa, lambda light chain ratio: 2.12 — ABNORMAL HIGH (ref 0.26–1.65)
Lambda free light chains: 20.4 mg/L (ref 5.7–26.3)

## 2022-05-29 MED ORDER — SODIUM CHLORIDE 0.9 % IV SOLN: Freq: Once | INTRAVENOUS | Status: AC

## 2022-05-29 MED ORDER — SODIUM CHLORIDE 0.9 % IV SOLN
300.0000 mg | Freq: Once | INTRAVENOUS | Status: AC
  Administered 2022-05-29: 300 mg via INTRAVENOUS
  Filled 2022-05-29: qty 300

## 2022-05-29 NOTE — Patient Instructions (Signed)
[facility]  Discharge Instructions: Thank you for choosing [facility] to provide your oncology and hematology care.  If you have a lab appointment with the Cancer Center, please come in thru the Main Entrance and check in at the main information desk.  Wear comfortable clothing and clothing appropriate for easy access to any Portacath or PICC line.   We strive to give you quality time with your provider. You may need to reschedule your appointment if you arrive late (15 or more minutes).  Arriving late affects you and other patients whose appointments are after yours.  Also, if you miss three or more appointments without notifying the office, you may be dismissed from the clinic at the provider's discretion.      For prescription refill requests, have your pharmacy contact our office and allow 72 hours for refills to be completed.    Today you received the following chemotherapy and/or immunotherapy agents Venofer      To help prevent nausea and vomiting after your treatment, we encourage you to take your nausea medication as directed.  BELOW ARE SYMPTOMS THAT SHOULD BE REPORTED IMMEDIATELY: *FEVER GREATER THAN 100.4 F (38 C) OR HIGHER *CHILLS OR SWEATING *NAUSEA AND VOMITING THAT IS NOT CONTROLLED WITH YOUR NAUSEA MEDICATION *UNUSUAL SHORTNESS OF BREATH *UNUSUAL BRUISING OR BLEEDING *URINARY PROBLEMS (pain or burning when urinating, or frequent urination) *BOWEL PROBLEMS (unusual diarrhea, constipation, pain near the anus) TENDERNESS IN MOUTH AND THROAT WITH OR WITHOUT PRESENCE OF ULCERS (sore throat, sores in mouth, or a toothache) UNUSUAL RASH, SWELLING OR PAIN  UNUSUAL VAGINAL DISCHARGE OR ITCHING   Items with * indicate a potential emergency and should be followed up as soon as possible or go to the Emergency Department if any problems should occur.  Please show the CHEMOTHERAPY ALERT CARD or IMMUNOTHERAPY ALERT CARD at check-in to the Emergency  Department and triage nurse.  Should you have questions after your visit or need to cancel or reschedule your appointment, please contact [facility] [phone]  and follow the prompts.  Office hours are 8:00 a.m. to 4:30 p.m. Monday - Friday. Please note that voicemails left after 4:00 p.m. may not be returned until the following business day.  We are closed weekends and major holidays. You have access to a nurse at all times for urgent questions. Please call the main number to the clinic [phone] and follow the prompts.  For any non-urgent questions, you may also contact your provider using MyChart. We now offer e-Visits for anyone 18 and older to request care online for non-urgent symptoms. For details visit [email or web address].   Also download the MyChart app! Go to the app store, search "MyChart", open the app, select [facility], and log in with your MyChart username and password.  Due to Covid, a mask is required upon entering the hospital/clinic. If you do not have a mask, one will be given to you upon arrival. For doctor visits, patients may have 1 support person aged 18 or older with them. For treatment visits, patients cannot have anyone with them due to current Covid guidelines and our immunocompromised population.  

## 2022-05-29 NOTE — Progress Notes (Signed)
Patient presents today for Venofer infusion per providers order.  Vital signs WNL.  Patient has no new complaints at this time.  Peripheral IV started and blood return noted pre and post infusion.  Venofer given today per MD orders.  TStable during infusion without adverse affects.  Vital signs stable.  No complaints at this time.  Discharge from clinic ambulatory in stable condition.  Alert and oriented X 3.  Follow up with [facility] as scheduled.

## 2022-05-30 LAB — METHYLMALONIC ACID, SERUM: Methylmalonic Acid, Quantitative: 116 nmol/L (ref 0–378)

## 2022-05-31 LAB — COPPER, SERUM: Copper: 122 ug/dL (ref 80–158)

## 2022-06-05 ENCOUNTER — Encounter (HOSPITAL_COMMUNITY): Payer: Self-pay

## 2022-06-05 ENCOUNTER — Inpatient Hospital Stay (HOSPITAL_COMMUNITY): Payer: Managed Care, Other (non HMO)

## 2022-06-05 VITALS — BP 173/103 | HR 77 | Temp 97.7°F | Resp 17

## 2022-06-05 DIAGNOSIS — D509 Iron deficiency anemia, unspecified: Secondary | ICD-10-CM

## 2022-06-05 DIAGNOSIS — D500 Iron deficiency anemia secondary to blood loss (chronic): Secondary | ICD-10-CM

## 2022-06-05 DIAGNOSIS — N1832 Chronic kidney disease, stage 3b: Secondary | ICD-10-CM

## 2022-06-05 MED ORDER — SODIUM CHLORIDE 0.9 % IV SOLN: Freq: Once | INTRAVENOUS | Status: AC

## 2022-06-05 MED ORDER — SODIUM CHLORIDE 0.9 % IV SOLN
300.0000 mg | Freq: Once | INTRAVENOUS | Status: AC
  Administered 2022-06-05: 300 mg via INTRAVENOUS
  Filled 2022-06-05: qty 5

## 2022-06-05 NOTE — Patient Instructions (Signed)
[facility]  Discharge Instructions: Thank you for choosing [facility] to provide your oncology and hematology care.  If you have a lab appointment with the Cancer Center, please come in thru the Main Entrance and check in at the main information desk.  Wear comfortable clothing and clothing appropriate for easy access to any Portacath or PICC line.   We strive to give you quality time with your provider. You may need to reschedule your appointment if you arrive late (15 or more minutes).  Arriving late affects you and other patients whose appointments are after yours.  Also, if you miss three or more appointments without notifying the office, you may be dismissed from the clinic at the provider's discretion.      For prescription refill requests, have your pharmacy contact our office and allow 72 hours for refills to be completed.    Iron Sucrose Injection What is this medication? IRON SUCROSE (EYE ern SOO krose) treats low levels of iron (iron deficiency anemia) in people with kidney disease. Iron is a mineral that plays an important role in making red blood cells, which carry oxygen from your lungs to the rest of your body. This medicine may be used for other purposes; ask your health care provider or pharmacist if you have questions. COMMON BRAND NAME(S): Venofer What should I tell my care team before I take this medication? They need to know if you have any of these conditions: Anemia not caused by low iron levels Heart disease High levels of iron in the blood Kidney disease Liver disease An unusual or allergic reaction to iron, other medications, foods, dyes, or preservatives Pregnant or trying to get pregnant Breast-feeding How should I use this medication? This medication is for infusion into a vein. It is given in a hospital or clinic setting. Talk to your care team about the use of this medication in children. While this medication may be prescribed  for children as young as 2 years for selected conditions, precautions do apply. Overdosage: If you think you have taken too much of this medicine contact a poison control center or emergency room at once. NOTE: This medicine is only for you. Do not share this medicine with others. What if I miss a dose? It is important not to miss your dose. Call your care team if you are unable to keep an appointment. What may interact with this medication? Do not take this medication with any of the following: Deferoxamine Dimercaprol Other iron products This medication may also interact with the following: Chloramphenicol Deferasirox This list may not describe all possible interactions. Give your health care provider a list of all the medicines, herbs, non-prescription drugs, or dietary supplements you use. Also tell them if you smoke, drink alcohol, or use illegal drugs. Some items may interact with your medicine. What should I watch for while using this medication? Visit your care team regularly. Tell your care team if your symptoms do not start to get better or if they get worse. You may need blood work done while you are taking this medication. You may need to follow a special diet. Talk to your care team. Foods that contain iron include: whole grains/cereals, dried fruits, beans, or peas, leafy green vegetables, and organ meats (liver, kidney). What side effects may I notice from receiving this medication? Side effects that you should report to your care team as soon as possible: Allergic reactions--skin rash, itching, hives, swelling of the face, lips, tongue,   or throat Low blood pressure--dizziness, feeling faint or lightheaded, blurry vision Shortness of breath Side effects that usually do not require medical attention (report to your care team if they continue or are bothersome): Flushing Headache Joint pain Muscle pain Nausea Pain, redness, or irritation at injection site This list may not  describe all possible side effects. Call your doctor for medical advice about side effects. You may report side effects to FDA at 1-800-FDA-1088. Where should I keep my medication? This medication is given in a hospital or clinic and will not be stored at home. NOTE: This sheet is a summary. It may not cover all possible information. If you have questions about this medicine, talk to your doctor, pharmacist, or health care provider.  2023 Elsevier/Gold Standard (2021-04-29 00:00:00)    To help prevent nausea and vomiting after your treatment, we encourage you to take your nausea medication as directed.  BELOW ARE SYMPTOMS THAT SHOULD BE REPORTED IMMEDIATELY: *FEVER GREATER THAN 100.4 F (38 C) OR HIGHER *CHILLS OR SWEATING *NAUSEA AND VOMITING THAT IS NOT CONTROLLED WITH YOUR NAUSEA MEDICATION *UNUSUAL SHORTNESS OF BREATH *UNUSUAL BRUISING OR BLEEDING *URINARY PROBLEMS (pain or burning when urinating, or frequent urination) *BOWEL PROBLEMS (unusual diarrhea, constipation, pain near the anus) TENDERNESS IN MOUTH AND THROAT WITH OR WITHOUT PRESENCE OF ULCERS (sore throat, sores in mouth, or a toothache) UNUSUAL RASH, SWELLING OR PAIN  UNUSUAL VAGINAL DISCHARGE OR ITCHING   Items with * indicate a potential emergency and should be followed up as soon as possible or go to the Emergency Department if any problems should occur.  Please show the CHEMOTHERAPY ALERT CARD or IMMUNOTHERAPY ALERT CARD at check-in to the Emergency Department and triage nurse.  Should you have questions after your visit or need to cancel or reschedule your appointment, please contact [facility] [phone]  and follow the prompts.  Office hours are 8:00 a.m. to 4:30 p.m. Monday - Friday. Please note that voicemails left after 4:00 p.m. may not be returned until the following business day.  We are closed weekends and major holidays. You have access to a nurse at all times for urgent questions. Please call  the main number to the clinic [phone] and follow the prompts.  For any non-urgent questions, you may also contact your provider using MyChart. We now offer e-Visits for anyone 18 and older to request care online for non-urgent symptoms. For details visit [email or web address].   Also download the MyChart app! Go to the app store, search "MyChart", open the app, select [facility], and log in with your MyChart username and password.  Masks are optional in the cancer centers. If you would like for your care team to wear a mask while they are taking care of you, please let them know. For doctor visits, patients may have with them one support person who is at least 52 years old. At this time, visitors are not allowed in the infusion area.  

## 2022-06-05 NOTE — Progress Notes (Signed)
Blood pressure elevated this morning. Patient has not taken her medication today. She will take them when she eats after the infusion.

## 2022-06-05 NOTE — Progress Notes (Signed)
Patient presents today for iron infusion.  Patient is in satisfactory condition with no complaints voiced.  Vital signs are stable.  We will proceed with treatment per MD orders.  Patient tolerated treatment well with no complaints voiced.  Patient left ambulatory in stable condition.  Vital signs stable at discharge.  Follow up as scheduled.    

## 2022-06-07 ENCOUNTER — Encounter (HOSPITAL_COMMUNITY): Payer: Managed Care, Other (non HMO) | Admitting: Hematology

## 2022-06-08 ENCOUNTER — Ambulatory Visit (INDEPENDENT_AMBULATORY_CARE_PROVIDER_SITE_OTHER): Payer: Managed Care, Other (non HMO) | Admitting: Gastroenterology

## 2022-06-08 ENCOUNTER — Other Ambulatory Visit (INDEPENDENT_AMBULATORY_CARE_PROVIDER_SITE_OTHER): Payer: Self-pay

## 2022-06-08 ENCOUNTER — Telehealth (INDEPENDENT_AMBULATORY_CARE_PROVIDER_SITE_OTHER): Payer: Self-pay

## 2022-06-08 ENCOUNTER — Encounter (INDEPENDENT_AMBULATORY_CARE_PROVIDER_SITE_OTHER): Payer: Self-pay | Admitting: Gastroenterology

## 2022-06-08 ENCOUNTER — Encounter (INDEPENDENT_AMBULATORY_CARE_PROVIDER_SITE_OTHER): Payer: Self-pay

## 2022-06-08 VITALS — BP 180/105 | HR 77 | Temp 98.6°F | Ht 65.0 in | Wt 222.2 lb

## 2022-06-08 DIAGNOSIS — D509 Iron deficiency anemia, unspecified: Secondary | ICD-10-CM

## 2022-06-08 DIAGNOSIS — R768 Other specified abnormal immunological findings in serum: Secondary | ICD-10-CM | POA: Diagnosis not present

## 2022-06-08 MED ORDER — PEG 3350-KCL-NA BICARB-NACL 420 G PO SOLR: 4000.0000 mL | ORAL | 0 refills | Status: DC

## 2022-06-08 NOTE — Patient Instructions (Signed)
Schedule EGD and colonoscopy Perform blood workup

## 2022-06-08 NOTE — Progress Notes (Signed)
Tracy Joseph, M.D. Gastroenterology & Hepatology [facility] [address] [place], [place] [address] Primary Care Physician: Tracy Helper, MD [address] [place] [address]  Referring MD: PCP  Chief Complaint: Positive hepatitis C antibody  History of Present Illness: Tracy Joseph is a 51 y.o. female with hypothyroidism, CKD, systolic/diastolic CHF EF 34-74%, , OSA, HTN, who presents for evaluation of possible hepatitis C.  The patient underwent blood work-up on 03/08/2022 which showed a positive hepatitis C virus antibody.  Unfortunately, the reflex studies were canceled.  Patient reports that she has felt well and denies having any complaints.  Did not know about any history of hepatitis C in the past. Never had transfusions, tattoos or has done any done any drugs. Has been married for 25 years, is monogamous.  The patient denies having any nausea, vomiting, fever, chills, hematochezia, melena, hematemesis, abdominal distention, abdominal pain, diarrhea, jaundice, pruritus. Has been losing some weight as she has been on a diet.  She has been following Dr. Worthy Keeler for iron deficiency anemia, is on oral iron daily and a 50k weekly dosing orally. She has a history of heavy menses.  Last labs 05/05/2022 showed normal AST of 10, ALT of 7, total bilirubin 0.6, alkaline phosphatase 68, albumin 3.4.  Last TSH 21.4 3 months ago, her thyroid medication was adjusted after this.  Last QVZ:DGLOVF  Last Colonoscopy:2018 The perianal and digital rectal examinations were normal. Scattered medium-mouthed diverticula were found in the sigmoid colon. The exam was otherwise normal throughout the examined colon. The retroflexed view of the distal rectum and anal verge was normal and showed no anal or rectal abnormalities.  Recommended repeat in 1 year due to poor prep  FHx: neg for any gastrointestinal/liver disease,  grandmother throat cancer Social: neg smoking, alcohol or illicit drug use Surgical: no abdominal surgeries  Past Medical History: Past Medical History:  Diagnosis Date   Essential hypertension    Goiter    Initially hyperthyroid treated with RAI, now hypothyroid - follows with Dr. [name]   Hypertension    Phreesia 10/12/2020   Obstructive sleep apnea    Suspected    Prediabetes    Seasonal allergies    Tubular adenoma of colon     Past Surgical History: Past Surgical History:  Procedure Laterality Date   COLONOSCOPY N/A 05/03/2017   Procedure: COLONOSCOPY;  Surgeon: Rogene Houston, MD;  [facility];  Service: Endoscopy;  Laterality: N/A;  50   TUBAL LIGATION  2005    Family History: Family History  Problem Relation Age of Onset   Hypertension Mother    Hyperlipidemia Mother    Thyroid disease Paternal Grandmother    Coronary artery disease Maternal Aunt    Coronary artery disease Maternal Aunt     Social History: Social History   Tobacco Use  Smoking Status Never  Smokeless Tobacco Never   Social History   Substance and Sexual Activity  Alcohol Use No   Social History   Substance and Sexual Activity  Drug Use No    Allergies: Allergies  Allergen Reactions   Ace Inhibitors Cough   Dust Mite Extract    Pollen Extract     Medications: Current Outpatient Medications  Medication Sig Dispense Refill   amLODipine (NORVASC) 10 MG tablet Take 1 tablet (10 mg total) by mouth daily. 90 tablet 3   cloNIDine (CATAPRES - DOSED IN MG/24 HR) 0.2 mg/24hr patch Place  1 patch (0.2 mg total) onto the skin once a week. Change every Friday 4 patch 12   ferrous sulfate 325 (65 FE) MG EC tablet Take 1 tablet (325 mg total) by mouth daily with breakfast. 90 tablet 3   furosemide (LASIX) 40 MG tablet Take 1 tablet (40 mg total) by mouth daily. 90 tablet 3   hydrALAZINE (APRESOLINE) 100 MG tablet Take 1 tablet (100 mg total) by mouth 3 (three) times daily.  270 tablet 2   isosorbide mononitrate (IMDUR) 60 MG 24 hr tablet Take 1 tablet (60 mg total) by mouth daily. 90 tablet 3   labetalol (NORMODYNE) 300 MG tablet Take 1 tablet (300 mg total) by mouth 2 (two) times daily. For BP 180 tablet 3   levothyroxine (SYNTHROID) 175 MCG tablet TAKE ONE AND A HALF TABLETS EVERY MONDAY, WEDNESDAY AND FRIDAY, BY MOUTH. TAKE ONE TABLET ONCE DAILY EVERY TUESDAY, THURSDAY, SATURDAY AND SUNDAY (Patient taking differently: Take 175-262.5 mcg by mouth See admin instructions. Take one and a half tablets(262.5mg) every Monday, Wednesday and Friday, by mouth. Take one tablet(1782m) once daily every Tuesday, Thursday, Saturday and Sunday) 108 tablet 1   norethindrone (AYGESTIN) 5 MG tablet Take 1 tablet (5 mg total) by mouth daily. 90 tablet 4   vitamin B-12 (CYANOCOBALAMIN) 1000 MCG tablet Take 1 tablet (1,000 mcg total) by mouth daily. 30 tablet 3   Vitamin D, Ergocalciferol, (DRISDOL) 1.25 MG (50000 UNIT) CAPS capsule Take 50,000 Units by mouth once a week.     No current facility-administered medications for this visit.    Review of Systems: GENERAL: negative for malaise, night sweats HEENT: No changes in hearing or vision, no nose bleeds or other nasal problems. NECK: Negative for lumps, goiter, pain and significant neck swelling RESPIRATORY: Negative for cough, wheezing CARDIOVASCULAR: Negative for chest pain, leg swelling, palpitations, orthopnea GI: SEE HPI MUSCULOSKELETAL: Negative for joint pain or swelling, back pain, and muscle pain. SKIN: Negative for lesions, rash PSYCH: Negative for sleep disturbance, mood disorder and recent psychosocial stressors. HEMATOLOGY Negative for prolonged bleeding, bruising easily, and swollen nodes. ENDOCRINE: Negative for cold or heat intolerance, polyuria, polydipsia and goiter. NEURO: negative for tremor, gait imbalance, syncope and seizures. The remainder of the review of systems is noncontributory.   Physical  Exam: BP (!) 180/105 (BP Location: Right Arm, Patient Position: Sitting, Cuff Size: Large)   Pulse 77   Temp 98.6 F (37 C) (Oral)   Ht '5\' 5"'$  (1.651 m)   Wt 222 lb 3.2 oz (100.8 kg)   BMI 36.98 kg/m  GENERAL: The patient is AO x3, in no acute distress.  Obese. HEENT: Head is normocephalic and atraumatic. EOMI are intact. Mouth is well hydrated and without lesions. NECK: Supple. No masses LUNGS: Clear to auscultation. No presence of rhonchi/wheezing/rales. Adequate chest expansion HEART: RRR, normal s1 and s2. ABDOMEN: Soft, nontender, no guarding, no peritoneal signs, and nondistended. BS +. No masses. EXTREMITIES: Without any cyanosis, clubbing, rash, lesions or edema. NEUROLOGIC: AOx3, no focal motor deficit. SKIN: no jaundice, no rashes   Imaging/Labs: as above  I personally reviewed and interpreted the available labs, imaging and endoscopic files.  Impression and Plan: DeDANIESHA DRIVERs a [age].o. female with hypothyroidism, CKD, systolic/diastolic CHF EF 4544-31%, OSA, HTN, who presents for evaluation of possible hepatitis C.  The patient was found to have a positive hepatitis C antibody during screening in her physical recently.  She has not presented any symptoms and has not presented  any extraintestinal manifestations of hepatitis C.  We will confirm if she has active hepatitis C with serologies.  If positive, she will need to undergo liver elastography to evaluate the degree of fibrosis.  Finally, she has presented a chronic history of iron deficiency anemia which has been treated with iron supplementation.  Part of it could be related to GYN losses but given her poor prep during her most recent colonoscopy, we will schedule her for a colonoscopy.  An EGD will be performed at the same time to rule out upper GI losses.  - Schedule EGD and colonoscopy with a 2-day prep -Check hepatitis C viral load, genotype, hepatitis B core antibody and surface antigen, HIV - If positive  hepatitis C viral load, we will need to proceed with liver elastography prior to starting her on DAA  All questions were answered.      Tracy Peppers, MD Gastroenterology and Hepatology [facility]

## 2022-06-08 NOTE — Telephone Encounter (Signed)
Tenelle Andreason Ann Aarushi Hemric, CMA  ?

## 2022-06-09 ENCOUNTER — Encounter (INDEPENDENT_AMBULATORY_CARE_PROVIDER_SITE_OTHER): Payer: Self-pay

## 2022-06-12 ENCOUNTER — Inpatient Hospital Stay (HOSPITAL_COMMUNITY): Payer: Managed Care, Other (non HMO)

## 2022-06-17 LAB — HCV RNA, QUANT REAL-TIME PCR W/REFLEX
HCV RNA, PCR, QN (Log): <1.18 LogIU/mL
HCV RNA, PCR, QN: <15 IU/mL

## 2022-06-17 LAB — HEPATITIS B CORE ANTIBODY, TOTAL: Hep B Core Total Ab: NONREACTIVE

## 2022-06-17 LAB — HEPATITIS B SURFACE ANTIGEN: Hepatitis B Surface Ag: NONREACTIVE

## 2022-06-17 LAB — HIV ANTIBODY (ROUTINE TESTING W REFLEX): HIV 1&2 Ab, 4th Generation: NONREACTIVE

## 2022-06-21 ENCOUNTER — Ambulatory Visit (INDEPENDENT_AMBULATORY_CARE_PROVIDER_SITE_OTHER): Payer: Managed Care, Other (non HMO) | Admitting: "Endocrinology

## 2022-06-21 ENCOUNTER — Encounter: Payer: Self-pay | Admitting: "Endocrinology

## 2022-06-21 VITALS — BP 146/88 | HR 80 | Ht 65.0 in | Wt 219.2 lb

## 2022-06-21 DIAGNOSIS — E890 Postprocedural hypothyroidism: Secondary | ICD-10-CM | POA: Diagnosis not present

## 2022-06-21 DIAGNOSIS — E782 Mixed hyperlipidemia: Secondary | ICD-10-CM

## 2022-06-21 DIAGNOSIS — R7303 Prediabetes: Secondary | ICD-10-CM | POA: Diagnosis not present

## 2022-06-21 DIAGNOSIS — Z6836 Body mass index (BMI) 36.0-36.9, adult: Secondary | ICD-10-CM

## 2022-06-21 MED ORDER — LEVOTHYROXINE SODIUM 175 MCG PO TABS: 175.0000 ug | ORAL_TABLET | Freq: Every day | ORAL | 1 refills | Status: DC

## 2022-06-21 NOTE — Progress Notes (Signed)
Endocrinology Consult Note                                            06/21/2022, 12:37 PM   Subjective:    Patient ID: Tracy Joseph, female    DOB: June 28, 1971, PCP [name], MD   Past Medical History:  Diagnosis Date   Essential hypertension    Goiter    Initially hyperthyroid treated with RAI, now hypothyroid - follows with Dr. [name]   Hypertension    Phreesia 10/12/2020   Obstructive sleep apnea    Suspected    Prediabetes    Seasonal allergies    Tubular adenoma of colon    Past Surgical History:  Procedure Laterality Date   COLONOSCOPY N/A 05/03/2017   Procedure: COLONOSCOPY;  Surgeon: Rogene Houston, MD;  [facility];  Service: Endoscopy;  Laterality: N/A;  9   TUBAL LIGATION  2005   Social History   Socioeconomic History   Marital status: Married    Spouse name: Not on file   Number of children: 1   Years of education: Not on file   Highest education level: Not on file  Occupational History   Occupation: full time student for [employer]  Tobacco Use   Smoking status: Never   Smokeless tobacco: Never  Vaping Use   Vaping Use: Never used  Substance and Sexual Activity   Alcohol use: No   Drug use: No   Sexual activity: Yes    Birth control/protection: None  Other Topics Concern   Not on file  Social History Narrative   Not on file   Social Determinants of Health   Financial Resource Strain: Low Risk  (03/29/2022)   Overall Financial Resource Strain (CARDIA)    Difficulty of Paying Living Expenses: Not hard at all  Food Insecurity: No Food Insecurity (03/29/2022)   Hunger Vital Sign    Worried About Running Out of Food in the Last Year: Never true    [place] in the Last Year: Never true  Transportation Needs: No Transportation Needs (03/29/2022)   PRAPARE - Hydrologist (Medical): No    Lack of Transportation (Non-Medical): No  Physical Activity: Sufficiently Active (03/29/2022)    Exercise Vital Sign    Days of Exercise per Week: 3 days    Minutes of Exercise per Session: 60 min  Stress: No Stress Concern Present (03/29/2022)   [place]    Feeling of Stress : Not at all  Social Connections: [place] (03/29/2022)   Social Connection and Isolation Panel [NHANES]    Frequency of Communication with Friends and Family: Twice a week    Frequency of Social Gatherings with Friends and Family: Twice a week    Attends Religious Services: More than 4 times per year    Active Member of [employer] or Organizations: Yes    Attends Archivist Meetings: 1 to 4 times per year    Marital Status: Married   Family History  Problem Relation Age of Onset   Hypertension Mother    Hyperlipidemia Mother    Thyroid disease Paternal Grandmother    Coronary artery disease Maternal Aunt    Coronary artery disease Maternal Aunt    Outpatient Encounter Medications as of 06/21/2022  Medication Sig   Multiple Vitamin (MULTIVITAMIN ADULT PO) Take 1 tablet by mouth daily.   amLODipine (NORVASC) 10 MG tablet Take 1 tablet (10 mg total) by mouth daily.   cloNIDine (CATAPRES - DOSED IN MG/24 HR) 0.2 mg/24hr patch Place 1 patch (0.2 mg total) onto the skin once a week. Change every Friday   ferrous sulfate 325 (65 FE) MG EC tablet Take 1 tablet (325 mg total) by mouth daily with breakfast.   furosemide (LASIX) 40 MG tablet Take 1 tablet (40 mg total) by mouth daily.   hydrALAZINE (APRESOLINE) 100 MG tablet Take 1 tablet (100 mg total) by mouth 3 (three) times daily.   isosorbide mononitrate (IMDUR) 60 MG 24 hr tablet Take 1 tablet (60 mg total) by mouth daily.   labetalol (NORMODYNE) 300 MG tablet Take 1 tablet (300 mg total) by mouth 2 (two) times daily. For BP   levothyroxine (SYNTHROID) 175 MCG tablet Take 1 tablet (175 mcg total) by mouth daily before breakfast.   norethindrone (AYGESTIN) 5 MG tablet Take 1 tablet  (5 mg total) by mouth daily.   polyethylene glycol-electrolytes (TRILYTE) 420 g solution Take 4,000 mLs by mouth as directed.   vitamin B-12 (CYANOCOBALAMIN) 1000 MCG tablet Take 1 tablet (1,000 mcg total) by mouth daily.   Vitamin D, Ergocalciferol, (DRISDOL) 1.25 MG (50000 UNIT) CAPS capsule Take 50,000 Units by mouth once a week.   [DISCONTINUED] levothyroxine (SYNTHROID) 175 MCG tablet TAKE ONE AND A HALF TABLETS EVERY MONDAY, WEDNESDAY AND FRIDAY, BY MOUTH. TAKE ONE TABLET ONCE DAILY EVERY TUESDAY, THURSDAY, SATURDAY AND SUNDAY (Patient taking differently: Take 175-262.5 mcg by mouth See admin instructions. Take one and a half tablets(262.6mg) every Monday, Wednesday and Friday, by mouth. Take one tablet(1727m) once daily every Tuesday, Thursday, Saturday and Sunday)   No facility-administered encounter medications on file as of 06/21/2022.   ALLERGIES: Allergies  Allergen Reactions   Ace Inhibitors Cough   Dust Mite Extract    Pollen Extract     VACCINATION STATUS: Immunization History  Administered Date(s) Administered   Influenza,inj,Quad PF,6+ Mos 10/12/2020, 11/08/2021   PFIZER(Purple Top)SARS-COV-2 Vaccination 03/25/2020, 04/23/2020, 11/23/2020   Pneumococcal Polysaccharide-23 12/22/2020   Tdap 04/06/2015    HPI Nicci M KAITHLYN TEAGLEs [age].o. female who presents today with a medical history as above. she is being seen in RAI induced hypothyroidism consultation for RAI induced hypothyroidism requested by SiFayrene HelperMD.  History is obtained directly from the patient as well as chart review.  According to her records, she underwent RAI thyroid ablation for hyperthyroidism in 2016.  She was given various doses of levothyroxine over the years.  She is currently on 175 mcg of levothyroxine as follows: 1/2 tablet 3 days a week, 1 tablet 4 days a week. He does not have acute symptoms today.  She reports fluctuating body weight.  She denies dysphagia, shortness of breath, nor  voice change.  She denies tremors, palpitation, normal heat intolerance.  Her medical history also includes hyperlipidemia with LDL of 140, not currently on treatment.  Review of Systems  Constitutional: + Fluctuating body weight, no fatigue, no subjective hyperthermia, no subjective hypothermia Eyes: no blurry vision, no xerophthalmia ENT: no sore throat, no nodules palpated in throat, no dysphagia/odynophagia, no hoarseness Cardiovascular: no Chest Pain, no Shortness of Breath, no palpitations, no leg swelling Respiratory: no cough, no shortness of breath Gastrointestinal: no Nausea/Vomiting/Diarhhea Musculoskeletal: no muscle/joint aches Skin: no rashes Neurological: no tremors, no numbness, no tingling, no dizziness  Psychiatric: no depression, no anxiety  Objective:       06/21/2022    8:32 AM 06/08/2022    8:26 AM 06/08/2022    8:25 AM  Vitals with BMI  Height '5\' 5"'$   '5\' 5"'$   Weight 219 lbs 3 oz  222 lbs 3 oz  BMI 73.41  93.79  Systolic 024 097 353  Diastolic 88 299 242  Pulse 80 77 80    BP (!) 146/88   Pulse 80   Ht '5\' 5"'$  (1.651 m)   Wt 219 lb 3.2 oz (99.4 kg)   BMI 36.48 kg/m   Wt Readings from Last 3 Encounters:  06/21/22 219 lb 3.2 oz (99.4 kg)  06/08/22 222 lb 3.2 oz (100.8 kg)  05/26/22 218 lb 6.4 oz (99.1 kg)    Physical Exam  Constitutional:  Body mass index is 36.48 kg/m.,  not in acute distress, normal state of mind Eyes: PERRLA, EOMI, no exophthalmos ENT: moist mucous membranes, no gross thyromegaly, no gross cervical lymphadenopathy Cardiovascular: normal precordial activity, Regular Rate and Rhythm, no Murmur/Rubs/Gallops Respiratory:  adequate breathing efforts, no gross chest deformity, Clear to auscultation bilaterally Gastrointestinal: abdomen soft, Non -tender, No distension, Bowel Sounds present, no gross organomegaly Musculoskeletal: no gross deformities, strength intact in all four extremities Skin: moist, warm, no rashes Neurological: no  tremor with outstretched hands, Deep tendon reflexes normal in bilateral lower extremities.  CMP ( most recent) CMP     Component Value Date/Time   NA 141 05/17/2022 1023   K 4.0 05/17/2022 1023   CL 101 05/17/2022 1023   CO2 22 05/17/2022 1023   GLUCOSE 99 05/17/2022 1023   GLUCOSE 115 (H) 05/07/2022 0358   BUN 17 05/17/2022 1023   CREATININE 1.44 (H) 05/17/2022 1023   CREATININE 1.14 (H) 09/16/2019 0847   CALCIUM 9.5 05/17/2022 1023   PROT 6.7 05/05/2022 0324   PROT 7.2 03/08/2022 0930   ALBUMIN 3.4 (L) 05/06/2022 0136   ALBUMIN 4.2 03/08/2022 0930   AST 10 (L) 05/05/2022 0324   ALT 7 05/05/2022 0324   ALKPHOS 68 05/05/2022 0324   BILITOT 0.6 05/05/2022 0324   BILITOT 0.3 03/08/2022 0930   GFRNONAA 39 (L) 05/07/2022 0358   GFRNONAA 56 (L) 09/16/2019 0847   GFRAA 53 (L) 11/10/2020 0938   GFRAA 65 09/16/2019 0847     Diabetic Labs (most recent): Lab Results  Component Value Date   HGBA1C 6.1 03/08/2022   HGBA1C 5.9 04/26/2021   HGBA1C 6.1 (A) 10/12/2020     Lipid Panel ( most recent) Lipid Panel     Component Value Date/Time   CHOL 214 (H) 03/08/2022 0930   TRIG 135 03/08/2022 0930   HDL 50 03/08/2022 0930   CHOLHDL 4.3 03/08/2022 0930   CHOLHDL 4.7 09/16/2019 0847   VLDL 23 01/24/2017 0916   LDLCALC 140 (H) 03/08/2022 0930   LDLCALC 136 (H) 09/16/2019 0847   LABVLDL 24 03/08/2022 0930      Lab Results  Component Value Date   TSH 2.640 05/17/2022   TSH 21.400 (H) 03/08/2022   TSH 1.460 04/26/2021   TSH 5.99 (H) 04/29/2019   TSH 5.25 (H) 07/05/2017   TSH 6.50 (H) 05/07/2017   TSH 10.37 (H) 01/24/2017   TSH 7.66 (H) 04/24/2016   TSH 13.323 (H) 12/15/2015   TSH 39.351 (H) 04/06/2015   FREET4 0.31 (L) 04/06/2015           Assessment & Plan:   1. Hypothyroidism following radioiodine therapy  2. Prediabetes 3. Mixed hyperlipidemia   - DUSTIE BRITTLE  is being seen at a kind request of [name], MD. - I have reviewed her  available thyroid records and clinically evaluated the patient. - Based on these reviews, she has RAI induced hypothyroidism. I advised her to maintain consistent intake of levothyroxine 175 mcg p.o. daily before breakfast.    - We discussed about the correct intake of her thyroid hormone, on empty stomach at fasting, with water, separated by at least 30 minutes from breakfast and other medications,  and separated by more than 4 hours from calcium, iron, multivitamins, acid reflux medications (PPIs). -Patient is made aware of the fact that thyroid hormone replacement is needed for life, dose to be adjusted by periodic monitoring of thyroid function tests.  Regarding her dyslipidemia will has prediabetes, hesitant to go on any statins.  She is a great candidate for lifestyle medicine.  - she acknowledges that there is a room for improvement in her food and drink choices. - Suggestion is made for her to avoid simple carbohydrates  from her diet including Cakes, Sweet Desserts, Ice Cream, Soda (diet and regular), Sweet Tea, Candies, Chips, Cookies, Store Bought Juices, Alcohol , Artificial Sweeteners,  Coffee Creamer, and "Sugar-free" Products, Lemonade. This will help patient to have more stable blood glucose profile and potentially avoid unintended weight gain.  The following Lifestyle Medicine recommendations according to [facility]) were discussed and and offered to patient and she  agrees to start the journey:  A. Whole Foods, Plant-Based Nutrition comprising of fruits and vegetables, plant-based proteins, whole-grain carbohydrates was discussed in detail with the patient.   A list for source of those nutrients were also provided to the patient.  Patient will use only water or unsweetened tea for hydration. B.  The need to stay away from risky substances including alcohol, smoking; obtaining 7 to 9 hours of restorative sleep, at least 150 minutes of moderate  intensity exercise weekly, the importance of healthy social connections,  and stress management techniques were discussed. C.  A full color page of  Calorie density of various food groups per pound showing examples of each food groups was provided to the patient.  Risk reduction counseling performed per USPSTF guidelines to reduce obesity and cardiovascular risk factors.    - she is advised to maintain close follow up with [name], MD for primary care needs.   - Time spent with the patient: 50 minutes, of which >50% was spent in  counseling her about her hypothyroidism, hyperlipidemia, prediabetes and the rest in obtaining information about her symptoms, reviewing her previous labs/studies ( including abstractions from other facilities),  evaluations, and treatments,  and developing a plan to confirm diagnosis and long term treatment based on the latest standards of care/guidelines; and documenting her care.  Tracy Joseph participated in the discussions, expressed understanding, and voiced agreement with the above plans.  All questions were answered to her satisfaction. she is encouraged to contact clinic should she have any questions or concerns prior to her return visit.  Follow up plan: Return in about 3 months (around 09/21/2022) for F/U with Pre-visit Labs.   Glade Lloyd, MD [facility] [address] [place], [place] [address] Phone: [phone]  Fax: [phone]     06/21/2022, 12:37 PM  This note was partially dictated with voice recognition software. Similar sounding words can be transcribed inadequately or  may not  be corrected upon review.

## 2022-06-21 NOTE — Patient Instructions (Signed)

## 2022-06-22 ENCOUNTER — Inpatient Hospital Stay (HOSPITAL_COMMUNITY): Payer: Managed Care, Other (non HMO) | Attending: Hematology

## 2022-06-22 ENCOUNTER — Encounter (HOSPITAL_COMMUNITY): Payer: Self-pay

## 2022-06-22 VITALS — BP 178/112 | HR 70 | Temp 98.4°F | Resp 20

## 2022-06-22 DIAGNOSIS — D509 Iron deficiency anemia, unspecified: Secondary | ICD-10-CM | POA: Diagnosis not present

## 2022-06-22 DIAGNOSIS — D500 Iron deficiency anemia secondary to blood loss (chronic): Secondary | ICD-10-CM

## 2022-06-22 DIAGNOSIS — E538 Deficiency of other specified B group vitamins: Secondary | ICD-10-CM | POA: Insufficient documentation

## 2022-06-22 DIAGNOSIS — N1832 Chronic kidney disease, stage 3b: Secondary | ICD-10-CM

## 2022-06-22 MED ORDER — SODIUM CHLORIDE 0.9 % IV SOLN
400.0000 mg | Freq: Once | INTRAVENOUS | Status: AC
  Administered 2022-06-22: 400 mg via INTRAVENOUS
  Filled 2022-06-22: qty 20

## 2022-06-22 MED ORDER — SODIUM CHLORIDE 0.9 % IV SOLN: Freq: Once | INTRAVENOUS | Status: AC

## 2022-06-22 NOTE — Patient Instructions (Signed)
[facility]  Discharge Instructions: Thank you for choosing [facility] to provide your oncology and hematology care.  If you have a lab appointment with the [facility], please come in thru the Main Entrance and check in at the main information desk.  Wear comfortable clothing and clothing appropriate for easy access to any Portacath or PICC line.   We strive to give you quality time with your provider. You may need to reschedule your appointment if you arrive late (15 or more minutes).  Arriving late affects you and other patients whose appointments are after yours.  Also, if you miss three or more appointments without notifying the office, you may be dismissed from the clinic at the provider's discretion.      For prescription refill requests, have your pharmacy contact our office and allow 72 hours for refills to be completed.    Today you received venofer infusion.      To help prevent nausea and vomiting after your treatment, we encourage you to take your nausea medication as directed.  BELOW ARE SYMPTOMS THAT SHOULD BE REPORTED IMMEDIATELY: *FEVER GREATER THAN 100.4 F (38 C) OR HIGHER *CHILLS OR SWEATING *NAUSEA AND VOMITING THAT IS NOT CONTROLLED WITH YOUR NAUSEA MEDICATION *UNUSUAL SHORTNESS OF BREATH *UNUSUAL BRUISING OR BLEEDING *URINARY PROBLEMS (pain or burning when urinating, or frequent urination) *BOWEL PROBLEMS (unusual diarrhea, constipation, pain near the anus) TENDERNESS IN MOUTH AND THROAT WITH OR WITHOUT PRESENCE OF ULCERS (sore throat, sores in mouth, or a toothache) UNUSUAL RASH, SWELLING OR PAIN  UNUSUAL VAGINAL DISCHARGE OR ITCHING   Items with * indicate a potential emergency and should be followed up as soon as possible or go to the Emergency Department if any problems should occur.  Please show the CHEMOTHERAPY ALERT CARD or IMMUNOTHERAPY ALERT CARD at check-in to the Emergency Department and triage nurse.  Should you have  questions after your visit or need to cancel or reschedule your appointment, please contact [facility] [phone]  and follow the prompts.  Office hours are 8:00 a.m. to 4:30 p.m. Monday - Friday. Please note that voicemails left after 4:00 p.m. may not be returned until the following business day.  We are closed weekends and major holidays. You have access to a nurse at all times for urgent questions. Please call the main number to the clinic [phone] and follow the prompts.  For any non-urgent questions, you may also contact your provider using MyChart. We now offer e-Visits for anyone 69 and older to request care online for non-urgent symptoms. For details visit [email or web address].   Also download the MyChart app! Go to the app store, search "MyChart", open the app, select [facility], and log in with your MyChart username and password.  Masks are optional in the cancer centers. If you would like for your care team to wear a mask while they are taking care of you, please let them know. For doctor visits, patients may have with them one support person who is at least 52 years old. At this time, visitors are not allowed in the infusion area.

## 2022-06-22 NOTE — Progress Notes (Signed)
Blood pressure at discharge 178/112. Reported blood pressure to Dr. Delton Coombes. Patient states she did not take her blood pressure medication prior to arrival. Patient denies any headache, blurred vision or dizziness. Patient declines staying to take blood pressure medication and requests IV to be removed and she be discharged. Patient states she will take her medication once she is home and recheck blood pressure and call the clinic back. Verbal orders from Dr. Delton Coombes to discharge patient home.

## 2022-07-05 ENCOUNTER — Telehealth: Payer: Self-pay

## 2022-07-05 NOTE — Telephone Encounter (Signed)
Appt scheduled

## 2022-07-05 NOTE — Telephone Encounter (Signed)
Patient called said more leg swelling when works out last night walking on treadmill and elliptical but not all the time.  What does she need to do, just had TOC end of May 2023.  Call back # [phone].

## 2022-07-12 ENCOUNTER — Ambulatory Visit: Payer: Managed Care, Other (non HMO) | Admitting: Obstetrics & Gynecology

## 2022-07-12 ENCOUNTER — Ambulatory Visit (INDEPENDENT_AMBULATORY_CARE_PROVIDER_SITE_OTHER): Payer: Managed Care, Other (non HMO) | Admitting: Family Medicine

## 2022-07-12 ENCOUNTER — Encounter: Payer: Self-pay | Admitting: Family Medicine

## 2022-07-12 VITALS — BP 136/80 | HR 88 | Ht 65.0 in | Wt 222.1 lb

## 2022-07-12 DIAGNOSIS — R7303 Prediabetes: Secondary | ICD-10-CM

## 2022-07-12 DIAGNOSIS — I5040 Unspecified combined systolic (congestive) and diastolic (congestive) heart failure: Secondary | ICD-10-CM | POA: Diagnosis not present

## 2022-07-12 DIAGNOSIS — E119 Type 2 diabetes mellitus without complications: Secondary | ICD-10-CM

## 2022-07-12 DIAGNOSIS — E890 Postprocedural hypothyroidism: Secondary | ICD-10-CM

## 2022-07-12 DIAGNOSIS — E782 Mixed hyperlipidemia: Secondary | ICD-10-CM

## 2022-07-12 DIAGNOSIS — I10 Essential (primary) hypertension: Secondary | ICD-10-CM

## 2022-07-12 NOTE — Patient Instructions (Signed)
Follow-up early November, call if you need me sooner  Keep up great work!  Fasting lipid, cmp and eGFR asnd HBA1C  5 days before November visit  Reconsider shingrix vaccines  Thanks for choosing [facility], we consider it a privelige to serve you.  

## 2022-07-12 NOTE — Progress Notes (Signed)
Tracy Joseph     MRN: [id number]      DOB: 1971/04/03   HPI Tracy Joseph is here for follow up and re-evaluation of chronic medical conditions, medication management and review of any available recent lab and radiology data.  Preventive health is updated, specifically  Cancer screening and Immunization.   Questions or concerns regarding consultations or procedures which the PT has had in the interim are  addressed.Has had GI, nephrology and [facility] consults since last visit The PT denies any adverse reactions to current medications since the last visit.  2 episodes of bilateral swelling of feet after exercise Following changed eating plan and committed to mor regular exercise   ROS Denies recent fever or chills. Denies sinus pressure, nasal congestion, ear pain or sore throat. Denies chest congestion, productive cough or wheezing. Denies chest pains, palpitations , PND or orthopnea Denies abdominal pain, nausea, vomiting,diarrhea or constipation.   Denies dysuria, frequency, hesitancy or incontinence. Denies joint pain, swelling and limitation in mobility. Denies headaches, seizures, numbness, or tingling. Denies depression, anxiety or insomnia. Denies skin break down or rash.   PE  BP 136/80   Pulse 88   Ht 5\' 5"  (1.651 m)   Wt 222 lb 1.9 oz (100.8 kg)   SpO2 95%   BMI 36.96 kg/m   Patient alert and oriented and in no cardiopulmonary distress.  HEENT: No facial asymmetry, EOMI,     Neck supple .  Chest: Clear to auscultation bilaterally.  CVS: S1, S2 no murmurs, no S3.Regular rate.  ABD: Soft non tender.   Ext: No edema  MS: Adequate ROM spine, shoulders, hips and knees.  Skin: Intact, no ulcerations or rash noted.  Psych: Good eye contact, normal affect. Memory intact not anxious or depressed appearing.  CNS: CN 2-12 intact, power,  normal throughout.no focal deficits noted.   Assessment & Plan Malignant hypertension Controlled, no change in  medication DASH diet and commitment to daily physical activity for a minimum of 30 minutes discussed and encouraged, as a part of hypertension management. The importance of attaining a healthy weight is also discussed.     07/12/2022    9:56 AM 07/12/2022    9:29 AM 07/12/2022    9:28 AM 06/22/2022   12:40 PM 06/22/2022    8:32 AM 06/21/2022    8:32 AM 06/08/2022    8:26 AM  BP/Weight  Systolic BP 136 140 142 178 157 146 180  Diastolic BP 80 80 83 112 105 88 105  Wt. 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   The need to take medication as prescribed, test blood sugar as directed, and to call between visits if there is a concern that blood sugar is uncontrolled is also discussed.   Tracy Joseph is reminded of the importance of daily foot exam, annual eye examination, and good blood sugar, blood pressure and cholesterol control.     Latest Ref Rng & Units 05/17/2022   10:23 AM 05/07/2022  3:58 AM 05/06/2022    1:36 AM 05/05/2022    3:24 AM 05/04/2022    4:37 AM  Diabetic Labs  Creatinine 0.57 - 1.00 mg/dL 1.61  0.96  0.45  4.09  1.41       07/12/2022    9:56 AM 07/12/2022    9:29 AM 07/12/2022    9:28 AM 06/22/2022   12:40 PM 06/22/2022    8:32 AM 06/21/2022    8:32 AM 06/08/2022    8:26 AM  BP/Weight  Systolic BP 136 140 142 178 157 146 180  Diastolic BP 80 80 83 112 105 88 105  Wt. (Lbs)   222.12   219.2   BMI   36.96 kg/m2   36.48 kg/m2       Latest Ref Rng & Units 03/29/2021   12:00 AM 12/01/2020    8:00 AM  Foot/eye exam completion  dates  Eye Exam No Retinopathy No Retinopathy       Foot Form Completion   Done     This result is from an external source.        CHF (congestive heart failure) (HCC) Controlled, no s/s of decompensation

## 2022-07-13 ENCOUNTER — Encounter: Payer: Self-pay | Admitting: Family Medicine

## 2022-07-13 NOTE — Assessment & Plan Note (Signed)
Controlled, no change in medication DASH diet and commitment to daily physical activity for a minimum of 30 minutes discussed and encouraged, as a part of hypertension management. The importance of attaining a healthy weight is also discussed.     07/12/2022    9:56 AM 07/12/2022    9:29 AM 07/12/2022    9:28 AM 06/22/2022   12:40 PM 06/22/2022    8:32 AM 06/21/2022    8:32 AM 06/08/2022    8:26 AM  BP/Weight  Systolic BP 562 563 893 734 287 681 157  Diastolic BP 80 80 83 262 035 88 105  Wt. (Lbs)   222.12   219.2   BMI   36.96 kg/m2   36.48 kg/m2

## 2022-07-13 NOTE — Assessment & Plan Note (Signed)
Tracy Joseph is reminded of the importance of commitment to daily physical activity for 30 minutes or more, as able and the need to limit carbohydrate intake to 30 to 60 grams per meal to help with blood sugar control.   The need to take medication as prescribed, test blood sugar as directed, and to call between visits if there is a concern that blood sugar is uncontrolled is also discussed.   Tracy Joseph is reminded of the importance of daily foot exam, annual eye examination, and good blood sugar, blood pressure and cholesterol control.     Latest Ref Rng & Units 05/17/2022   10:23 AM 05/07/2022    3:58 AM 05/06/2022    1:36 AM 05/05/2022    3:24 AM 05/04/2022    4:37 AM  Diabetic Labs  Creatinine 0.57 - 1.00 mg/dL 1.44  1.57  1.52  1.71  1.41       07/12/2022    9:56 AM 07/12/2022    9:29 AM 07/12/2022    9:28 AM 06/22/2022   12:40 PM 06/22/2022    8:32 AM 06/21/2022    8:32 AM 06/08/2022    8:26 AM  BP/Weight  Systolic BP 361 443 154 008 676 195 093  Diastolic BP 80 80 83 267 124 88 105  Wt. (Lbs)   222.12   219.2   BMI   36.96 kg/m2   36.48 kg/m2       Latest Ref Rng & Units 03/29/2021   12:00 AM 12/01/2020    8:00 AM  Foot/eye exam completion dates  Eye Exam No Retinopathy No Retinopathy       Foot Form Completion   Done     This result is from an external source.

## 2022-07-13 NOTE — Assessment & Plan Note (Signed)
Controlled, no s/s of decompensation

## 2022-07-13 NOTE — Assessment & Plan Note (Signed)
Evaluated by Endo, now commiting to taking meds daily as prescribed, states had not been taking on empty stomach, "never knew"

## 2022-07-14 ENCOUNTER — Ambulatory Visit: Payer: Managed Care, Other (non HMO) | Admitting: Obstetrics & Gynecology

## 2022-07-19 NOTE — Patient Instructions (Signed)
Tracy Joseph  07/19/2022     '@PREFPERIOPPHARMACY'$ @   Your procedure is scheduled on  07/25/2022.   Report to [facility] at  0930  A.M.   Call this number if you have problems the morning of surgery:  [phone]   Remember:  Follow the diet and prep instructions given to you by the office.    Take these medicines the morning of surgery with A SIP OF WATER                 norvasc, imdur, normodyne, synthroid.     Do not wear jewelry, make-up or nail polish.  Do not wear lotions, powders, or perfumes, or deodorant.  Do not shave 48 hours prior to surgery.  Men may shave face and neck.  Do not bring valuables to the hospital.  [facility] is not responsible for any belongings or valuables.  Contacts, dentures or bridgework may not be worn into surgery.  Leave your suitcase in the car.  After surgery it may be brought to your room.  For patients admitted to the hospital, discharge time will be determined by your treatment team.  Patients discharged the day of surgery will not be allowed to drive home and must have someone with them for 24 hours.    Special instructions:   DO NOT smoke tobacco or vape for 24 hours before your procedure.  Please read over the following fact sheets that you were given. Anesthesia Post-op Instructions and Care and Recovery After Surgery      Upper Endoscopy, Adult, Care After This sheet gives you information about how to care for yourself after your procedure. Your health care provider may also give you more specific instructions. If you have problems or questions, contact your health care provider. What can I expect after the procedure? After the procedure, it is common to have: A sore throat. Mild stomach pain or discomfort. Bloating. Nausea. Follow these instructions at home:  Follow instructions from your health care provider about what to eat or drink after your procedure. Return to your normal activities as told by  your health care provider. Ask your health care provider what activities are safe for you. Take over-the-counter and prescription medicines only as told by your health care provider. If you were given a sedative during the procedure, it can affect you for several hours. Do not drive or operate machinery until your health care provider says that it is safe. Keep all follow-up visits as told by your health care provider. This is important. Contact a health care provider if you have: A sore throat that lasts longer than one day. Trouble swallowing. Get help right away if: You vomit blood or your vomit looks like coffee grounds. You have: A fever. Bloody, black, or tarry stools. A severe sore throat or you cannot swallow. Difficulty breathing. Severe pain in your chest or abdomen. Summary After the procedure, it is common to have a sore throat, mild stomach discomfort, bloating, and nausea. If you were given a sedative during the procedure, it can affect you for several hours. Do not drive or operate machinery until your health care provider says that it is safe. Follow instructions from your health care provider about what to eat or drink after your procedure. Return to your normal activities as told by your health care provider. This information is not intended to replace advice given to you by your health care provider. Make sure  you discuss any questions you have with your health care provider. Document Revised: 10/10/2019 Document Reviewed: 05/06/2018 Elsevier Patient Education  [place]. Colonoscopy, Adult, Care After The following information offers guidance on how to care for yourself after your procedure. Your health care provider may also give you more specific instructions. If you have problems or questions, contact your health care provider. What can I expect after the procedure? After the procedure, it is common to have: A small amount of blood in your stool for 24 hours  after the procedure. Some gas. Mild cramping or bloating of your abdomen. Follow these instructions at home: Eating and drinking  Drink enough fluid to keep your urine pale yellow. Follow instructions from your health care provider about eating or drinking restrictions. Resume your normal diet as told by your health care provider. Avoid heavy or fried foods that are hard to digest. Activity Rest as told by your health care provider. Avoid sitting for a long time without moving. Get up to take short walks every 1-2 hours. This is important to improve blood flow and breathing. Ask for help if you feel weak or unsteady. Return to your normal activities as told by your health care provider. Ask your health care provider what activities are safe for you. Managing cramping and bloating  Try walking around when you have cramps or feel bloated. If directed, apply heat to your abdomen as told by your health care provider. Use the heat source that your health care provider recommends, such as a moist heat pack or a heating pad. Place a towel between your skin and the heat source. Leave the heat on for 20-30 minutes. Remove the heat if your skin turns bright red. This is especially important if you are unable to feel pain, heat, or cold. You have a greater risk of getting burned. General instructions If you were given a sedative during the procedure, it can affect you for several hours. Do not drive or operate machinery until your health care provider says that it is safe. For the first 24 hours after the procedure: Do not sign important documents. Do not drink alcohol. Do your regular daily activities at a slower pace than normal. Eat soft foods that are easy to digest. Take over-the-counter and prescription medicines only as told by your health care provider. Keep all follow-up visits. This is important. Contact a health care provider if: You have blood in your stool 2-3 days after the  procedure. Get help right away if: You have more than a small spotting of blood in your stool. You have large blood clots in your stool. You have swelling of your abdomen. You have nausea or vomiting. You have a fever. You have increasing pain in your abdomen that is not relieved with medicine. These symptoms may be an emergency. Get help right away. Call 911. Do not wait to see if the symptoms will go away. Do not drive yourself to the hospital. Summary After the procedure, it is common to have a small amount of blood in your stool. You may also have mild cramping and bloating of your abdomen. If you were given a sedative during the procedure, it can affect you for several hours. Do not drive or operate machinery until your health care provider says that it is safe. Get help right away if you have a lot of blood in your stool, nausea or vomiting, a fever, or increased pain in your abdomen. This information is not intended to  replace advice given to you by your health care provider. Make sure you discuss any questions you have with your health care provider. Document Revised: 07/27/2021 Document Reviewed: 07/27/2021 Elsevier Patient Education  [place] After This sheet gives you information about how to care for yourself after your procedure. Your health care provider may also give you more specific instructions. If you have problems or questions, contact your health care provider. What can I expect after the procedure? After the procedure, it is common to have: Tiredness. Forgetfulness about what happened after the procedure. Impaired judgment for important decisions. Nausea or vomiting. Some difficulty with balance. Follow these instructions at home: For the time period you were told by your health care provider:     Rest as needed. Do not participate in activities where you could fall or become injured. Do not drive or use machinery. Do  not drink alcohol. Do not take sleeping pills or medicines that cause drowsiness. Do not make important decisions or sign legal documents. Do not take care of children on your own. Eating and drinking Follow the diet that is recommended by your health care provider. Drink enough fluid to keep your urine pale yellow. If you vomit: Drink water, juice, or soup when you can drink without vomiting. Make sure you have little or no nausea before eating solid foods. General instructions Have a responsible adult stay with you for the time you are told. It is important to have someone help care for you until you are awake and alert. Take over-the-counter and prescription medicines only as told by your health care provider. If you have sleep apnea, surgery and certain medicines can increase your risk for breathing problems. Follow instructions from your health care provider about wearing your sleep device: Anytime you are sleeping, including during daytime naps. While taking prescription pain medicines, sleeping medicines, or medicines that make you drowsy. Avoid smoking. Keep all follow-up visits as told by your health care provider. This is important. Contact a health care provider if: You keep feeling nauseous or you keep vomiting. You feel light-headed. You are still sleepy or having trouble with balance after 24 hours. You develop a rash. You have a fever. You have redness or swelling around the IV site. Get help right away if: You have trouble breathing. You have new-onset confusion at home. Summary For several hours after your procedure, you may feel tired. You may also be forgetful and have poor judgment. Have a responsible adult stay with you for the time you are told. It is important to have someone help care for you until you are awake and alert. Rest as told. Do not drive or operate machinery. Do not drink alcohol or take sleeping pills. Get help right away if you have trouble  breathing, or if you suddenly become confused. This information is not intended to replace advice given to you by your health care provider. Make sure you discuss any questions you have with your health care provider. Document Revised: 11/08/2021 Document Reviewed: 11/06/2019 Elsevier Patient Education  [place].

## 2022-07-20 ENCOUNTER — Encounter (HOSPITAL_COMMUNITY)
Admission: RE | Admit: 2022-07-20 | Discharge: 2022-07-20 | Disposition: A | Discharge status: Home / Self Care | Payer: Managed Care, Other (non HMO) | Source: Ambulatory Visit | Attending: Gastroenterology | Admitting: Gastroenterology

## 2022-07-20 ENCOUNTER — Encounter (HOSPITAL_COMMUNITY): Payer: Self-pay

## 2022-07-20 VITALS — HR 78 | Temp 97.6°F | Resp 18 | Ht 65.0 in | Wt 222.1 lb

## 2022-07-20 DIAGNOSIS — N1832 Chronic kidney disease, stage 3b: Secondary | ICD-10-CM

## 2022-07-20 DIAGNOSIS — E876 Hypokalemia: Secondary | ICD-10-CM | POA: Diagnosis not present

## 2022-07-20 DIAGNOSIS — Z01818 Encounter for other preprocedural examination: Secondary | ICD-10-CM

## 2022-07-20 DIAGNOSIS — D509 Iron deficiency anemia, unspecified: Secondary | ICD-10-CM

## 2022-07-20 DIAGNOSIS — D500 Iron deficiency anemia secondary to blood loss (chronic): Secondary | ICD-10-CM | POA: Diagnosis not present

## 2022-07-20 DIAGNOSIS — I10 Essential (primary) hypertension: Secondary | ICD-10-CM | POA: Insufficient documentation

## 2022-07-20 DIAGNOSIS — R7689 Other specified abnormal immunological findings in serum: Secondary | ICD-10-CM

## 2022-07-20 DIAGNOSIS — E119 Type 2 diabetes mellitus without complications: Secondary | ICD-10-CM

## 2022-07-20 DIAGNOSIS — R768 Other specified abnormal immunological findings in serum: Secondary | ICD-10-CM | POA: Diagnosis not present

## 2022-07-20 HISTORY — DX: Hypothyroidism, unspecified: E03.9

## 2022-07-20 LAB — CBC WITH DIFFERENTIAL/PLATELET
Abs Immature Granulocytes: 0.02 10*3/uL (ref 0.00–0.07)
Basophils Absolute: 0 10*3/uL (ref 0.0–0.1)
Basophils Relative: 0 %
Eosinophils Absolute: 0.1 10*3/uL (ref 0.0–0.5)
Eosinophils Relative: 1 %
HCT: 40.9 % (ref 36.0–46.0)
Hemoglobin: 13 g/dL (ref 12.0–15.0)
Immature Granulocytes: 0 %
Lymphocytes Relative: 15 %
Lymphs Abs: 1 10*3/uL (ref 0.7–4.0)
MCH: 29.3 pg (ref 26.0–34.0)
MCHC: 31.8 g/dL (ref 30.0–36.0)
MCV: 92.3 fL (ref 80.0–100.0)
Monocytes Absolute: 0.6 10*3/uL (ref 0.1–1.0)
Monocytes Relative: 9 %
Neutro Abs: 5 10*3/uL (ref 1.7–7.7)
Neutrophils Relative %: 75 %
Platelets: 269 10*3/uL (ref 150–400)
RBC: 4.43 MIL/uL (ref 3.87–5.11)
RDW: 23.6 % — ABNORMAL HIGH (ref 11.5–15.5)
WBC: 6.7 10*3/uL (ref 4.0–10.5)
nRBC: 0 % (ref 0.0–0.2)

## 2022-07-20 LAB — COMPREHENSIVE METABOLIC PANEL
ALT: 8 U/L (ref 0–44)
AST: 12 U/L — ABNORMAL LOW (ref 15–41)
Albumin: 4 g/dL (ref 3.5–5.0)
Alkaline Phosphatase: 65 U/L (ref 38–126)
Anion gap: 6 (ref 5–15)
BUN: 16 mg/dL (ref 6–20)
CO2: 23 mmol/L (ref 22–32)
Calcium: 9.1 mg/dL (ref 8.9–10.3)
Chloride: 108 mmol/L (ref 98–111)
Creatinine, Ser: 1.26 mg/dL — ABNORMAL HIGH (ref 0.44–1.00)
GFR, Estimated: 51 mL/min — ABNORMAL LOW (ref >60)
Glucose, Bld: 84 mg/dL (ref 70–99)
Potassium: 3.6 mmol/L (ref 3.5–5.1)
Sodium: 137 mmol/L (ref 135–145)
Total Bilirubin: 0.6 mg/dL (ref 0.3–1.2)
Total Protein: 7.9 g/dL (ref 6.5–8.1)

## 2022-07-20 LAB — PROTIME-INR
INR: 1.1 (ref 0.8–1.2)
Prothrombin Time: 14.3 seconds (ref 11.4–15.2)

## 2022-07-20 LAB — POCT PREGNANCY, URINE: Preg Test, Ur: NEGATIVE

## 2022-07-25 ENCOUNTER — Ambulatory Visit (HOSPITAL_BASED_OUTPATIENT_CLINIC_OR_DEPARTMENT_OTHER): Payer: Managed Care, Other (non HMO) | Admitting: Anesthesiology

## 2022-07-25 ENCOUNTER — Encounter (HOSPITAL_COMMUNITY)
Admission: RE | Disposition: A | Discharge status: Home / Self Care | Payer: Self-pay | Source: Home / Self Care | Attending: Gastroenterology

## 2022-07-25 ENCOUNTER — Ambulatory Visit (HOSPITAL_COMMUNITY): Payer: Managed Care, Other (non HMO) | Admitting: Anesthesiology

## 2022-07-25 ENCOUNTER — Encounter (HOSPITAL_COMMUNITY): Payer: Self-pay | Admitting: Gastroenterology

## 2022-07-25 ENCOUNTER — Ambulatory Visit (HOSPITAL_COMMUNITY)
Admission: RE | Admit: 2022-07-25 | Discharge: 2022-07-25 | Disposition: A | Discharge status: Home / Self Care | Payer: Managed Care, Other (non HMO) | Attending: Gastroenterology | Admitting: Gastroenterology

## 2022-07-25 DIAGNOSIS — K3189 Other diseases of stomach and duodenum: Secondary | ICD-10-CM | POA: Diagnosis not present

## 2022-07-25 DIAGNOSIS — E039 Hypothyroidism, unspecified: Secondary | ICD-10-CM | POA: Diagnosis not present

## 2022-07-25 DIAGNOSIS — K449 Diaphragmatic hernia without obstruction or gangrene: Secondary | ICD-10-CM

## 2022-07-25 DIAGNOSIS — K5730 Diverticulosis of large intestine without perforation or abscess without bleeding: Secondary | ICD-10-CM | POA: Insufficient documentation

## 2022-07-25 DIAGNOSIS — I130 Hypertensive heart and chronic kidney disease with heart failure and stage 1 through stage 4 chronic kidney disease, or unspecified chronic kidney disease: Secondary | ICD-10-CM | POA: Diagnosis not present

## 2022-07-25 DIAGNOSIS — D509 Iron deficiency anemia, unspecified: Secondary | ICD-10-CM | POA: Diagnosis not present

## 2022-07-25 DIAGNOSIS — K635 Polyp of colon: Secondary | ICD-10-CM | POA: Insufficient documentation

## 2022-07-25 DIAGNOSIS — E1122 Type 2 diabetes mellitus with diabetic chronic kidney disease: Secondary | ICD-10-CM | POA: Diagnosis not present

## 2022-07-25 DIAGNOSIS — B9681 Helicobacter pylori [H. pylori] as the cause of diseases classified elsewhere: Secondary | ICD-10-CM | POA: Diagnosis not present

## 2022-07-25 DIAGNOSIS — Z79899 Other long term (current) drug therapy: Secondary | ICD-10-CM | POA: Diagnosis not present

## 2022-07-25 DIAGNOSIS — N189 Chronic kidney disease, unspecified: Secondary | ICD-10-CM | POA: Diagnosis not present

## 2022-07-25 DIAGNOSIS — I5042 Chronic combined systolic (congestive) and diastolic (congestive) heart failure: Secondary | ICD-10-CM | POA: Diagnosis not present

## 2022-07-25 DIAGNOSIS — K2950 Unspecified chronic gastritis without bleeding: Secondary | ICD-10-CM | POA: Diagnosis not present

## 2022-07-25 DIAGNOSIS — D631 Anemia in chronic kidney disease: Secondary | ICD-10-CM | POA: Diagnosis not present

## 2022-07-25 DIAGNOSIS — G4733 Obstructive sleep apnea (adult) (pediatric): Secondary | ICD-10-CM | POA: Diagnosis not present

## 2022-07-25 HISTORY — PX: BIOPSY: SHX5522

## 2022-07-25 HISTORY — PX: COLONOSCOPY WITH PROPOFOL: SHX5780

## 2022-07-25 HISTORY — PX: ESOPHAGOGASTRODUODENOSCOPY (EGD) WITH PROPOFOL: SHX5813

## 2022-07-25 LAB — HM COLONOSCOPY

## 2022-07-25 SURGERY — COLONOSCOPY WITH PROPOFOL
Anesthesia: General

## 2022-07-25 MED ORDER — DEXMEDETOMIDINE (PRECEDEX) IN NS 20 MCG/5ML (4 MCG/ML) IV SYRINGE
PREFILLED_SYRINGE | INTRAVENOUS | Status: DC | PRN
  Administered 2022-07-25 (×3): 4 ug via INTRAVENOUS

## 2022-07-25 MED ORDER — PROPOFOL 500 MG/50ML IV EMUL
INTRAVENOUS | Status: DC | PRN
  Administered 2022-07-25: 175 ug/kg/min via INTRAVENOUS

## 2022-07-25 MED ORDER — LACTATED RINGERS IV SOLN: INTRAVENOUS | Status: DC

## 2022-07-25 MED ORDER — GLYCOPYRROLATE 0.2 MG/ML IJ SOLN
INTRAMUSCULAR | Status: DC | PRN
  Administered 2022-07-25: .1 mg via INTRAVENOUS

## 2022-07-25 MED ORDER — KETAMINE HCL 10 MG/ML IJ SOLN
INTRAMUSCULAR | Status: DC | PRN
  Administered 2022-07-25: 5 mg via INTRAVENOUS
  Administered 2022-07-25 (×2): 10 mg via INTRAVENOUS

## 2022-07-25 MED ORDER — KETAMINE HCL 50 MG/5ML IJ SOSY
PREFILLED_SYRINGE | INTRAMUSCULAR | Status: AC
  Filled 2022-07-25: qty 5

## 2022-07-25 MED ORDER — ONDANSETRON HCL 4 MG/2ML IJ SOLN
INTRAMUSCULAR | Status: DC | PRN
  Administered 2022-07-25: 4 mg via INTRAVENOUS

## 2022-07-25 MED ORDER — ONDANSETRON HCL 4 MG/2ML IJ SOLN
INTRAMUSCULAR | Status: AC
  Filled 2022-07-25: qty 2

## 2022-07-25 MED ORDER — PROPOFOL 10 MG/ML IV BOLUS
INTRAVENOUS | Status: DC | PRN
  Administered 2022-07-25 (×4): 20 mg via INTRAVENOUS
  Administered 2022-07-25: 10 mg via INTRAVENOUS

## 2022-07-25 NOTE — Discharge Instructions (Addendum)
You are being discharged home Resume your previous diet We are waiting for your pathology results Your physician has recommended a repeat colonoscopy - timeframe to be determined once pathology has been reviewed. Continue oral iron Consider capsule endoscopy depending on results of pathology

## 2022-07-25 NOTE — Op Note (Signed)
[facility] Patient Name: Tracy Joseph Procedure Date: 07/25/2022 10:59 AM MRN: [id number] Date of Birth: 06-25-1971 Attending MD: [name] ,  CSN: [id number] Age: 51 Admit Type: Outpatient Procedure:                Upper GI endoscopy Indications:              Iron deficiency anemia Providers:                [name], Janeece Riggers, RN, Raphael Gibney,                            Technician Referring MD:              Medicines:                Monitored Anesthesia Care Complications:            No immediate complications. Estimated Blood Loss:     Estimated blood loss: none. Procedure:                Pre-Anesthesia Assessment:                           - Prior to the procedure, a History and Physical                            was performed, and patient medications, allergies                            and sensitivities were reviewed. The patient's                            tolerance of previous anesthesia was reviewed.                           - The risks and benefits of the procedure and the                            sedation options and risks were discussed with the                            patient. All questions were answered and informed                            consent was obtained.                           - ASA Grade Assessment: II - A patient with mild                            systemic disease.                           After obtaining informed consent, the endoscope was                            passed under direct vision. Throughout the  procedure, the patient's blood pressure, pulse, and                            oxygen saturations were monitored continuously. The                            [id number]-H190 ([id number]) scope was introduced through the                            mouth, and advanced to the second part of duodenum.                            The upper GI endoscopy was accomplished without                             difficulty. The patient tolerated the procedure                            well. Scope In: 11:10:39 AM Scope Out: 11:18:05 AM Total Procedure Duration: 0 hours 7 minutes 26 seconds  Findings:      A 2 cm hiatal hernia was present.      Diffuse mildly erythematous mucosa without bleeding was found in the       entire examined stomach. Biopsies were taken with a cold forceps for       Helicobacter pylori testing.      The examined duodenum was normal. Biopsies were taken with a cold       forceps for histology. Impression:               - 2 cm hiatal hernia.                           - Erythematous mucosa in the stomach. Biopsied.                           - Normal examined duodenum. Biopsied. Moderate Sedation:      Per Anesthesia Care Recommendation:           - Discharge patient to home (ambulatory).                           - Resume previous diet.                           - Await pathology results.                           - Continue oral iron intake. Procedure Code(s):        --- Professional ---                           [id number], Esophagogastroduodenoscopy, flexible,                            transoral; with biopsy, single or multiple Diagnosis Code(s):        --- Professional ---  K44.9, Diaphragmatic hernia without obstruction or                            gangrene                           K31.89, Other diseases of stomach and duodenum                           D50.9, Iron deficiency anemia, unspecified CPT copyright 2019 [employer]. All rights reserved. The codes documented in this report are preliminary and upon coder review may  be revised to meet current compliance requirements. [name], MD [name],  07/25/2022 11:22:21 AM This report has been signed electronically. Number of Addenda: 0

## 2022-07-25 NOTE — Op Note (Signed)
[facility] Patient Name: Tracy Joseph Procedure Date: 07/25/2022 10:58 AM MRN: [id number] Date of Birth: [birth year] Attending MD: [name] ,  CSN: [id number] Age: 51 Admit Type: Outpatient Procedure:                Colonoscopy Indications:              Iron deficiency anemia Providers:                [name], Janeece Riggers, RN, Raphael Gibney,                            Technician Referring MD:              Medicines:                Monitored Anesthesia Care Complications:            No immediate complications. Estimated Blood Loss:     Estimated blood loss: none. Procedure:                Pre-Anesthesia Assessment:                           - Prior to the procedure, a History and Physical                            was performed, and patient medications, allergies                            and sensitivities were reviewed. The patient's                            tolerance of previous anesthesia was reviewed.                           - The risks and benefits of the procedure and the                            sedation options and risks were discussed with the                            patient. All questions were answered and informed                            consent was obtained.                           - ASA Grade Assessment: II - A patient with mild                            systemic disease.                           After obtaining informed consent, the colonoscope                            was passed under direct vision. Throughout the  procedure, the patient's blood pressure, pulse, and                            oxygen saturations were monitored continuously. The                            [id number]-HQ190L ([id number]) scope was introduced through                            the anus and advanced to the the cecum, identified                            by appendiceal orifice and ileocecal valve. The                             colonoscopy was somewhat difficult due to constant                            retching in last third of withdrawal. The patient                            tolerated the procedure well. The quality of the                            bowel preparation was adequate. Scope In: 11:23:52 AM Scope Out: 11:41:51 AM Scope Withdrawal Time: 0 hours 15 minutes 12 seconds  Total Procedure Duration: 0 hours 17 minutes 59 seconds  Findings:      The perianal and digital rectal examinations were normal.      A 2 mm polyp was found in the cecum. The polyp was sessile. The polyp       was removed with a cold biopsy forceps. Resection and retrieval were       complete.      A few small-mouthed diverticula were found in the sigmoid colon and       descending colon.      Note: no rectal retroflexion could be performed as the patient had       recurrent retching and peristalsis in the last third of the withdrawal. Impression:               - One 2 mm polyp in the cecum, removed with a cold                            biopsy forceps. Resected and retrieved.                           - Diverticulosis in the sigmoid colon and in the                            descending colon. Moderate Sedation:      Per Anesthesia Care Recommendation:           - Discharge patient to home (ambulatory).                           - Resume  previous diet.                           - Await pathology results.                           - Repeat colonoscopy for surveillance based on                            pathology results.                           - Consider capsule endoscopy depending on results                            of pathology. Procedure Code(s):        --- Professional ---                           [phone], Colonoscopy, flexible; with biopsy, single                            or multiple Diagnosis Code(s):        --- Professional ---                           K63.5, Polyp of colon                           D50.9, Iron  deficiency anemia, unspecified                           K57.30, Diverticulosis of large intestine without                            perforation or abscess without bleeding CPT copyright 2019 [employer] Association. All rights reserved. The codes documented in this report are preliminary and upon coder review may  be revised to meet current compliance requirements. [name], MD [name],  07/25/2022 11:50:25 AM This report has been signed electronically. Number of Addenda: 0

## 2022-07-25 NOTE — Anesthesia Postprocedure Evaluation (Signed)
Anesthesia Post Note  Patient: Tracy Joseph  Procedure(s) Performed: COLONOSCOPY WITH PROPOFOL ESOPHAGOGASTRODUODENOSCOPY (EGD) WITH PROPOFOL BIOPSY  Patient location during evaluation: Phase II Anesthesia Type: General Level of consciousness: awake and alert and oriented Pain management: pain level controlled Vital Signs Assessment: post-procedure vital signs reviewed and stable Respiratory status: spontaneous breathing, nonlabored ventilation and respiratory function stable Cardiovascular status: blood pressure returned to baseline and stable Postop Assessment: no apparent nausea or vomiting Anesthetic complications: no   No notable events documented.   Last Vitals:  Vitals:   07/25/22 1012 07/25/22 1149  BP: (!) 143/88 (!) 110/57  Pulse: 80 97  Resp: 20 17  Temp: 36.6 C 36.8 C  SpO2: 97% 98%    Last Pain:  Vitals:   07/25/22 1149  TempSrc: Oral  PainSc: 0-No pain                 Decorey Wahlert C Shateria Paternostro

## 2022-07-25 NOTE — Transfer of Care (Signed)
Immediate Anesthesia Transfer of Care Note  Patient: Tracy Joseph  Procedure(s) Performed: COLONOSCOPY WITH PROPOFOL ESOPHAGOGASTRODUODENOSCOPY (EGD) WITH PROPOFOL BIOPSY  Patient Location: PACU  Anesthesia Type:MAC  Level of Consciousness: drowsy and patient cooperative  Airway & Oxygen Therapy: Patient Spontanous Breathing  Post-op Assessment: Report given to RN and Post -op Vital signs reviewed and stable  Post vital signs: Reviewed and stable  Last Vitals:  Vitals Value Taken Time  BP 110/57 07/25/22 1149  Temp 36.8 C 07/25/22 1149  Pulse 97 07/25/22 1149  Resp 17 07/25/22 1149  SpO2 98 % 07/25/22 1149    Last Pain:  Vitals:   07/25/22 1149  TempSrc: Oral  PainSc: 0-No pain      Patients Stated Pain Goal: 7 (62/22/97 [id number])  Complications: No notable events documented.

## 2022-07-25 NOTE — H&P (Signed)
Tracy Joseph is an 51 y.o. female.   Chief Complaint: Iron deficiency anemia HPI: Tracy Joseph is a 51 y.o. 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  Patient denies having any complaints.  Has been taking iron compliantly.  Denies any nausea, vomiting, fever, chills, melena or hematochezia.  Not taking any blood thinner.  Past Medical History:  Diagnosis Date   Essential hypertension    Goiter    Initially hyperthyroid treated with RAI, now hypothyroid - follows with Dr. [name]   Hypertension    [employer] 10/12/2020   Hypothyroidism    Obstructive sleep apnea    Suspected    Prediabetes    Seasonal allergies    Tubular adenoma of colon     Past Surgical History:  Procedure Laterality Date   COLONOSCOPY N/A 05/03/2017   Procedure: COLONOSCOPY;  Surgeon: Rogene Houston, MD;  Location: [facility];  Service: Endoscopy;  Laterality: N/A;  47   TUBAL LIGATION  2005    Family History  Problem Relation Age of Onset   Hypertension Mother    Hyperlipidemia Mother    Thyroid disease Paternal Grandmother    Coronary artery disease Maternal Aunt    Coronary artery disease Maternal Aunt    Social History:  reports that she has never smoked. She has never used smokeless tobacco. She reports that she does not drink alcohol and does not use drugs.  Allergies:  Allergies  Allergen Reactions   Ace Inhibitors Cough   Dust Mite Extract    Latex Itching   Pollen Extract     Medications Prior to Admission  Medication Sig Dispense Refill   amLODipine (NORVASC) 10 MG tablet Take 1 tablet (10 mg total) by mouth daily. 90 tablet 3   cloNIDine (CATAPRES - DOSED IN MG/24 HR) 0.2 mg/24hr patch Place 1 patch (0.2 mg total) onto the skin once a week. Change every Friday 4 patch 12   ferrous sulfate 325 (65 FE) MG EC tablet Take 1 tablet (325 mg total) by mouth daily with breakfast. 90 tablet 3    furosemide (LASIX) 40 MG tablet Take 1 tablet (40 mg total) by mouth daily. 90 tablet 3   hydrALAZINE (APRESOLINE) 100 MG tablet Take 1 tablet (100 mg total) by mouth 3 (three) times daily. 270 tablet 2   isosorbide mononitrate (IMDUR) 60 MG 24 hr tablet Take 1 tablet (60 mg total) by mouth daily. 90 tablet 3   labetalol (NORMODYNE) 300 MG tablet Take 1 tablet (300 mg total) by mouth 2 (two) times daily. For BP 180 tablet 3   levothyroxine (SYNTHROID) 175 MCG tablet Take 1 tablet (175 mcg total) by mouth daily before breakfast. 90 tablet 1   Multiple Vitamin (MULTIVITAMIN ADULT PO) Take 1 tablet by mouth daily.     norethindrone (AYGESTIN) 5 MG tablet Take 1 tablet (5 mg total) by mouth daily. 90 tablet 4   polyethylene glycol-electrolytes (TRILYTE) 420 g solution Take 4,000 mLs by mouth as directed. 4000 mL 0   vitamin B-12 (CYANOCOBALAMIN) 1000 MCG tablet Take 1 tablet (1,000 mcg total) by mouth daily. 30 tablet 3   Vitamin D, Ergocalciferol, (DRISDOL) 1.25 MG (50000 UNIT) CAPS capsule Take 50,000 Units by mouth every Wednesday.      No results found for this or any previous visit (from the past 48 hour(s)). No results found.  Review of Systems  All other systems reviewed and  are negative.   Blood pressure (!) 143/88, pulse 80, temperature 97.8 F (36.6 C), temperature source Oral, resp. rate 20, height '5\' 5"'$  (1.651 m), weight 100.8 kg, last menstrual period 06/07/2022, SpO2 97 %. Physical Exam  GENERAL: The patient is AO x3, in no acute distress. HEENT: Head is normocephalic and atraumatic. EOMI are intact. Mouth is well hydrated and without lesions. NECK: Supple. No masses LUNGS: Clear to auscultation. No presence of rhonchi/wheezing/rales. Adequate chest expansion HEART: RRR, normal s1 and s2. ABDOMEN: Soft, nontender, no guarding, no peritoneal signs, and nondistended. BS +. No masses. EXTREMITIES: Without any cyanosis, clubbing, rash, lesions or edema. NEUROLOGIC: AOx3, no  focal motor deficit. SKIN: no jaundice, no rashes  Assessment/Plan  Tracy Joseph is a 51 y.o. female with hypothyroidism, CKD, systolic/diastolic CHF EF 62-26%, , OSA, HTN, who presents for evaluation of iron deficiency anemia.  We will proceed with EGD and colonoscopy.  Harvel Quale, MD 07/25/2022, 10:26 AM

## 2022-07-25 NOTE — Anesthesia Preprocedure Evaluation (Addendum)
Anesthesia Evaluation  Patient identified by MRN, date of birth, ID band Patient awake    Reviewed: Allergy & Precautions, NPO status , Patient's Chart, lab work & pertinent test results  History of Anesthesia Complications Negative for: history of anesthetic complications  Airway Mallampati: III  TM Distance: >3 FB Neck ROM: Full    Dental  (+) Dental Advisory Given, Missing   Pulmonary sleep apnea ,    Pulmonary exam normal breath sounds clear to auscultation       Cardiovascular Exercise Tolerance: Good hypertension, Pt. on medications and Pt. on home beta blockers +CHF   Rhythm:Regular Rate:Normal + Systolic murmurs 1. Left ventricular ejection fraction, by estimation, is 45 to 50%. The left ventricle has mildly decreased function. The left ventricle has no regional wall motion abnormalities. The left ventricular internal cavity  size was severely dilated. There is severe left ventricular hypertrophy. Left ventricular diastolic parameters  are consistent with Grade II diastolic dysfunction (pseudonormalization).  Elevated left atrial pressure.  2. Right ventricular systolic function is normal. The right ventricular size is normal.  3. Left atrial size was severely dilated.  4. Right atrial size was mildly dilated.  5. The mitral valve is normal in structure. Mild mitral valve  regurgitation.  6. The aortic valve is tricuspid. Aortic valve regurgitation is not  visualized. Aortic valve sclerosis is present, with no evidence of aortic valve stenosis.  7. The inferior vena cava is normal in size with greater than 50% respiratory variability, suggesting right atrial pressure of 3 mmHg.    Neuro/Psych negative neurological ROS  negative psych ROS   GI/Hepatic negative GI ROS, Neg liver ROS,   Endo/Other  diabetes, Well Controlled, Type 2Hypothyroidism   Renal/GU Renal InsufficiencyRenal disease  negative  genitourinary   Musculoskeletal negative musculoskeletal ROS (+)   Abdominal   Peds negative pediatric ROS (+)  Hematology  (+) Blood dyscrasia, anemia ,   Anesthesia Other Findings Sinus rhythm Left ventricular hypertrophy Anterior Q waves, possibly due to LVH No significant change since 10/12/2020 Confirmed by Veryl Speak [phone]) on 05/04/2022 4:43:35 AM  Reproductive/Obstetrics negative OB ROS                            Anesthesia Physical Anesthesia Plan  ASA: 3  Anesthesia Plan: General   Post-op Pain Management: Minimal or no pain anticipated   Induction: Intravenous  PONV Risk Score and Plan: Propofol infusion  Airway Management Planned: Nasal Cannula and Natural Airway  Additional Equipment:   Intra-op Plan:   Post-operative Plan:   Informed Consent: I have reviewed the patients History and Physical, chart, labs and discussed the procedure including the risks, benefits and alternatives for the proposed anesthesia with the patient or authorized representative who has indicated his/her understanding and acceptance.     Dental advisory given  Plan Discussed with: CRNA and Surgeon  Anesthesia Plan Comments:         Anesthesia Quick Evaluation

## 2022-07-26 ENCOUNTER — Encounter (INDEPENDENT_AMBULATORY_CARE_PROVIDER_SITE_OTHER): Payer: Self-pay | Admitting: *Deleted

## 2022-07-26 ENCOUNTER — Other Ambulatory Visit (INDEPENDENT_AMBULATORY_CARE_PROVIDER_SITE_OTHER): Payer: Self-pay | Admitting: Gastroenterology

## 2022-07-26 DIAGNOSIS — B9681 Helicobacter pylori [H. pylori] as the cause of diseases classified elsewhere: Secondary | ICD-10-CM

## 2022-07-26 LAB — SURGICAL PATHOLOGY

## 2022-07-26 MED ORDER — OMEPRAZOLE 40 MG PO CPDR: 40.0000 mg | DELAYED_RELEASE_CAPSULE | Freq: Two times a day (BID) | ORAL | 0 refills | Status: DC

## 2022-07-26 MED ORDER — BISMUTH 262 MG PO CHEW: 2.0000 | CHEWABLE_TABLET | Freq: Four times a day (QID) | ORAL | 0 refills | Status: AC

## 2022-07-26 MED ORDER — TETRACYCLINE HCL 500 MG PO CAPS: 500.0000 mg | ORAL_CAPSULE | Freq: Four times a day (QID) | ORAL | 0 refills | Status: AC

## 2022-07-26 MED ORDER — METRONIDAZOLE 500 MG PO TABS: 500.0000 mg | ORAL_TABLET | Freq: Three times a day (TID) | ORAL | 0 refills | Status: AC

## 2022-07-27 ENCOUNTER — Other Ambulatory Visit (INDEPENDENT_AMBULATORY_CARE_PROVIDER_SITE_OTHER): Payer: Self-pay | Admitting: *Deleted

## 2022-07-27 DIAGNOSIS — B9681 Helicobacter pylori [H. pylori] as the cause of diseases classified elsewhere: Secondary | ICD-10-CM

## 2022-07-28 ENCOUNTER — Encounter (HOSPITAL_COMMUNITY): Payer: Self-pay | Admitting: Gastroenterology

## 2022-08-17 ENCOUNTER — Ambulatory Visit: Payer: Managed Care, Other (non HMO) | Admitting: Family Medicine

## 2022-08-24 ENCOUNTER — Ambulatory Visit: Payer: Managed Care, Other (non HMO) | Admitting: Obstetrics & Gynecology

## 2022-08-31 ENCOUNTER — Inpatient Hospital Stay: Payer: Managed Care, Other (non HMO) | Attending: Hematology

## 2022-08-31 DIAGNOSIS — E538 Deficiency of other specified B group vitamins: Secondary | ICD-10-CM | POA: Insufficient documentation

## 2022-08-31 DIAGNOSIS — Z79899 Other long term (current) drug therapy: Secondary | ICD-10-CM | POA: Insufficient documentation

## 2022-08-31 DIAGNOSIS — D509 Iron deficiency anemia, unspecified: Secondary | ICD-10-CM | POA: Diagnosis present

## 2022-08-31 LAB — CBC WITH DIFFERENTIAL/PLATELET
Abs Immature Granulocytes: 0.02 10*3/uL (ref 0.00–0.07)
Basophils Absolute: 0 10*3/uL (ref 0.0–0.1)
Basophils Relative: 0 %
Eosinophils Absolute: 0.1 10*3/uL (ref 0.0–0.5)
Eosinophils Relative: 2 %
HCT: 43.7 % (ref 36.0–46.0)
Hemoglobin: 14.4 g/dL (ref 12.0–15.0)
Immature Granulocytes: 0 %
Lymphocytes Relative: 19 %
Lymphs Abs: 1.5 10*3/uL (ref 0.7–4.0)
MCH: 31 pg (ref 26.0–34.0)
MCHC: 33 g/dL (ref 30.0–36.0)
MCV: 94.2 fL (ref 80.0–100.0)
Monocytes Absolute: 0.8 10*3/uL (ref 0.1–1.0)
Monocytes Relative: 10 %
Neutro Abs: 5.4 10*3/uL (ref 1.7–7.7)
Neutrophils Relative %: 69 %
Platelets: 251 10*3/uL (ref 150–400)
RBC: 4.64 MIL/uL (ref 3.87–5.11)
RDW: 15.5 % (ref 11.5–15.5)
WBC: 7.8 10*3/uL (ref 4.0–10.5)
nRBC: 0 % (ref 0.0–0.2)

## 2022-08-31 LAB — IRON AND TIBC
Iron: 52 ug/dL (ref 28–170)
Saturation Ratios: 14 % (ref 10.4–31.8)
TIBC: 376 ug/dL (ref 250–450)
UIBC: 324 ug/dL

## 2022-08-31 LAB — FERRITIN: Ferritin: 16 ng/mL (ref 11–307)

## 2022-09-06 NOTE — Progress Notes (Unsigned)
Tracy Joseph, [place] [address]   CLINIC:  Medical Oncology/Hematology  PCP:  Tracy Helper, MD [address] [place] [address] [phone]   REASON FOR VISIT:  Follow-up for iron deficiency anemia  CURRENT THERAPY: IV iron  INTERVAL HISTORY:  Tracy Joseph 51 y.o. female returns for routine follow-up of her iron deficiency anemia.  She was seen for initial consultation by Tracy Joseph on 05/26/2022.   At today's visit, she reports feeling well.  No recent hospitalizations, surgeries, or changes in baseline health status.  She received IV Venofer x1000 mg from 05/29/2022 through 06/22/2022.  After receiving IV iron, she reports improved energy.  She reports that her periods are irregular, and thinks that she is perimenopausal.  Periods have been heavy the last several months, but she feels that they are "starting to fade out."  She is taking vitamin B12, iron tablet, and multivitamin at home.  She has 100% energy and 100% appetite. She endorses that she is maintaining a stable weight.   REVIEW OF SYSTEMS:  Review of Systems  Constitutional:  Negative for appetite change, chills, diaphoresis, fatigue, fever and unexpected weight change.  HENT:   Negative for lump/mass and nosebleeds.   Eyes:  Negative for eye problems.  Respiratory:  Negative for cough, hemoptysis and shortness of breath.   Cardiovascular:  Negative for chest pain, leg swelling and palpitations.  Gastrointestinal:  Negative for abdominal pain, blood in stool, constipation, diarrhea, nausea and vomiting.  Genitourinary:  Negative for hematuria.   Skin: Negative.   Neurological:  Negative for dizziness, headaches and light-headedness.  Hematological:  Does not bruise/bleed easily.      PAST MEDICAL/SURGICAL HISTORY:  Past Medical History:  Diagnosis Date   Essential hypertension    Goiter    Initially hyperthyroid treated with RAI, now hypothyroid -  follows with Tracy Joseph   Hypertension    Phreesia 10/12/2020   Hypothyroidism    Obstructive sleep apnea    Suspected    Prediabetes    Seasonal allergies    Tubular adenoma of colon    Past Surgical History:  Procedure Laterality Date   BIOPSY  07/25/2022   Procedure: BIOPSY;  Surgeon: Tracy Quale, MD;  [facility];  Service: Gastroenterology;;   COLONOSCOPY N/A 05/03/2017   Procedure: COLONOSCOPY;  Surgeon: Tracy Houston, MD;  [facility]: [facility];  Service: Endoscopy;  Laterality: N/A;  930   COLONOSCOPY WITH PROPOFOL N/A 07/25/2022   Procedure: COLONOSCOPY WITH PROPOFOL;  Surgeon: Tracy Quale, MD;  [facility]: [facility];  Service: Gastroenterology;  Laterality: N/A;  1130 am   ESOPHAGOGASTRODUODENOSCOPY (EGD) WITH PROPOFOL N/A 07/25/2022   Procedure: ESOPHAGOGASTRODUODENOSCOPY (EGD) WITH PROPOFOL;  Surgeon: Tracy Quale, MD;  [facility]: [facility];  Service: Gastroenterology;  Laterality: N/A;   TUBAL LIGATION  2005     SOCIAL HISTORY:  Social History   Socioeconomic History   Marital status: Married    Spouse name: Not on file   Number of children: 1   Years of education: Not on file   Highest education level: Not on file  Occupational History   Occupation: full time student for [employer]  Tobacco Use   Smoking status: Never   Smokeless tobacco: Never  Vaping Use   Vaping Use: Never used  Substance and Sexual Activity   Alcohol use: No   Drug use: No   Sexual activity: Yes  Birth control/protection: None  Other Topics Concern   Not on file  Social History Narrative   Not on file   Social Determinants of Health   Financial Resource Strain: Low Risk  (03/29/2022)   Overall Financial Resource Strain (CARDIA)    Difficulty of Paying Living Expenses: Not hard at all  Food Insecurity: No Food Insecurity (03/29/2022)   Hunger Vital Sign    Worried About Running Out of Food in the Last Year: Never true    Ran  Out of Food in the Last Year: Never true  Transportation Needs: No Transportation Needs (03/29/2022)   PRAPARE - Hydrologist (Medical): No    Lack of Transportation (Non-Medical): No  Physical Activity: Sufficiently Active (03/29/2022)   Exercise Vital Sign    Days of Exercise per Week: 3 days    Minutes of Exercise per Session: 60 min  Stress: No Stress Concern Present (03/29/2022)   [place]    Feeling of Stress : Not at all  Social Connections: [place] (03/29/2022)   Social Connection and Isolation Panel [NHANES]    Frequency of Communication with Friends and Family: Twice a week    Frequency of Social Gatherings with Friends and Family: Twice a week    Attends Religious Services: More than 4 times per year    Active Member of [employer] or Organizations: Yes    Attends Archivist Meetings: 1 to 4 times per year    Marital Status: Married  Human resources officer Violence: Not At Risk (03/29/2022)   Humiliation, Afraid, Rape, and Kick questionnaire    Fear of Current or Ex-Partner: No    Emotionally Abused: No    Physically Abused: No    Sexually Abused: No    FAMILY HISTORY:  Family History  Problem Relation Age of Onset   Hypertension Mother    Hyperlipidemia Mother    Thyroid disease Paternal Grandmother    Coronary artery disease Maternal Aunt    Coronary artery disease Maternal Aunt     CURRENT MEDICATIONS:  Outpatient Encounter Medications as of 09/07/2022  Medication Sig   amLODipine (NORVASC) 10 MG tablet Take 1 tablet (10 mg total) by mouth daily.   cloNIDine (CATAPRES - DOSED IN MG/24 HR) 0.2 mg/24hr patch Place 1 patch (0.2 mg total) onto the skin once a week. Change every Friday   ferrous sulfate 325 (65 FE) MG EC tablet Take 1 tablet (325 mg total) by mouth daily with breakfast.   furosemide (LASIX) 40 MG tablet Take 1 tablet (40 mg total) by mouth daily.    hydrALAZINE (APRESOLINE) 100 MG tablet Take 1 tablet (100 mg total) by mouth 3 (three) times daily.   isosorbide mononitrate (IMDUR) 60 MG 24 hr tablet Take 1 tablet (60 mg total) by mouth daily.   labetalol (NORMODYNE) 300 MG tablet Take 1 tablet (300 mg total) by mouth 2 (two) times daily. For BP   levothyroxine (SYNTHROID) 175 MCG tablet Take 1 tablet (175 mcg total) by mouth daily before breakfast.   Multiple Vitamin (MULTIVITAMIN ADULT PO) Take 1 tablet by mouth daily.   norethindrone (AYGESTIN) 5 MG tablet Take 1 tablet (5 mg total) by mouth daily.   omeprazole (PRILOSEC) 40 MG capsule Take 1 capsule (40 mg total) by mouth 2 (two) times daily for 14 days.   vitamin B-12 (CYANOCOBALAMIN) 1000 MCG tablet Take 1 tablet (1,000 mcg total) by mouth daily.   Vitamin  D, Ergocalciferol, (DRISDOL) 1.25 MG (50000 UNIT) CAPS capsule Take 50,000 Units by mouth every Wednesday.   No facility-administered encounter medications on file as of 09/07/2022.    ALLERGIES:  Allergies  Allergen Reactions   Ace Inhibitors Cough   Dust Mite Extract    Latex Itching   Pollen Extract      PHYSICAL EXAM:  ECOG PERFORMANCE STATUS: 0 - Asymptomatic  There were no vitals filed for this visit. There were no vitals filed for this visit. Physical Exam Constitutional:      Appearance: Normal appearance. She is obese.  HENT:     Head: Normocephalic and atraumatic.     Mouth/Throat:     Mouth: Mucous membranes are moist.  Eyes:     Extraocular Movements: Extraocular movements intact.     Pupils: Pupils are equal, round, and reactive to light.  Cardiovascular:     Rate and Rhythm: Normal rate and regular rhythm.     Pulses: Normal pulses.     Heart sounds: Normal heart sounds.  Pulmonary:     Effort: Pulmonary effort is normal.     Breath sounds: Normal breath sounds.  Abdominal:     General: Bowel sounds are normal.     Palpations: Abdomen is soft.     Tenderness: There is no abdominal tenderness.   Musculoskeletal:        General: No swelling.     Right lower leg: No edema.     Left lower leg: No edema.  Lymphadenopathy:     Cervical: No cervical adenopathy.  Skin:    General: Skin is warm and dry.  Neurological:     General: No focal deficit present.     Mental Status: She is alert and oriented to person, place, and time.  Psychiatric:        Mood and Affect: Mood normal.        Behavior: Behavior normal.      LABORATORY DATA:  I have reviewed the labs as listed.  CBC    Component Value Date/Time   WBC 7.8 08/31/2022 1100   RBC 4.64 08/31/2022 1100   HGB 14.4 08/31/2022 1100   HGB 9.7 (L) 03/08/2022 0930   HCT 43.7 08/31/2022 1100   HCT 32.0 (L) 03/08/2022 0930   PLT 251 08/31/2022 1100   PLT 339 03/08/2022 0930   MCV 94.2 08/31/2022 1100   MCV 77 (L) 03/08/2022 0930   MCH 31.0 08/31/2022 1100   MCHC 33.0 08/31/2022 1100   RDW 15.5 08/31/2022 1100   RDW 15.8 (H) 03/08/2022 0930   LYMPHSABS 1.5 08/31/2022 1100   LYMPHSABS 1.4 04/26/2021 0937   MONOABS 0.8 08/31/2022 1100   EOSABS 0.1 08/31/2022 1100   EOSABS 0.1 04/26/2021 0937   BASOSABS 0.0 08/31/2022 1100   BASOSABS 0.0 04/26/2021 0937      Latest Ref Rng & Units 07/20/2022   11:44 AM 05/17/2022   10:23 AM 05/07/2022    3:58 AM  CMP  Glucose 70 - 99 mg/dL 84  99  115   BUN 6 - 20 mg/dL '16  17  20   '$ Creatinine 0.44 - 1.00 mg/dL 1.26  1.44  1.57   Sodium 135 - 145 mmol/L 137  141  137   Potassium 3.5 - 5.1 mmol/L 3.6  4.0  3.2   Chloride 98 - 111 mmol/L 108  101  104   CO2 22 - 32 mmol/L '23  22  26   '$ Calcium 8.9 - 10.3  mg/dL 9.1  9.5  8.9   Total Protein 6.5 - 8.1 g/dL 7.9     Total Bilirubin 0.3 - 1.2 mg/dL 0.6     Alkaline Phos 38 - 126 U/L 65     AST 15 - 41 U/L 12     ALT 0 - 44 U/L 8       DIAGNOSTIC IMAGING:  I have independently reviewed the relevant imaging and discussed with the patient.  ASSESSMENT & PLAN: 1.  Iron deficiency anemia - Seen at the request of Dr. Moshe Cipro for severe  microcytic anemia from iron deficiency and CKD stage IIIa/b - EGD/colonoscopy (07/25/2022): Erythematous mucosa in stomach, polyps and diverticulosis in colon.  No obvious source of bleeding. - Labs from 05/04/2022 showed severely low ferritin 3 with Hgb 8.3 - Additional hematology work-up (05/26/2022) was unremarkable: CMP with baseline CKD stage IIIa/b (creatinine 1.26/GFR 51) Low reticulocytes 1.6%.  Normal LDH. Normal B12, MMA, folate, copper  Elevated kappa light chains 43.2, normal lambda, elevated ratio 2.12.  Normal SPEP. - She has had heavy menses every 14 to 18 days for the last several years, but they are starting to become more irregular and lighter over the last few months. - Denies any bright blood per rectum or melena.   - She has been taking iron tablet + multivitamins since May 2023 - She received IV Venofer x3 in June/July 2023 - Most recent labs (08/31/2022): Normal CBC with Hgb 14.4, ferritin 16, iron saturation 14% - PLAN: Anemia has resolved and she is asymptomatic.  Trial of oral iron alone to see if this will bring her iron levels up to normal. - Repeat labs and RTC with office visit in 3 months  - Suspect that elevated free light chains are related to her CKD.  SPEP was normal, but we will repeat FLC and immunofixation at her next visit.  2.  Folic acid deficiency - Labs on 6/76/7209 shows folic acid 5. - She is taking multivitamin and vitamin B12 at home since May 2023 - Labs from June show improved folic acid at 47.0 - PLAN: Continue multivitamin.  We will check folic acid follow-up visit.  3.  Social/family history: - Works at [employer] at [employer].  Non-smoker. - No family history of anemia or malignancy.   PLAN SUMMARY & DISPOSITION: Labs in 3 months Office visit 1 week after labs  All questions were answered. The patient knows to call the clinic with any problems, questions or concerns.  Medical decision making: Low  Time spent on visit: I  spent 15 minutes counseling the patient face to face. The total time spent in the appointment was 22 minutes and more than 50% was on counseling.   Harriett Rush, PA-C  09/07/2022 11:30 AM

## 2022-09-07 ENCOUNTER — Inpatient Hospital Stay (HOSPITAL_BASED_OUTPATIENT_CLINIC_OR_DEPARTMENT_OTHER): Payer: Managed Care, Other (non HMO) | Admitting: Physician Assistant

## 2022-09-07 VITALS — BP 148/87 | HR 82 | Temp 98.4°F | Resp 18 | Wt 219.6 lb

## 2022-09-07 DIAGNOSIS — R7689 Other specified abnormal immunological findings in serum: Secondary | ICD-10-CM

## 2022-09-07 DIAGNOSIS — R768 Other specified abnormal immunological findings in serum: Secondary | ICD-10-CM

## 2022-09-07 DIAGNOSIS — E538 Deficiency of other specified B group vitamins: Secondary | ICD-10-CM

## 2022-09-07 DIAGNOSIS — N1832 Chronic kidney disease, stage 3b: Secondary | ICD-10-CM

## 2022-09-07 DIAGNOSIS — D500 Iron deficiency anemia secondary to blood loss (chronic): Secondary | ICD-10-CM

## 2022-09-07 DIAGNOSIS — D509 Iron deficiency anemia, unspecified: Secondary | ICD-10-CM | POA: Diagnosis not present

## 2022-09-07 NOTE — Patient Instructions (Signed)
Tracy Joseph at [place] **   You were seen today by Tarri Abernethy PA-C for your iron deficiency anemia.   Your blood levels are back to normal! Your iron levels are still slightly low. Continue to take the iron pill once daily. We will recheck your iron levels again in 3 months to make sure that they are continuing to improve.  (If they are still low at your next visit, despite taking the iron pill, we would consider additional IV iron.) Continue to take vitamin B12 and multivitamin at home as well.  FOLLOW-UP APPOINTMENT: Labs in 3 months with office visit 1 week after  ** Thank you for trusting me with your healthcare!  I strive to provide all of my patients with quality care at each visit.  If you receive a survey for this visit, I would be so grateful to you for taking the time to provide feedback.  Thank you in advance!  ~ Jessee Mezera                   Dr. Derek Jack   &   Tarri Abernethy, PA-C   - - - - - - - - - - - - - - - - - -    Thank you for choosing [place] at [facility] to provide your oncology and hematology care.  To afford each patient quality time with our provider, please arrive at least 15 minutes before your scheduled appointment time.   If you have a lab appointment with the [facility] please come in thru the Main Entrance and check in at the main information desk.  You need to re-schedule your appointment should you arrive 10 or more minutes late.  We strive to give you quality time with our providers, and arriving late affects you and other patients whose appointments are after yours.  Also, if you no show three or more times for appointments you may be dismissed from the clinic at the providers discretion.     Again, thank you for choosing [facility].  Our hope is that these requests will decrease the amount of time that you wait before being seen  by our physicians.       _____________________________________________________________  Should you have questions after your visit to [facility], please contact our office at [phone] and follow the prompts.  Our office hours are 8:00 a.m. and 4:30 p.m. Monday - Friday.  Please note that voicemails left after 4:00 p.m. may not be returned until the following business day.  We are closed weekends and major holidays.  You do have access to a nurse 24-7, just call the main number to the clinic [phone] and do not press any options, hold on the line and a nurse will answer the phone.    For prescription refill requests, have your pharmacy contact our office and allow 72 hours.

## 2022-09-25 ENCOUNTER — Encounter (HOSPITAL_BASED_OUTPATIENT_CLINIC_OR_DEPARTMENT_OTHER): Payer: Self-pay | Admitting: Cardiovascular Disease

## 2022-09-25 ENCOUNTER — Ambulatory Visit (HOSPITAL_BASED_OUTPATIENT_CLINIC_OR_DEPARTMENT_OTHER): Payer: Managed Care, Other (non HMO) | Admitting: Cardiovascular Disease

## 2022-09-25 VITALS — BP 176/96 | HR 78 | Ht 65.0 in | Wt 221.4 lb

## 2022-09-25 DIAGNOSIS — I1A0 Resistant hypertension: Secondary | ICD-10-CM | POA: Diagnosis not present

## 2022-09-25 DIAGNOSIS — I5042 Chronic combined systolic (congestive) and diastolic (congestive) heart failure: Secondary | ICD-10-CM | POA: Diagnosis not present

## 2022-09-25 DIAGNOSIS — Z006 Encounter for examination for normal comparison and control in clinical research program: Secondary | ICD-10-CM

## 2022-09-25 DIAGNOSIS — R7303 Prediabetes: Secondary | ICD-10-CM

## 2022-09-25 DIAGNOSIS — G4733 Obstructive sleep apnea (adult) (pediatric): Secondary | ICD-10-CM | POA: Diagnosis not present

## 2022-09-25 DIAGNOSIS — Z5181 Encounter for therapeutic drug level monitoring: Secondary | ICD-10-CM

## 2022-09-25 LAB — URINALYSIS
Bilirubin, UA: NEGATIVE
Glucose, UA: NEGATIVE
Ketones, UA: NEGATIVE
Nitrite, UA: NEGATIVE
RBC, UA: NEGATIVE
Specific Gravity, UA: 1.012 (ref 1.005–1.030)
Urobilinogen, Ur: 0.2 mg/dL (ref 0.2–1.0)
pH, UA: 6.5 (ref 5.0–7.5)

## 2022-09-25 NOTE — Assessment & Plan Note (Signed)
LVEF was 45 to 50% during her recent hospitalization.  She is euvolemic.  Adding Entresto as above.  Continue labetalol and blood pressure control as above.  Renal function worsened with spironolactone in the past.  We will retry if able.  Would prefer to first get Entresto on board.

## 2022-09-25 NOTE — Progress Notes (Signed)
[facility] Initial Assessment:    Date:  09/25/2022   ID:  DEMITRI SMOLAR, DOB December 29, 1970, MRN [id number]  PCP:  Kerri Perches, MD  Cardiologist:  Christell Constant, MD  Nephrologist:  Referring MD: Kerri Perches, MD   CC: Hypertension  History of Present Illness:    SHARAIN WOTTON is a 51 y.o. female with a hx of hypertension, prediabetes, hyperlipidemia, CKD IIIb, and OSA, here to establish care in the [facility]. She saw Dr. Izora Ribas in 2021 for malignant hypertension. At the time she was struggling with a lot of stress. Blood pressures were in the 200s/140s. He added spironolactone and referred her for a sleep study. She was also started on atorvastatin. Her kidney function worsened so spironolactone was stopped and she was started on labetalol. She followed up with our pharmacist and was working on lifestyle improvement. Isordil was added to her regimen. She was seen in the ED with chest pain 04/2022. BP was 220/127. BNP was 140 and high sensitivity troponin was mildly elevated but flat. She was treated with IV lasix and a nitroglycerin drip. Echo revealed LVEF 45-50% with severe LVH. The LV cavity was severely dilated.  She saw Dr. Fransico Him and was advised in lifestyle medicine. Labs were negative for hyperaldosteronism.   Today, she states she has been struggling with hypertension since her early 51's. It has always been difficult to control. At home her blood pressure is frequently similar to today's in clinic reading 181/104. She notes that many members of her family also have hypertension and are taking similar medications. They often share strategies for managing hypertension with lifestyle changes. For exercise she walks at work, and goes to [employer] 3 times a week. Her routine consists of using the elliptical and treadmill for 15 minutes each, and some weight lifting. Initially she had felt short of breath while exercising.  Now this has resolved after adhering to her routine for 2 years. She cooks her meals at least twice a week, and orders out otherwise. She enjoys potato chips. Since May she has been watching her salt and opting for alternative seasonings. Intermittently she tries to cut back on drinking sodas. Normally she may have one bottle of soda or tea. She has never smoked. Every once in a while she may take tylenol for a rare headache; she now avoids Aleve. Usually she takes her medications with food as she tends to feel nauseous with her meds. Also, her sleep study was positive for sleep apnea. She has a follow-up visit pending. She denies any palpitations, chest pain, or peripheral edema. No lightheadedness, syncope, orthopnea, or PND.  Previous antihypertensives: Ace inhibitors - cough Amlodipine - LE edema Benicar - cough, dry, pruritis  Past Medical History:  Diagnosis Date   Chronic combined systolic and diastolic heart failure (HCC) 05/04/2022   New dx in 04/2022 during hospitalization. Severe LVH, grade2 diastolic dysfunction, EF 45 to 50 %   Essential hypertension    Goiter    Initially hyperthyroid treated with RAI, now hypothyroid - follows with Dr. Everardo All   Hypertension    Phreesia 10/12/2020   Hypothyroidism    Obstructive sleep apnea    Suspected    Prediabetes    Seasonal allergies    Tubular adenoma of colon     Past Surgical History:  Procedure Laterality Date   BIOPSY  07/25/2022   Procedure: BIOPSY;  Surgeon: Dolores Frame, MD;  [facility];  Service: Gastroenterology;;   COLONOSCOPY N/A 05/03/2017   Procedure: COLONOSCOPY;  Surgeon: Malissa Hippo, MD;  Location: [facility];  Service: Endoscopy;  Laterality: N/A;  930   COLONOSCOPY WITH PROPOFOL N/A 07/25/2022   Procedure: COLONOSCOPY WITH PROPOFOL;  Surgeon: Dolores Frame, MD;  Location: [facility];  Service: Gastroenterology;  Laterality: N/A;  1130 am   ESOPHAGOGASTRODUODENOSCOPY  (EGD) WITH PROPOFOL N/A 07/25/2022   Procedure: ESOPHAGOGASTRODUODENOSCOPY (EGD) WITH PROPOFOL;  Surgeon: Dolores Frame, MD;  Location: [facility];  Service: Gastroenterology;  Laterality: N/A;   TUBAL LIGATION  2005    Current Medications: Current Meds  Medication Sig   amLODipine (NORVASC) 10 MG tablet Take 1 tablet (10 mg total) by mouth daily.   cloNIDine (CATAPRES - DOSED IN MG/24 HR) 0.2 mg/24hr patch Place 1 patch (0.2 mg total) onto the skin once a week. Change every Friday   ferrous sulfate 325 (65 FE) MG EC tablet Take 1 tablet (325 mg total) by mouth daily with breakfast.   furosemide (LASIX) 40 MG tablet Take 1 tablet (40 mg total) by mouth daily.   hydrALAZINE (APRESOLINE) 100 MG tablet Take 1 tablet (100 mg total) by mouth 3 (three) times daily.   isosorbide mononitrate (IMDUR) 60 MG 24 hr tablet Take 1 tablet (60 mg total) by mouth daily.   labetalol (NORMODYNE) 300 MG tablet Take 1 tablet (300 mg total) by mouth 2 (two) times daily. For BP   levothyroxine (SYNTHROID) 175 MCG tablet Take 1 tablet (175 mcg total) by mouth daily before breakfast.   Multiple Vitamin (MULTIVITAMIN ADULT PO) Take 1 tablet by mouth daily.   norethindrone (AYGESTIN) 5 MG tablet Take 1 tablet (5 mg total) by mouth daily.   omeprazole (PRILOSEC) 40 MG capsule Take 1 capsule (40 mg total) by mouth 2 (two) times daily for 14 days.   sacubitril-valsartan (ENTRESTO) 24-26 MG Take 1 tablet by mouth 2 (two) times daily.   vitamin B-12 (CYANOCOBALAMIN) 1000 MCG tablet Take 1 tablet (1,000 mcg total) by mouth daily.   Vitamin D, Ergocalciferol, (DRISDOL) 1.25 MG (50000 UNIT) CAPS capsule Take 50,000 Units by mouth every Wednesday.     Allergies:   Ace inhibitors, Dust mite extract, Latex, and Pollen extract   Social History   Socioeconomic History   Marital status: Married    Spouse name: Not on file   Number of children: 1   Years of education: Not on file   Highest education level: Not  on file  Occupational History   Occupation: full time student for [employer]  Tobacco Use   Smoking status: Never   Smokeless tobacco: Never  Vaping Use   Vaping Use: Never used  Substance and Sexual Activity   Alcohol use: No   Drug use: No   Sexual activity: Yes    Birth control/protection: None  Other Topics Concern   Not on file  Social History Narrative   Not on file   Social Determinants of Health   Financial Resource Strain: Low Risk  (03/29/2022)   Overall Financial Resource Strain (CARDIA)    Difficulty of Paying Living Expenses: Not hard at all  Food Insecurity: No Food Insecurity (03/29/2022)   Hunger Vital Sign    Worried About Running Out of Food in the Last Year: Never true    Ran Out of Food in the Last Year: Never true  Transportation Needs: No Transportation Needs (03/29/2022)   PRAPARE - Transportation    Lack of Transportation (  Medical): No    Lack of Transportation (Non-Medical): No  Physical Activity: Sufficiently Active (03/29/2022)   Exercise Vital Sign    Days of Exercise per Week: 3 days    Minutes of Exercise per Session: 60 min  Stress: No Stress Concern Present (03/29/2022)   [employer] of Occupational Health - Occupational Stress Questionnaire    Feeling of Stress : Not at all  Social Connections: Socially Integrated (03/29/2022)   Social Connection and Isolation Panel [NHANES]    Frequency of Communication with Friends and Family: Twice a week    Frequency of Social Gatherings with Friends and Family: Twice a week    Attends Religious Services: More than 4 times per year    Active Member of [employer] or Organizations: Yes    Attends Banker Meetings: 1 to 4 times per year    Marital Status: Married     Family History: The patient's family history includes Coronary artery disease in her maternal aunt and maternal aunt; Hyperlipidemia in her mother; Hypertension in her cousin, maternal aunt, maternal aunt, and mother; Thyroid disease in  her paternal grandmother.  ROS:   Please see the history of present illness.    All other systems reviewed and are negative.  EKGs/Labs/Other Studies Reviewed:    Echocardiogram  05/04/2022:  1. Left ventricular ejection fraction, by estimation, is 45 to 50%. The  left ventricle has mildly decreased function. The left ventricle has no  regional wall motion abnormalities. The left ventricular internal cavity  size was severely dilated. There is  severe left ventricular hypertrophy. Left ventricular diastolic parameters  are consistent with Grade II diastolic dysfunction (pseudonormalization).  Elevated left atrial pressure.   2. Right ventricular systolic function is normal. The right ventricular  size is normal.   3. Left atrial size was severely dilated.   4. Right atrial size was mildly dilated.   5. The mitral valve is normal in structure. Mild mitral valve  regurgitation.   6. The aortic valve is tricuspid. Aortic valve regurgitation is not  visualized. Aortic valve sclerosis is present, with no evidence of aortic  valve stenosis.   7. The inferior vena cava is normal in size with greater than 50%  respiratory variability, suggesting right atrial pressure of 3 mmHg.    EKG:  EKG is personally reviewed. 09/25/2022:  EKG was not ordered.   Recent Labs: 05/04/2022: B Natriuretic Peptide 140.0; Magnesium 2.1 05/17/2022: TSH 2.640 07/20/2022: ALT 8; BUN 16; Creatinine, Ser 1.26; Potassium 3.6; Sodium 137 08/31/2022: Hemoglobin 14.4; Platelets 251   Recent Lipid Panel    Component Value Date/Time   CHOL 214 (H) 03/08/2022 0930   TRIG 135 03/08/2022 0930   HDL 50 03/08/2022 0930   CHOLHDL 4.3 03/08/2022 0930   CHOLHDL 4.7 09/16/2019 0847   VLDL 23 01/24/2017 0916   LDLCALC 140 (H) 03/08/2022 0930   LDLCALC 136 (H) 09/16/2019 0847    Physical Exam:    VS:  BP (!) 176/96 (BP Location: Right Arm, Patient Position: Sitting, Cuff Size: Large)   Pulse 78   Ht 5\' 5"  (1.651 m)    Wt 221 lb 6.4 oz (100.4 kg)   SpO2 95%   BMI 36.84 kg/m  , BMI Body mass index is 36.84 kg/m. GENERAL:  Well appearing HEENT: Pupils equal round and reactive, fundi not visualized, oral mucosa unremarkable NECK:  No jugular venous distention, waveform within normal limits, carotid upstroke brisk and symmetric, no bruits, no thyromegaly LUNGS:  Clear  to auscultation bilaterally HEART:  RRR.  PMI not displaced or sustained,S1 and S2 within normal limits, no S3, no S4, no clicks, no rubs, 2/6 systolic murmur. 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  Blood pressure is uncontrolled despite being on amlodipine, clonidine, hydralazine, labetalol, and Imdur.  She had cough with lisinopril.  It is unclear why olmesartan was discontinued but she does not recall having any specific side effects.  We will have her start Entresto 24/26 mg twice daily given her chronic kidney disease and systolic dysfunction.  Check a BMP in a week.  If renal function remains abnormal we will increase the dose to 49/51 mg.  She is interested in our remote patient monitoring study and consents to monitoring in the [employer] remote patient monitoring system.  We will check renal artery Dopplers.  Renal and aldosterone levels were within normal limits.  Refer to the PREP at the [facility] and encouraged her to continue exercising at least 150 minutes weekly.  She has an outpatient sleep study that is already pending.  OSA (obstructive sleep apnea) Sleep study pending  Chronic combined  systolic and diastolic heart failure (HCC) LVEF was 45 to 50% during her recent hospitalization.  She is euvolemic.  Adding Entresto as above.  Continue labetalol and blood pressure control as above.  Renal function worsened with spironolactone in the past.  We will retry if able.  Would prefer to first get Entresto on board.  Prediabetes Hemoglobin A1c 6.1%.  Continue working on diet and exercise.  Consider GLP-1 agonist.   Screening for Secondary Hypertension:     09/25/2022    9:13 AM  Causes  Drugs/Herbals Screened     - Comments One tea daily, trying to limit sodium, rare EtOH, no tobaccos use.  No NSAIDS.  Renovascular HTN Screened     - Comments Check renal artery Dopplers  Sleep Apnea Screened     - Comments sleep study pending  Thyroid Disease Screened  Hyperaldosteronism Screened  Pheochromocytoma N/A  Cushing's Syndrome N/A  Hyperparathyroidism Screened  Coarctation of the Aorta Screened     - Comments BP symmetric  Compliance Screened    Relevant Labs/Studies:    Latest Ref Rng & Units 07/20/2022   11:44 AM 05/17/2022   10:23 AM 05/07/2022    3:58 AM  Basic Labs  Sodium 135 - 145 mmol/L 137  141  137   Potassium 3.5 - 5.1 mmol/L 3.6  4.0  3.2   Creatinine 0.44 - 1.00 mg/dL 1.30  8.65  7.84        Latest Ref Rng & Units 05/17/2022   10:23 AM 03/08/2022    9:30 AM  Thyroid   TSH 0.450 - 4.500 uIU/mL 2.640  21.400        Latest Ref Rng & Units 05/04/2022    8:38 AM  Renin/Aldosterone   Aldosterone 0.0 - 30.0 ng/dL 69.6              29/04/2840    9:36 AM  Renovascular   Renal Artery [place] Completed Yes  Disposition:    FU with APP/PharmD in 1 month for the next 3 months.   FU with Coleta Grosshans C. Duke Salvia, MD, [facility] in 4 months.  Medication Adjustments/Labs and Tests Ordered: Current medicines are reviewed at length with the patient today.  Concerns regarding medicines are outlined above.   Orders Placed This Encounter  Procedures   Urinalysis   Basic  metabolic panel   Amb Referral To Provider Referral Exercise Program (P.R.E.P)   VAS US RENAL ARTERY DUPLEX   No orders of the defined types were placed in this encounter.  I,Mathew Stumpf,acting as a Neurosurgeon for Chilton Si, MD.,have documented all relevant documentation on the behalf of Chilton Si, MD,as directed by  Chilton Si, MD while in the presence of Chilton Si, MD.  I, Mica Releford C. Duke Salvia, MD have reviewed all documentation for this visit.  The documentation of the exam, diagnosis, procedures, and orders on 09/25/2022 are all accurate and complete.   Signed, Chilton Si, MD  09/25/2022 11:27 AM    [facility]

## 2022-09-25 NOTE — Patient Instructions (Signed)
Medication Instructions:  START ENTRESTO 24/26 MG TWICE A DAY    Labwork: URINALYSIS TODAY   BMET IN 1 WEEK    Testing/Procedures: Your physician has requested that you have a renal artery duplex. During this test, an ultrasound is used to evaluate blood flow to the kidneys. Allow one hour for this exam. Do not eat after midnight the day before and avoid carbonated beverages. Take your medications as you usually do.   Follow-Up: 10/26/2022 8:00 AM WITH PHARM D AT [place] will receive a phone call from the PREP exercise and nutrition program to schedule an initial assessment.   Special Instructions:   MONITOR YOUR BLOOD PRESSURE TWICE A DAY WITH MACHINE PROVIDED. LOG IN BOOK AND BRING BOOK TO FOLLOW  UP   DASH Eating Plan DASH stands for "Dietary Approaches to Stop Hypertension." The DASH eating plan is a healthy eating plan that has been shown to reduce high blood pressure (hypertension). It may also reduce your risk for type 2 diabetes, heart disease, and stroke. The DASH eating plan may also help with weight loss. What are tips for following this plan?  General guidelines Avoid eating more than 2,300 mg (milligrams) of salt (sodium) a day. If you have hypertension, you may need to reduce your sodium intake to 1,500 mg a day. Limit alcohol intake to no more than 1 drink a day for nonpregnant women and 2 drinks a day for men. One drink equals 12 oz of beer, 5 oz of wine, or 1 oz of hard liquor. Work with your health care provider to maintain a healthy body weight or to lose weight. Ask what an ideal weight is for you. Get at least 30 minutes of exercise that causes your heart to beat faster (aerobic exercise) most days of the week. Activities may include walking, swimming, or biking. Work with your health care provider or diet and nutrition specialist (dietitian) to adjust your eating plan to your individual calorie needs. Reading food labels  Check food labels for  the amount of sodium per serving. Choose foods with less than 5 percent of the Daily Value of sodium. Generally, foods with less than 300 mg of sodium per serving fit into this eating plan. To find whole grains, look for the word "whole" as the first word in the ingredient list. Shopping Buy products labeled as "low-sodium" or "no salt added." Buy fresh foods. Avoid canned foods and premade or frozen meals. Cooking Avoid adding salt when cooking. Use salt-free seasonings or herbs instead of table salt or sea salt. Check with your health care provider or pharmacist before using salt substitutes. Do not fry foods. Cook foods using healthy methods such as baking, boiling, grilling, and broiling instead. Cook with heart-healthy oils, such as olive, canola, soybean, or sunflower oil. Meal planning Eat a balanced diet that includes: 5 or more servings of fruits and vegetables each day. At each meal, try to fill half of your plate with fruits and vegetables. Up to 6-8 servings of whole grains each day. Less than 6 oz of lean meat, poultry, or fish each day. A 3-oz serving of meat is about the same size as a deck of cards. One egg equals 1 oz. 2 servings of low-fat dairy each day. A serving of nuts, seeds, or beans 5 times each week. Heart-healthy fats. Healthy fats called Omega-3 fatty acids are found in foods such as flaxseeds and coldwater fish, like sardines, salmon, and mackerel. Limit how much  you eat of the following: Canned or prepackaged foods. Food that is high in trans fat, such as fried foods. Food that is high in saturated fat, such as fatty meat. Sweets, desserts, sugary drinks, and other foods with added sugar. Full-fat dairy products. Do not salt foods before eating. Try to eat at least 2 vegetarian meals each week. Eat more home-cooked food and less restaurant, buffet, and fast food. When eating at a restaurant, ask that your food be prepared with less salt or no salt, if  possible. What foods are recommended? The items listed may not be a complete list. Talk with your dietitian about what dietary choices are best for you. Grains Whole-grain or whole-wheat bread. Whole-grain or whole-wheat pasta. Brown rice. [place]. Bulgur. Whole-grain and low-sodium cereals. Pita bread. Low-fat, low-sodium crackers. Whole-wheat flour tortillas. Vegetables Fresh or frozen vegetables (raw, steamed, roasted, or grilled). Low-sodium or reduced-sodium tomato and vegetable juice. Low-sodium or reduced-sodium tomato sauce and tomato paste. Low-sodium or reduced-sodium canned vegetables. Fruits All fresh, dried, or frozen fruit. Canned fruit in natural juice (without added sugar). Meat and other protein foods Skinless chicken or [place]. Ground chicken or [place]. Pork with fat trimmed off. Fish and seafood. Egg whites. Dried beans, peas, or lentils. Unsalted nuts, nut butters, and seeds. Unsalted canned beans. Lean cuts of beef with fat trimmed off. Low-sodium, lean deli meat. Dairy Low-fat (1%) or fat-free (skim) milk. Fat-free, low-fat, or reduced-fat cheeses. Nonfat, low-sodium ricotta or cottage cheese. Low-fat or nonfat yogurt. Low-fat, low-sodium cheese. Fats and oils Soft margarine without trans fats. Vegetable oil. Low-fat, reduced-fat, or light mayonnaise and salad dressings (reduced-sodium). Canola, safflower, olive, soybean, and sunflower oils. Avocado. Seasoning and other foods Herbs. Spices. Seasoning mixes without salt. Unsalted popcorn and pretzels. Fat-free sweets. What foods are not recommended? The items listed may not be a complete list. Talk with your dietitian about what dietary choices are best for you. Grains Baked goods made with fat, such as croissants, muffins, or some breads. Dry pasta or rice meal packs. Vegetables Creamed or fried vegetables. Vegetables in a cheese sauce. Regular canned vegetables (not low-sodium or reduced-sodium). Regular canned  tomato sauce and paste (not low-sodium or reduced-sodium). Regular tomato and vegetable juice (not low-sodium or reduced-sodium). Angie Fava. Olives. Fruits Canned fruit in a light or heavy syrup. Fried fruit. Fruit in cream or butter sauce. Meat and other protein foods Fatty cuts of meat. Ribs. Fried meat. Berniece Salines. Sausage. Bologna and other processed lunch meats. Salami. Fatback. Hotdogs. Bratwurst. Salted nuts and seeds. Canned beans with added salt. Canned or smoked fish. Whole eggs or egg yolks. Chicken or [place] with skin. Dairy Whole or 2% milk, cream, and half-and-half. Whole or full-fat cream cheese. Whole-fat or sweetened yogurt. Full-fat cheese. Nondairy creamers. Whipped toppings. Processed cheese and cheese spreads. Fats and oils Butter. Stick margarine. Lard. Shortening. Ghee. Bacon fat. Tropical oils, such as coconut, palm kernel, or palm oil. Seasoning and other foods Salted popcorn and pretzels. Onion salt, garlic salt, seasoned salt, table salt, and sea salt. Worcestershire sauce. Tartar sauce. Barbecue sauce. Teriyaki sauce. Soy sauce, including reduced-sodium. Steak sauce. Canned and packaged gravies. Fish sauce. Oyster sauce. Cocktail sauce. Horseradish that you find on the shelf. Ketchup. Mustard. Meat flavorings and tenderizers. Bouillon cubes. Hot sauce and Tabasco sauce. Premade or packaged marinades. Premade or packaged taco seasonings. Relishes. Regular salad dressings. Where to find more information: National Heart, Lung, and [place]: https://wilson-eaton.com/ American Heart Association: www.heart.org Summary The DASH eating plan is a  healthy eating plan that has been shown to reduce high blood pressure (hypertension). It may also reduce your risk for type 2 diabetes, heart disease, and stroke. With the DASH eating plan, you should limit salt (sodium) intake to 2,300 mg a day. If you have hypertension, you may need to reduce your sodium intake to 1,500 mg a day. When on the  DASH eating plan, aim to eat more fresh fruits and vegetables, whole grains, lean proteins, low-fat dairy, and heart-healthy fats. Work with your health care provider or diet and nutrition specialist (dietitian) to adjust your eating plan to your individual calorie needs. This information is not intended to replace advice given to you by your health care provider. Make sure you discuss any questions you have with your health care provider. Document Released: 11/23/2011 Document Revised: 11/16/2017 Document Reviewed: 11/27/2016 Elsevier Patient Education  2020 [place]. 11/9

## 2022-09-25 NOTE — Research (Signed)
  Subject Name: Tracy Joseph met inclusion and exclusion criteria for the Virtual Care and Social Determinant Interventions for the management of hypertension trial.  The informed consent form, study requirements and expectations were reviewed with the subject by Dr. Oval Linsey and myself. The subject was given the opportunity to read the consent and ask questions. The subject verbalized understanding of the trial requirements.  All questions were addressed prior to the signing of the consent form. The subject agreed to participate in the trial and signed the informed consent. The informed consent was obtained prior to performance of any protocol-specific procedures for the subject.  A copy of the signed informed consent was given to the subject and a copy was placed in the subject's medical record.  Tracy Joseph was randomized to Group 1.

## 2022-09-25 NOTE — Assessment & Plan Note (Signed)
Hemoglobin A1c 6.1%.  Continue working on diet and exercise.  Consider GLP-1 agonist.

## 2022-09-25 NOTE — Assessment & Plan Note (Signed)
Sleep study pending

## 2022-09-25 NOTE — Assessment & Plan Note (Signed)
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  Blood pressure is uncontrolled despite being on amlodipine, clonidine, hydralazine, labetalol, and Imdur.  She had cough with lisinopril.  It is unclear why olmesartan was discontinued but she does not recall having any specific side effects.  We will have her start Entresto 24/26 mg twice daily given her chronic kidney disease and systolic dysfunction.  Check a BMP in a week.  If renal function remains abnormal we will increase the dose to 49/51 mg.  She is interested in our remote patient monitoring study and consents to monitoring in the [facility] remote patient monitoring system.  We will check renal artery Dopplers.  Renal and aldosterone levels were within normal limits.  Refer to the PREP at the [facility] and encouraged her to continue exercising at least 150 minutes weekly.  She has an outpatient sleep study that is already pending.

## 2022-09-26 ENCOUNTER — Telehealth (HOSPITAL_BASED_OUTPATIENT_CLINIC_OR_DEPARTMENT_OTHER): Payer: Self-pay | Admitting: Cardiovascular Disease

## 2022-09-26 ENCOUNTER — Telehealth (HOSPITAL_BASED_OUTPATIENT_CLINIC_OR_DEPARTMENT_OTHER): Payer: Self-pay | Admitting: *Deleted

## 2022-09-26 NOTE — Telephone Encounter (Signed)
ENTRESTO 24-26 MG #1 LOT # [id number] EXP 07/2024 GIVEN TO PATIENT

## 2022-09-26 NOTE — Telephone Encounter (Signed)
Left message for patient to call and schedule the renal artery doppler ordered by Dr. Oval Linsey

## 2022-09-27 ENCOUNTER — Encounter: Payer: Self-pay | Admitting: Obstetrics & Gynecology

## 2022-09-27 ENCOUNTER — Ambulatory Visit: Payer: Managed Care, Other (non HMO) | Admitting: Obstetrics & Gynecology

## 2022-09-27 ENCOUNTER — Ambulatory Visit: Payer: Managed Care, Other (non HMO) | Admitting: "Endocrinology

## 2022-09-27 ENCOUNTER — Telehealth: Payer: Self-pay | Admitting: *Deleted

## 2022-09-27 ENCOUNTER — Other Ambulatory Visit (HOSPITAL_COMMUNITY)
Admission: RE | Admit: 2022-09-27 | Discharge: 2022-09-27 | Disposition: A | Discharge status: Home / Self Care | Payer: Managed Care, Other (non HMO) | Source: Ambulatory Visit | Attending: Obstetrics & Gynecology | Admitting: Obstetrics & Gynecology

## 2022-09-27 VITALS — BP 152/100 | HR 73 | Ht 65.0 in | Wt 220.0 lb

## 2022-09-27 DIAGNOSIS — Z01419 Encounter for gynecological examination (general) (routine) without abnormal findings: Secondary | ICD-10-CM | POA: Insufficient documentation

## 2022-09-27 DIAGNOSIS — N920 Excessive and frequent menstruation with regular cycle: Secondary | ICD-10-CM

## 2022-09-27 MED ORDER — NORETHINDRONE ACETATE 5 MG PO TABS: 5.0000 mg | ORAL_TABLET | Freq: Every day | ORAL | 4 refills | Status: DC

## 2022-09-27 NOTE — Progress Notes (Addendum)
WELL-WOMAN EXAMINATION Patient name: Tracy Joseph MRN [id number]  Date of birth: [birth year] Chief Complaint:   Annual Exam  History of Present Illness:   Tracy Joseph is a 51 y.o. [phone] female being seen today for a routine well-woman exam and follow up regarding the following:  AUB: Treated with POPs and notes significant improvement of her menses.  Typically only light spotting each month. Overall happy with this and wishes to continue.  Did not complete US since she noted significant improvement with treatment.   Patient's last menstrual period was 06/27/2022.  Last pap completed today.  Last mammogram: 12/2021. Last colonoscopy: 07/2022     09/27/2022    8:51 AM 07/12/2022    9:28 AM 05/17/2022    9:41 AM 03/29/2022    9:03 AM 03/08/2022    8:38 AM  Depression screen PHQ 2/9  Decreased Interest 0 0 0 0 0  Down, Depressed, Hopeless 0 0 0 0 0  PHQ - 2 Score 0 0 0 0 0  Altered sleeping 0   0   Tired, decreased energy 0   0   Change in appetite 0   0   Feeling bad or failure about yourself  0   0   Trouble concentrating 0   0   Moving slowly or fidgety/restless 0   0   Suicidal thoughts 0   0   PHQ-9 Score 0   0       Review of Systems:   Pertinent items are noted in HPI Denies any headaches, blurred vision, fatigue, shortness of breath, chest pain, abdominal pain, bowel movements, urination, or intercourse unless otherwise stated above.  Pertinent History Reviewed:  Reviewed past medical,surgical, social and family history.  Reviewed problem list, medications and allergies. Physical Assessment:   Vitals:   09/27/22 0847  BP: (!) 152/100  Pulse: 73  Weight: 220 lb (99.8 kg)  Height: '5\' 5"'$  (1.651 m)  Body mass index is 36.61 kg/m.        Physical Examination:   General appearance - well appearing, and in no distress  Mental status - alert, oriented to person, place, and time  Psych:  She has a normal mood and affect  Skin - warm and dry, normal color,  no suspicious lesions noted  Chest - effort normal, all lung fields clear to auscultation bilaterally  Heart - normal rate and regular rhythm  Neck:  midline trachea, no thyromegaly or nodules  Breasts - breasts appear normal, no suspicious masses, no skin or nipple changes or  axillary nodes  Abdomen - soft, nontender, nondistended, no masses or organomegaly  Pelvic - VULVA: normal appearing vulva with no masses, tenderness or lesions  VAGINA: normal appearing vagina with normal color and discharge, no lesions  CERVIX: normal appearing cervix without discharge or lesions, no CMT  Thin prep pap is done with HR HPV cotesting  UTERUS: uterus is felt to be normal size, shape, consistency and nontender   ADNEXA: No adnexal masses or tenderness noted.  Extremities:  No swelling or varicosities noted  Chaperone: Journalist, newspaper & Plan:  1) Well-Woman Exam -pap collected, reviewed screening guidelines -other screening exams up to date  2) AUB -well controlled with current medication and plan to continue  3) Chronic HTN with elevated BP -pt did not take her medication yet this am -Being seen at HTN clinic  Meds:  Meds ordered this encounter  Medications   norethindrone (AYGESTIN)  5 MG tablet    Sig: Take 1 tablet (5 mg total) by mouth daily.    Dispense:  90 tablet    Refill:  4    Follow-up: Return in about 1 year (around 09/28/2023) for Annual.   [name], DO Attending [place], [place] for [place], [place]

## 2022-09-27 NOTE — Telephone Encounter (Signed)
Contacted patient regarding PREP Class referral. She is interested in attending at the [facility] since she works in [employer]. She will need a class time close to midday to accommodate her work lunch schedule. She was also offered the [facility] 1130-1245 class beginning 11/21/2022.

## 2022-09-28 ENCOUNTER — Other Ambulatory Visit: Payer: Self-pay | Admitting: Obstetrics & Gynecology

## 2022-09-28 DIAGNOSIS — B379 Candidiasis, unspecified: Secondary | ICD-10-CM

## 2022-09-28 LAB — CYTOLOGY - PAP
Adequacy: ABSENT
Chlamydia: NEGATIVE
Comment: NEGATIVE
Comment: NEGATIVE
Comment: NEGATIVE
Comment: NORMAL
Diagnosis: NEGATIVE
High risk HPV: NEGATIVE
Neisseria Gonorrhea: NEGATIVE
Trichomonas: NEGATIVE

## 2022-09-28 MED ORDER — FLUCONAZOLE 150 MG PO TABS: 150.0000 mg | ORAL_TABLET | Freq: Once | ORAL | 0 refills | Status: AC

## 2022-09-28 NOTE — Progress Notes (Signed)
Rx for yeast sent in

## 2022-10-07 LAB — T4, FREE: Free T4: 2.15 ng/dL — ABNORMAL HIGH (ref 0.82–1.77)

## 2022-10-07 LAB — TSH: TSH: 0.035 u[IU]/mL — ABNORMAL LOW (ref 0.450–4.500)

## 2022-10-09 ENCOUNTER — Encounter: Payer: Self-pay | Admitting: "Endocrinology

## 2022-10-09 ENCOUNTER — Ambulatory Visit (INDEPENDENT_AMBULATORY_CARE_PROVIDER_SITE_OTHER): Payer: Managed Care, Other (non HMO) | Admitting: "Endocrinology

## 2022-10-09 VITALS — BP 150/96 | HR 84 | Ht 65.0 in | Wt 222.2 lb

## 2022-10-09 DIAGNOSIS — E890 Postprocedural hypothyroidism: Secondary | ICD-10-CM | POA: Diagnosis not present

## 2022-10-09 DIAGNOSIS — E782 Mixed hyperlipidemia: Secondary | ICD-10-CM

## 2022-10-09 DIAGNOSIS — R7303 Prediabetes: Secondary | ICD-10-CM

## 2022-10-09 MED ORDER — LEVOTHYROXINE SODIUM 150 MCG PO TABS: 150.0000 ug | ORAL_TABLET | Freq: Every day | ORAL | 1 refills | Status: DC

## 2022-10-09 NOTE — Patient Instructions (Signed)
                                     Advice for Weight Management  -For most of us the best way to lose weight is by diet management. Generally speaking, diet management means consuming less calories intentionally which over time brings about progressive weight loss.  This can be achieved more effectively by avoiding ultra processed carbohydrates, processed meats, unhealthy fats.    It is critically important to know your numbers: how much calorie you are consuming and how much calorie you need. More importantly, our carbohydrates sources should be unprocessed naturally occurring  complex starch food items.  It is always important to balance nutrition also by  appropriate intake of proteins (mainly plant-based), healthy fats/oils, plenty of fruits and vegetables.   -The American College of Lifestyle Medicine (ACL M) recommends nutrition derived mostly from Whole Food, Plant Predominant Sources example an apple instead of applesauce or apple pie. Eat Plenty of vegetables, Mushrooms, fruits, Legumes, Whole Grains, Nuts, seeds in lieu of processed meats, processed snacks/pastries red meat, poultry, eggs.  Use only water or unsweetened tea for hydration.  The College also recommends the need to stay away from risky substances including alcohol, smoking; obtaining 7-9 hours of restorative sleep, at least 150 minutes of moderate intensity exercise weekly, importance of healthy social connections, and being mindful of stress and seek help when it is overwhelming.    -Sticking to a routine mealtime to eat 3 meals a day and avoiding unnecessary snacks is shown to have a big role in weight control. Under normal circumstances, the only time we burn stored energy is when we are hungry, so allow  some hunger to take place- hunger means no food between appropriate meal times, only water.  It is not advisable to starve.   -It is better to avoid simple carbohydrates including:  Cakes, Sweet Desserts, Ice Cream, Soda (diet and regular), Sweet Tea, Candies, Chips, Cookies, Store Bought Juices, Alcohol in Excess of  1-2 drinks a day, Lemonade,  Artificial Sweeteners, Doughnuts, Coffee Creamers, "Sugar-free" Products, etc, etc.  This is not a complete list.....    -Consulting with certified diabetes educators is proven to provide you with the most accurate and current information on diet.  Also, you may be  interested in discussing diet options/exchanges , we can schedule a visit with Tracy Joseph, RDN, CDE for individualized nutrition education.  -Exercise: If you are able: 30 -60 minutes a day ,4 days a week, or 150 minutes of moderate intensity exercise weekly.    The longer the better if tolerated.  Combine stretch, strength, and aerobic activities.  If you were told in the past that you have high risk for cardiovascular diseases, or if you are currently symptomatic, you may seek evaluation by your heart doctor prior to initiating moderate to intense exercise programs.                                  Additional Care Considerations for Diabetes/Prediabetes   -Diabetes  is a chronic disease.  The most important care consideration is regular follow-up with your diabetes care provider with the goal being avoiding or delaying its complications and to take advantage of advances in medications and technology.  If appropriate actions are taken early enough, type 2 diabetes can even be   reversed.  Seek information from the right source.  - Whole Food, Plant Predominant Nutrition is highly recommended: Eat Plenty of vegetables, Mushrooms, fruits, Legumes, Whole Grains, Nuts, seeds in lieu of processed meats, processed snacks/pastries red meat, poultry, eggs as recommended by American College of  Lifestyle Medicine (ACLM).  -Type 2 diabetes is known to coexist with other important comorbidities such as high blood pressure and high cholesterol.  It is critical to control not only the  diabetes but also the high blood pressure and high cholesterol to minimize and delay the risk of complications including coronary artery disease, stroke, amputations, blindness, etc.  The good news is that this diet recommendation for type 2 diabetes is also very helpful for managing high cholesterol and high blood blood pressure.  - Studies showed that people with diabetes will benefit from a class of medications known as ACE inhibitors and statins.  Unless there are specific reasons not to be on these medications, the standard of care is to consider getting one from these groups of medications at an optimal doses.  These medications are generally considered safe and proven to help protect the heart and the kidneys.    - People with diabetes are encouraged to initiate and maintain regular follow-up with eye doctors, foot doctors, dentists , and if necessary heart and kidney doctors.     - It is highly recommended that people with diabetes quit smoking or stay away from smoking, and get yearly  flu vaccine and pneumonia vaccine at least every 5 years.  See above for additional recommendations on exercise, sleep, stress management , and healthy social connections.      

## 2022-10-09 NOTE — Progress Notes (Signed)
10/09/2022, 6:58 PM   Endocrinology follow-up note  Subjective:    Patient ID: Tracy Joseph, female    DOB: 05-17-1971, PCP [name], MD   Past Medical History:  Diagnosis Date   Chronic combined systolic and diastolic heart failure ([place]) 05/04/2022   New dx in 04/2022 during hospitalization. Severe LVH, grade2 diastolic dysfunction, EF 45 to 50 %   Essential hypertension    Goiter    Initially hyperthyroid treated with RAI, now hypothyroid - follows with Dr. [name]   Hypertension    Phreesia 10/12/2020   Hypothyroidism    Obstructive sleep apnea    Suspected    Prediabetes    Seasonal allergies    Tubular adenoma of colon    Past Surgical History:  Procedure Laterality Date   BIOPSY  07/25/2022   Procedure: BIOPSY;  Surgeon: Harvel Quale, MD;  [facility];  Service: Gastroenterology;;   COLONOSCOPY N/A 05/03/2017   Procedure: COLONOSCOPY;  Surgeon: Rogene Houston, MD;  [facility]: [facility];  Service: Endoscopy;  Laterality: N/A;  930   COLONOSCOPY WITH PROPOFOL N/A 07/25/2022   Procedure: COLONOSCOPY WITH PROPOFOL;  Surgeon: Harvel Quale, MD;  Location: [facility];  Service: Gastroenterology;  Laterality: N/A;  1130 am   ESOPHAGOGASTRODUODENOSCOPY (EGD) WITH PROPOFOL N/A 07/25/2022   Procedure: ESOPHAGOGASTRODUODENOSCOPY (EGD) WITH PROPOFOL;  Surgeon: Harvel Quale, MD;  Location: [facility];  Service: Gastroenterology;  Laterality: N/A;   TUBAL LIGATION  2005   Social History   Socioeconomic History   Marital status: Married    Spouse name: Not on file   Number of children: 1   Years of education: Not on file   Highest education level: Not on file  Occupational History   Occupation: full time student for L[employer]  Tobacco Use   Smoking status: Never   Smokeless tobacco: Never  Vaping Use   Vaping Use: Never used  Substance and Sexual Activity    Alcohol use: No   Drug use: No   Sexual activity: Yes    Birth control/protection: None, Post-menopausal  Other Topics Concern   Not on file  Social History Narrative   Not on file   Social Determinants of Health   Financial Resource Strain: Low Risk  (09/27/2022)   Overall Financial Resource Strain (CARDIA)    Difficulty of Paying Living Expenses: Not hard at all  Food Insecurity: No Food Insecurity (09/27/2022)   Hunger Vital Sign    Worried About Running Out of Food in the Last Year: Never true    Ran Out of Food in the Last Year: Never true  Transportation Needs: No Transportation Needs (09/27/2022)   PRAPARE - Hydrologist (Medical): No    Lack of Transportation (Non-Medical): No  Physical Activity: Sufficiently Active (09/27/2022)   Exercise Vital Sign    Days of Exercise per Week: 3 days    Minutes of Exercise per Session: 60 min  Stress: No Stress Concern Present (09/27/2022)   [place]    Feeling of Stress : Not at all  Social Connections: [place] (09/27/2022)   Social Connection  and Isolation Panel [NHANES]    Frequency of Communication with Friends and Family: Three times a week    Frequency of Social Gatherings with Friends and Family: Once a week    Attends Religious Services: More than 4 times per year    Active Member of [employer] or Organizations: Yes    Attends Archivist Meetings: 1 to 4 times per year    Marital Status: Married   Family History  Problem Relation Age of Onset   Hypertension Mother    Hyperlipidemia Mother    Hypertension Maternal Aunt    Coronary artery disease Maternal Aunt    Hypertension Maternal Aunt    Coronary artery disease Maternal Aunt    Thyroid disease Paternal Grandmother    Hypertension Cousin    Outpatient Encounter Medications as of 10/09/2022  Medication Sig   amLODipine (NORVASC) 10 MG tablet Take 1  tablet (10 mg total) by mouth daily.   cloNIDine (CATAPRES - DOSED IN MG/24 HR) 0.2 mg/24hr patch Place 1 patch (0.2 mg total) onto the skin once a week. Change every Friday   ferrous sulfate 325 (65 FE) MG EC tablet Take 1 tablet (325 mg total) by mouth daily with breakfast.   furosemide (LASIX) 40 MG tablet Take 1 tablet (40 mg total) by mouth daily.   hydrALAZINE (APRESOLINE) 100 MG tablet Take 1 tablet (100 mg total) by mouth 3 (three) times daily.   isosorbide mononitrate (IMDUR) 60 MG 24 hr tablet Take 1 tablet (60 mg total) by mouth daily.   labetalol (NORMODYNE) 300 MG tablet Take 1 tablet (300 mg total) by mouth 2 (two) times daily. For BP   levothyroxine (SYNTHROID) 150 MCG tablet Take 1 tablet (150 mcg total) by mouth daily before breakfast.   Multiple Vitamin (MULTIVITAMIN ADULT PO) Take 1 tablet by mouth daily.   norethindrone (AYGESTIN) 5 MG tablet Take 1 tablet (5 mg total) by mouth daily.   omeprazole (PRILOSEC) 40 MG capsule Take 1 capsule (40 mg total) by mouth 2 (two) times daily for 14 days.   sacubitril-valsartan (ENTRESTO) 24-26 MG Take 1 tablet by mouth 2 (two) times daily.   vitamin B-12 (CYANOCOBALAMIN) 1000 MCG tablet Take 1 tablet (1,000 mcg total) by mouth daily.   Vitamin D, Ergocalciferol, (DRISDOL) 1.25 MG (50000 UNIT) CAPS capsule Take 50,000 Units by mouth every Wednesday.   [DISCONTINUED] levothyroxine (SYNTHROID) 175 MCG tablet Take 1 tablet (175 mcg total) by mouth daily before breakfast.   No facility-administered encounter medications on file as of 10/09/2022.   ALLERGIES: Allergies  Allergen Reactions   Ace Inhibitors Cough   Dust Mite Extract    Latex Itching   Pollen Extract     VACCINATION STATUS: Immunization History  Administered Date(s) Administered   Influenza,inj,Quad PF,6+ Mos 10/12/2020, 11/08/2021   PFIZER(Purple Top)SARS-COV-2 Vaccination 03/25/2020, 04/23/2020, 11/23/2020   Pneumococcal Polysaccharide-23 12/22/2020   Tdap 04/06/2015     HPI Tracy Joseph is 51 y.o. female who presents today with a medical history as above. 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  History is obtained directly from the patient as well as chart review.  According to her records, she underwent RAI thyroid ablation for hyperthyroidism in 2016.  She was given various doses of levothyroxine over the years.  She is currently on 175 mcg of levothyroxine daily. Her pre-visit labs are c/w over-replacement.  He does not have acute symptoms today.  She reports fluctuating body weight.  She denies dysphagia, shortness of breath, nor voice change.  She denies tremors, palpitation, normal heat intolerance.  Her medical history also includes hyperlipidemia with LDL of 140, not currently on treatment.  Review of Systems  Constitutional: + Fluctuating body weight, no fatigue, no subjective hyperthermia, no subjective hypothermia Eyes: no blurry vision, no xerophthalmia   Objective:       10/09/2022    8:44 AM 09/27/2022    8:47 AM 09/25/2022    9:25 AM  Vitals with BMI  Height '5\' 5"'$  '5\' 5"'$    Weight 222 lbs 3 oz 220 lbs   BMI 52.84 13.24   Systolic 401 027 253  Diastolic 96 664 96  Pulse 84 73     BP (!) 150/96   Pulse 84   Ht '5\' 5"'$  (1.651 m)   Wt 222 lb 3.2 oz (100.8 kg)   LMP 06/27/2022   BMI 36.98 kg/m   Wt Readings from Last 3 Encounters:  10/09/22 222 lb 3.2 oz (100.8 kg)  09/27/22 220 lb (99.8 kg)  09/25/22 221 lb 6.4 oz (100.4 kg)    Physical Exam  Constitutional:  Body mass index is 36.98 kg/m.,  not in acute distress, normal state of mind Eyes: PERRLA, EOMI, no exophthalmos   CMP ( most recent) CMP     Component Value Date/Time   NA 137 07/20/2022 1144   NA 141 05/17/2022 1023   K 3.6 07/20/2022 1144   CL 108 07/20/2022 1144   CO2 23 07/20/2022 1144   GLUCOSE 84 07/20/2022 1144   BUN 16 07/20/2022 1144   BUN 17 05/17/2022  1023   CREATININE 1.26 (H) 07/20/2022 1144   CREATININE 1.14 (H) 09/16/2019 0847   CALCIUM 9.1 07/20/2022 1144   PROT 7.9 07/20/2022 1144   PROT 7.2 03/08/2022 0930   ALBUMIN 4.0 07/20/2022 1144   ALBUMIN 4.2 03/08/2022 0930   AST 12 (L) 07/20/2022 1144   ALT 8 07/20/2022 1144   ALKPHOS 65 07/20/2022 1144   BILITOT 0.6 07/20/2022 1144   BILITOT 0.3 03/08/2022 0930   GFRNONAA 51 (L) 07/20/2022 1144   GFRNONAA 56 (L) 09/16/2019 0847   GFRAA 53 (L) 11/10/2020 0938   GFRAA 65 09/16/2019 0847     Diabetic Labs (most recent): Lab Results  Component Value Date   HGBA1C 6.1 03/08/2022   HGBA1C 5.9 04/26/2021   HGBA1C 6.1 (A) 10/12/2020     Lipid Panel ( most recent) Lipid Panel     Component Value Date/Time   CHOL 214 (H) 03/08/2022 0930   TRIG 135 03/08/2022 0930   HDL 50 03/08/2022 0930   CHOLHDL 4.3 03/08/2022 0930   CHOLHDL 4.7 09/16/2019 0847   VLDL 23 01/24/2017 0916   LDLCALC 140 (H) 03/08/2022 0930   LDLCALC 136 (H) 09/16/2019 0847   LABVLDL 24 03/08/2022 0930      Lab Results  Component Value Date   TSH 0.035 (L) 10/06/2022   TSH 2.640 05/17/2022   TSH 21.400 (H) 03/08/2022   TSH 1.460 04/26/2021   TSH 5.99 (H) 04/29/2019   TSH 5.25 (H) 07/05/2017   TSH 6.50 (H) 05/07/2017   TSH 10.37 (H) 01/24/2017   TSH 7.66 (H) 04/24/2016   TSH 13.323 (H) 12/15/2015   FREET4 2.15 (H) 10/06/2022   FREET4 0.31 (L) 04/06/2015      Assessment & Plan:   1. Hypothyroidism following radioiodine therapy 2. Prediabetes 3. Mixed hyperlipidemia  Her pre-visit thyroid function tests are  c/w over-replacement.   - We discussed about the correct intake of her thyroid hormone, on empty stomach at fasting, with water, separated by at least 30 minutes from breakfast and other medications,  and separated by more than 4 hours from calcium, iron, multivitamins, acid reflux medications (PPIs). -Patient is made aware of the fact that thyroid hormone replacement is needed for life,  dose to be adjusted by periodic monitoring of thyroid function tests.   Regarding her dyslipidemia will has prediabetes, hesitant to go on any statins.  She has not engaged with lifestyle medicine , but would like to try again.   - she acknowledges that there is a room for improvement in her food and drink choices. - Suggestion is made for her to avoid simple carbohydrates  from her diet including Cakes, Sweet Desserts, Ice Cream, Soda (diet and regular), Sweet Tea, Candies, Chips, Cookies, Store Bought Juices, Alcohol , Artificial Sweeteners,  Coffee Creamer, and "Sugar-free" Products, Lemonade. This will help patient to have more stable blood glucose profile and potentially avoid unintended weight gain.  The following Lifestyle Medicine recommendations according to [place]  [facility]) were discussed and and offered to patient and she  agrees to start the journey:  A. Whole Foods, Plant-Based Nutrition comprising of fruits and vegetables, plant-based proteins, whole-grain carbohydrates was discussed in detail with the patient.   A list for source of those nutrients were also provided to the patient.  Patient will use only water or unsweetened tea for hydration. B.  The need to stay away from risky substances including alcohol, smoking; obtaining 7 to 9 hours of restorative sleep, at least 150 minutes of moderate intensity exercise weekly, the importance of healthy social connections,  and stress management techniques were discussed. C.  A full color page of  Calorie density of various food groups per pound showing examples of each food groups was provided to the patient.   - she is advised to maintain close follow up with [name], MD for primary care needs.  I spent 32 minutes in the care of the patient today including review of labs from Thyroid Function, CMP, and other relevant labs ; imaging/biopsy records (current and previous including abstractions from  other facilities); face-to-face time discussing  her lab results and symptoms, medications doses, her options of short and long term treatment based on the latest standards of care / guidelines;   and documenting the encounter.  Ronney Lion  participated in the discussions, expressed understanding, and voiced agreement with the above plans.  All questions were answered to her satisfaction. she is encouraged to contact clinic should she have any questions or concerns prior to her return visit.   Follow up plan: Return in about 6 months (around 04/10/2023) for F/U with Pre-visit Labs, A1c -NV.   Glade Lloyd, MD [facility] [address] [place], [place] [address] Phone: [phone]  Fax: [phone]     10/09/2022, 6:58 PM  This note was partially dictated with voice recognition software. Similar sounding words can be transcribed inadequately or may not  be corrected upon review.

## 2022-10-25 ENCOUNTER — Ambulatory Visit (HOSPITAL_BASED_OUTPATIENT_CLINIC_OR_DEPARTMENT_OTHER): Payer: Managed Care, Other (non HMO)

## 2022-10-25 DIAGNOSIS — I1A0 Resistant hypertension: Secondary | ICD-10-CM

## 2022-10-26 ENCOUNTER — Ambulatory Visit
Payer: Managed Care, Other (non HMO) | Attending: Cardiovascular Disease | Admitting: Pharmacist Clinician (PhC)/ Clinical Pharmacy Specialist

## 2022-10-26 ENCOUNTER — Encounter: Payer: Self-pay | Admitting: Pharmacist Clinician (PhC)/ Clinical Pharmacy Specialist

## 2022-10-26 VITALS — BP 161/96 | HR 74

## 2022-10-26 DIAGNOSIS — I1A0 Resistant hypertension: Secondary | ICD-10-CM | POA: Diagnosis not present

## 2022-10-26 DIAGNOSIS — I5042 Chronic combined systolic (congestive) and diastolic (congestive) heart failure: Secondary | ICD-10-CM

## 2022-10-26 DIAGNOSIS — I10 Essential (primary) hypertension: Secondary | ICD-10-CM

## 2022-10-26 MED ORDER — SACUBITRIL-VALSARTAN 49-51 MG PO TABS: 1.0000 | ORAL_TABLET | Freq: Two times a day (BID) | ORAL | 3 refills | Status: DC

## 2022-10-26 NOTE — Assessment & Plan Note (Signed)
Patient with malignant hypertension as well as strong family history.  Renal dopplers negative for RAS.  Will increase Entresto to 49/51 mg twice daily and continue with all other medications.  She will get BMET in 2 weeks and I will see her back in 4 weeks for follow up.

## 2022-10-26 NOTE — Assessment & Plan Note (Signed)
Patient with HFmrEF at 45-50% by echo.  Currently on Entresto 24/26 and labetalol.  Reviewed information on GDMT and gave information regarding the medications.  At next visit, may add on SGLT2 or spironolactone, will see how her kidneys tolerate increase in Entresto dose first.

## 2022-10-26 NOTE — Progress Notes (Signed)
10/26/2022 [name] 1971-05-21 [id number]   HPI:  Tracy Joseph is a 51 y.o. female patient of Dr Oval Linsey, with a PMH below who presents today for advanced hypertension clinic follow up.  She was seen  by Dr. [name] just 2 years ago as a consult for malignant hypertension, and at that first visit had a pressure of 205/135, dropping to 200/110 in the office.  Because of worsening kidney function, he started her on labetalol 100 mg bid.  At follow up with [name] pressure was still at 200/130.  She was lost to follow up until last month, when she was referred from her PCP to the [facility].  Pressure at that time was still elevated, 179/96.  She was agreeable to join our RPM research and randomized to group 1.    Today she is in the office for her first follow up visit.  She notes that it has been around 20 years since she started having struggles with hypertension, and never has had it well controlled.  Several of her cousins also have the same issue, and they compare meds and discuss ways to keep pressure down.   She was started on Entresto 24/26 at last visit, due to EF being 45-50%.  She has tolerated this well, but took her last dose yesterday morning.  She normally takes her meds around 9 am, then mid-day hydralazine around 1 pm, and evening meds at 7 pm.  Of note she recently had levothyroxine dose decreased.     Past Medical History: hyperlipidemia 3/23 LDL 140, currently not on medication  CKD Stage 3b  CHF HFmrEF at 45-50%, on labetalol, Entresto; grade 2 diastolic dysfunction  DM2 3/23 A1c 6.1; was as high as 7.5 in 2020  OSA Study positive, has upcoming appointment  hypothyroid 10/23 TSH 0.035, T4 2.15; levothyroxine decreased to 150 mcg      Blood Pressure Goal:  130/80  Current Medications:   amlodipine 10 mg qd, clonidine 0.2 mg patch qWednesday, hydralazine 100 mg tid, labetalol 300 mg bid, Entresto 24/26 bid  Family Hx:  strong  family history of hypertension; all of her cousins are on 5+ meds, mother and aunts good with 1-2 meds; maternal GM died stroke; 2 aunts with CABG, father family more cancers; has 1 daughter, no diagnosed hypertension  Social Hx: no tobacco, only occasional alcohol; no soda in 2 weeks, does drink coffee when cold outside [employer])      Diet:  eats out except 1-2 times per week,  at home experimenting with Ms Deliah Boston and other seasonings    Exercise: [employer] three times weekly (45-60 min/visit)  Home BP readings:  160-170's, nothing as high as 180; lowest 242/35; diastolic all < 361    Intolerances:  Ace inhibitors - cough Amlodipine - LE edema Benicar - cough, dry, pruritis  Labs: 8/23:  Na 137, K 3.6, Glu 84, BUN 16, SCr 1.26, GFR 51   Wt Readings from Last 3 Encounters:  10/09/22 222 lb 3.2 oz (100.8 kg)  09/27/22 220 lb (99.8 kg)  09/25/22 221 lb 6.4 oz (100.4 kg)   BP Readings from Last 3 Encounters:  10/26/22 (!) 161/96  10/09/22 (!) 150/96  09/27/22 (!) 152/100   Pulse Readings from Last 3 Encounters:  10/26/22 74  10/09/22 84  09/27/22 73    Current Outpatient Medications  Medication Sig Dispense Refill   amLODipine (NORVASC) 10 MG tablet Take 1 tablet (10 mg total) by  mouth daily. 90 tablet 3   cloNIDine (CATAPRES - DOSED IN MG/24 HR) 0.2 mg/24hr patch Place 1 patch (0.2 mg total) onto the skin once a week. Change every Friday 4 patch 12   ferrous sulfate 325 (65 FE) MG EC tablet Take 1 tablet (325 mg total) by mouth daily with breakfast. 90 tablet 3   furosemide (LASIX) 40 MG tablet Take 1 tablet (40 mg total) by mouth daily. 90 tablet 3   hydrALAZINE (APRESOLINE) 100 MG tablet Take 1 tablet (100 mg total) by mouth 3 (three) times daily. 270 tablet 2   isosorbide mononitrate (IMDUR) 60 MG 24 hr tablet Take 1 tablet (60 mg total) by mouth daily. 90 tablet 3   labetalol (NORMODYNE) 300 MG tablet Take 1 tablet (300 mg total) by mouth 2 (two) times daily. For BP 180  tablet 3   levothyroxine (SYNTHROID) 150 MCG tablet Take 1 tablet (150 mcg total) by mouth daily before breakfast. 90 tablet 1   norethindrone (AYGESTIN) 5 MG tablet Take 1 tablet (5 mg total) by mouth daily. 90 tablet 4   omeprazole (PRILOSEC) 40 MG capsule Take 1 capsule (40 mg total) by mouth 2 (two) times daily for 14 days. 28 capsule 0   sacubitril-valsartan (ENTRESTO) 49-51 MG Take 1 tablet by mouth 2 (two) times daily. 60 tablet 3   vitamin B-12 (CYANOCOBALAMIN) 1000 MCG tablet Take 1 tablet (1,000 mcg total) by mouth daily. 30 tablet 3   Vitamin D, Ergocalciferol, (DRISDOL) 1.25 MG (50000 UNIT) CAPS capsule Take 50,000 Units by mouth every Wednesday.     No current facility-administered medications for this visit.    Allergies  Allergen Reactions   Ace Inhibitors Cough   Dust Mite Extract    Latex Itching   Pollen Extract     Past Medical History:  Diagnosis Date   Chronic combined systolic and diastolic heart failure ([place]) 05/04/2022   New dx in 04/2022 during hospitalization. Severe LVH, grade2 diastolic dysfunction, EF 45 to 50 %   Essential hypertension    Goiter    Initially hyperthyroid treated with RAI, now hypothyroid - follows with Dr. [name]   Hypertension    [employer] 10/12/2020   Hypothyroidism    Obstructive sleep apnea    Suspected    Prediabetes    Seasonal allergies    Tubular adenoma of colon     Blood pressure (!) 161/96, pulse 74, last menstrual period 06/27/2022.  HYPERTENSION CONTROL Vitals:   10/26/22 0826 10/26/22 0831  BP: (!) 164/102 (!) 161/96    The patient's blood pressure is elevated above target today.  In order to address the patient's elevated BP: A current anti-hypertensive medication was adjusted today.    Malignant hypertension Patient with malignant hypertension as well as strong family history.  Renal dopplers negative for RAS.  Will increase Entresto to 49/51 mg twice daily and continue with all other medications.  She will  get BMET in 2 weeks and I will see her back in 4 weeks for follow up.    Chronic combined systolic and diastolic heart failure ([place]) Patient with HFmrEF at 45-50% by echo.  Currently on Entresto 24/26 and labetalol.  Reviewed information on GDMT and gave information regarding the medications.  At next visit, may add on SGLT2 or spironolactone, will see how her kidneys tolerate increase in [place] dose first.     Tommy Medal PharmD CPP [facility] [place] [address] [place], [place] [address] [phone]

## 2022-10-26 NOTE — Patient Instructions (Signed)
Return for a a follow up appointment Wednesday Dec 13 at 8 am at [place] to the lab in 2 weeks (around Thanksgiving) to check kidney function  Check your blood pressure at home daily (if able) and keep record of the readings.  Take your BP meds as follows:  Increase Entresto to 49/51 mg twice daily.  Continue with all other medications  Bring all of your meds, your BP cuff and your record of home blood pressures to your next appointment.  Exercise as you're able, try to walk approximately 30 minutes per day.  Keep salt intake to a minimum, especially watch canned and prepared boxed foods.  Eat more fresh fruits and vegetables and fewer canned items.  Avoid eating in fast food restaurants.    HOW TO TAKE YOUR BLOOD PRESSURE: Rest 5 minutes before taking your blood pressure.  Don't smoke or drink caffeinated beverages for at least 30 minutes before. Take your blood pressure before (not after) you eat. Sit comfortably with your back supported and both feet on the floor (don't cross your legs). Elevate your arm to heart level on a table or a desk. Use the proper sized cuff. It should fit smoothly and snugly around your bare upper arm. There should be enough room to slip a fingertip under the cuff. The bottom edge of the cuff should be 1 inch above the crease of the elbow. Ideally, take 3 measurements at one sitting and record the average.

## 2022-10-27 ENCOUNTER — Ambulatory Visit (INDEPENDENT_AMBULATORY_CARE_PROVIDER_SITE_OTHER): Payer: Managed Care, Other (non HMO) | Admitting: Family Medicine

## 2022-10-27 ENCOUNTER — Encounter: Payer: Self-pay | Admitting: Family Medicine

## 2022-10-27 VITALS — BP 150/105 | HR 70 | Ht 65.0 in | Wt 220.0 lb

## 2022-10-27 DIAGNOSIS — I10 Essential (primary) hypertension: Secondary | ICD-10-CM | POA: Diagnosis not present

## 2022-10-27 DIAGNOSIS — Z1231 Encounter for screening mammogram for malignant neoplasm of breast: Secondary | ICD-10-CM

## 2022-10-27 DIAGNOSIS — K219 Gastro-esophageal reflux disease without esophagitis: Secondary | ICD-10-CM | POA: Insufficient documentation

## 2022-10-27 DIAGNOSIS — R0683 Snoring: Secondary | ICD-10-CM | POA: Diagnosis not present

## 2022-10-27 DIAGNOSIS — R7303 Prediabetes: Secondary | ICD-10-CM

## 2022-10-27 DIAGNOSIS — Z6836 Body mass index (BMI) 36.0-36.9, adult: Secondary | ICD-10-CM

## 2022-10-27 DIAGNOSIS — N920 Excessive and frequent menstruation with regular cycle: Secondary | ICD-10-CM

## 2022-10-27 MED ORDER — PANTOPRAZOLE SODIUM 40 MG PO TBEC: 40.0000 mg | DELAYED_RELEASE_TABLET | Freq: Two times a day (BID) | ORAL | 1 refills | Status: DC

## 2022-10-27 NOTE — Assessment & Plan Note (Signed)
Has seen gyne and is  on hormonal therapy which is helping

## 2022-10-27 NOTE — Assessment & Plan Note (Signed)
  Patient re-educated about  the importance of commitment to a  minimum of 150 minutes of exercise per week as able.  The importance of healthy food choices with portion control discussed, as well as eating regularly and within a 12 hour window most days. The need to choose "clean , green" food 50 to 75% of the time is discussed, as well as to make water the primary drink and set a goal of 64 ounces water daily.       10/27/2022    8:51 AM 10/09/2022    8:44 AM 09/27/2022    8:47 AM  Weight /BMI  Weight 220 lb 222 lb 3.2 oz 220 lb  Height '5\' 5"'$  (1.651 m) '5\' 5"'$  (1.651 m) '5\' 5"'$  (1.651 m)  BMI 36.61 kg/m2 36.98 kg/m2 36.61 kg/m2

## 2022-10-27 NOTE — Assessment & Plan Note (Signed)
Patient educated about the importance of limiting  Carbohydrate intake , the need to commit to daily physical activity for a minimum of 30 minutes , and to commit weight loss. The fact that changes in all these areas will reduce or eliminate all together the development of diabetes is stressed.      Latest Ref Rng & Units 07/20/2022   11:44 AM 05/17/2022   10:23 AM 05/07/2022    3:58 AM 05/06/2022    1:36 AM 05/05/2022    3:24 AM  Diabetic Labs  Creatinine 0.44 - 1.00 mg/dL 1.26  1.44  1.57  1.52  1.71       10/27/2022    9:19 AM 10/27/2022    8:57 AM 10/27/2022    8:51 AM 10/26/2022    8:31 AM 10/26/2022    8:26 AM 10/09/2022    8:44 AM 09/27/2022    8:47 AM  BP/Weight  Systolic BP 606 301 601 093 235 573 220  Diastolic BP 254 270 623 96 102 96 100  Wt. (Lbs)   220   222.2 220  BMI   36.61 kg/m2   36.98 kg/m2 36.61 kg/m2      Latest Ref Rng & Units 03/29/2021   12:00 AM 12/01/2020    8:00 AM  Foot/eye exam completion dates  Eye Exam No Retinopathy No Retinopathy       Foot Form Completion   Done     This result is from an external source.

## 2022-10-27 NOTE — Patient Instructions (Addendum)
F/U in 4 months, call if you need me sooner  Lipid, chem 7 and EGFr, HBA1C today   Please schedule pulmonary appointment at checkout if able  Please schedule mammogram at checkout  ([facility]) early morning  Stop caffeine, you are started on medication for reflux and are referred to GI   PLEASE call for flu vaccine and get Covid vaccine  It is important that you exercise regularly at least 30 minutes 5 times a week. If you develop chest pain, have severe difficulty breathing, or feel very tired, stop exercising immediately and seek medical attention   .Thanks for choosing [facility], we consider it a privelige to serve you.

## 2022-10-27 NOTE — Assessment & Plan Note (Signed)
Still not at goal ,but involved actively with management at [place] and commitment to daily physical activity for a minimum of 30 minutes discussed and encouraged, as a part of hypertension management. The importance of attaining a healthy weight is also discussed.     10/27/2022    9:19 AM 10/27/2022    8:57 AM 10/27/2022    8:51 AM 10/26/2022    8:31 AM 10/26/2022    8:26 AM 10/09/2022    8:44 AM 09/27/2022    8:47 AM  BP/Weight  Systolic BP 950 932 671 245 809 983 382  Diastolic BP 505 397 673 96 102 96 100  Wt. (Lbs)   220   222.2 220  BMI   36.61 kg/m2   36.98 kg/m2 36.61 kg/m2

## 2022-10-27 NOTE — Assessment & Plan Note (Signed)
Needs eval for OSA, referred

## 2022-10-27 NOTE — Progress Notes (Signed)
Tracy Joseph     MRN: [id number]      DOB: Nov 06, 1971   HPI Ms. Distel is here for follow up and re-evaluation of chronic medical conditions, medication management and review of any available recent lab and radiology data.  Preventive health is updated, specifically  Cancer screening and Immunization.   Questions or concerns regarding consultations or procedures which the PT has had in the interim are  addressed. involved [facility] and is compliant C/o 2 month h/o uncontrolled reflux symptoms which awaken her at least twice per week, needs to sleep propped up C/o excess snoring has needed sleep study now will pursue The PT denies any adverse reactions to current medications since the last visit.  Refusing vaccines ROS Denies recent fever or chills. Denies sinus pressure, nasal congestion, ear pain or sore throat. Denies chest congestion, productive cough or wheezing. Denies chest pains, palpitations and leg swelling Denies , vomiting,diarrhea or constipation.   Denies dysuria, frequency, hesitancy or incontinence. Denies joint pain, swelling and limitation in mobility. Denies headaches, seizures, numbness, or tingling. Denies depression, anxiety or insomnia. Denies skin break down or rash.c/o excess snoring , needs sleep study   PE  BP (!) 150/105   Pulse 70   Ht '5\' 5"'$  (1.651 m)   Wt 220 lb (99.8 kg)   LMP 10/18/2022 (Approximate)   BMI 36.61 kg/m   Patient alert and oriented and in no cardiopulmonary distress.  HEENT: No facial asymmetry, EOMI,     Neck supple .  Chest: Clear to auscultation bilaterally.  CVS: S1, S2 no murmurs, no S3.Regular rate.  ABD: Soft non tender.   Ext: No edema  MS: Adequate ROM spine, shoulders, hips and knees.  Skin: Intact, no ulcerations or rash noted.  Psych: Good eye contact, normal affect. Memory intact not anxious or depressed appearing.  CNS: CN 2-12 intact, power,  normal throughout.no focal deficits noted.   Assessment &  Plan  Malignant hypertension Still not at goal ,but involved actively with management at [facility] and commitment to daily physical activity for a minimum of 30 minutes discussed and encouraged, as a part of hypertension management. The importance of attaining a healthy weight is also discussed.     10/27/2022    9:19 AM 10/27/2022    8:57 AM 10/27/2022    8:51 AM 10/26/2022    8:31 AM 10/26/2022    8:26 AM 10/09/2022    8:44 AM 09/27/2022    8:47 AM  BP/Weight  Systolic BP 782 956 213 086 578 469 629  Diastolic BP 528 413 244 96 102 96 100  Wt. (Lbs)   220   222.2 220  BMI   36.61 kg/m2   36.98 kg/m2 36.61 kg/m2       Flooding during periods Has seen gyne and is  on hormonal therapy which is helping  Class 2 severe obesity due to excess calories with serious comorbidity and body mass index (BMI) of 36.0 to 36.9 in adult [facility])  Patient re-educated about  the importance of commitment to a  minimum of 150 minutes of exercise per week as able.  The importance of healthy food choices with portion control discussed, as well as eating regularly and within a 12 hour window most days. The need to choose "clean , green" food 50 to 75% of the time is discussed, as well as to make water the primary drink and set a goal of 64 ounces water daily.  10/27/2022    8:51 AM 10/09/2022    8:44 AM 09/27/2022    8:47 AM  Weight /BMI  Weight 220 lb 222 lb 3.2 oz 220 lb  Height '5\' 5"'$  (1.651 m) '5\' 5"'$  (1.651 m) '5\' 5"'$  (1.651 m)  BMI 36.61 kg/m2 36.98 kg/m2 36.61 kg/m2      GERD (gastroesophageal reflux disease) 2 month h/o uncontrolled symptoms awakening pt, start protonix, stop caffeine and GI eval  Snoring Needs eval for OSA, referred  Prediabetes Patient educated about the importance of limiting  Carbohydrate intake , the need to commit to daily physical activity for a minimum of 30 minutes , and to commit weight loss. The fact that changes in all these areas will reduce or  eliminate all together the development of diabetes is stressed.      Latest Ref Rng & Units 07/20/2022   11:44 AM 05/17/2022   10:23 AM 05/07/2022    3:58 AM 05/06/2022    1:36 AM 05/05/2022    3:24 AM  Diabetic Labs  Creatinine 0.44 - 1.00 mg/dL 1.26  1.44  1.57  1.52  1.71       10/27/2022    9:19 AM 10/27/2022    8:57 AM 10/27/2022    8:51 AM 10/26/2022    8:31 AM 10/26/2022    8:26 AM 10/09/2022    8:44 AM 09/27/2022    8:47 AM  BP/Weight  Systolic BP 161 096 [phone] 782  Diastolic BP 956 213 086 96 102 96 100  Wt. (Lbs)   220   222.2 220  BMI   36.61 kg/m2   36.98 kg/m2 36.61 kg/m2      Latest Ref Rng & Units 03/29/2021   12:00 AM 12/01/2020    8:00 AM  Foot/eye exam completion dates  Eye Exam No Retinopathy No Retinopathy       Foot Form Completion   Done     This result is from an external source.

## 2022-10-27 NOTE — Assessment & Plan Note (Signed)
2 month h/o uncontrolled symptoms awakening pt, start protonix, stop caffeine and GI eval

## 2022-11-06 ENCOUNTER — Other Ambulatory Visit: Payer: Self-pay | Admitting: Family Medicine

## 2022-11-06 ENCOUNTER — Encounter: Payer: Self-pay | Admitting: Internal Medicine

## 2022-11-07 LAB — HM DIABETES EYE EXAM

## 2022-11-21 ENCOUNTER — Telehealth: Payer: Self-pay

## 2022-11-21 NOTE — Telephone Encounter (Signed)
Called to discuss PREP class schedule at [facility], wants to attend next T/Th 12 noon class in December, either 12/19 or 12/26; will call back to confirm actual start date and set up assessment visit.

## 2022-12-01 ENCOUNTER — Ambulatory Visit
Payer: Managed Care, Other (non HMO) | Attending: Cardiovascular Disease | Admitting: Pharmacist Clinician (PhC)/ Clinical Pharmacy Specialist

## 2022-12-01 ENCOUNTER — Encounter: Payer: Self-pay | Admitting: Pharmacist Clinician (PhC)/ Clinical Pharmacy Specialist

## 2022-12-01 VITALS — BP 158/95 | HR 68 | Ht 65.0 in | Wt 212.0 lb

## 2022-12-01 DIAGNOSIS — I10 Essential (primary) hypertension: Secondary | ICD-10-CM

## 2022-12-01 DIAGNOSIS — I5042 Chronic combined systolic (congestive) and diastolic (congestive) heart failure: Secondary | ICD-10-CM

## 2022-12-01 NOTE — Assessment & Plan Note (Signed)
Will add spironolactone +/- Farxiga at next visit to maximize GDMT

## 2022-12-01 NOTE — Assessment & Plan Note (Signed)
Assessment: BP is uncontrolled in office BP 158/95 mmHg;  above the goal (<130/80). Has been eating out more with holiday parties/events Tolerates current medications well without any side effects  Denies SOB, palpitation, chest pain, headaches,or swelling   Plan:  Increase Entresto to 97/103 mg twice daily Continue taking all other medications Patient to keep record of BP readings with heart rate and report to [place] at the next visit Patient to follow up with PharmD in 4 weeks for follow up (ADV HTN GRP 1 #3) Labs ordered today:  BMET - early January (has order from PCP)

## 2022-12-01 NOTE — Patient Instructions (Signed)
Go to the lab in early January to check kidney function  Take your BP meds as follows:  Increase Entresto to 97/103 mg twice daily      (You can take 2 of the 49/51 mg tablets twice daily until they are gone)  Continue with all your other medication  Check your blood pressure at home daily  and keep record of the readings.  Hypertension "High blood pressure"  Hypertension is often called "The Silent Killer." It rarely causes symptoms until it is extremely  high or has done damage to other organs in the body. For this reason, you should have your  blood pressure checked regularly by your physician. We will check your blood pressure  every time you see a provider at one of our offices.   Your blood pressure reading consists of two numbers. Ideally, blood pressure should be  below 120/80. The first ("top") number is called the systolic pressure. It measures the  pressure in your arteries as your heart beats. The second ("bottom") number is called the diastolic pressure. It measures the pressure in your arteries as the heart relaxes between beats.  The benefits of getting your blood pressure under control are enormous. A 10-point  reduction in systolic blood pressure can reduce your risk of stroke by 27% and heart failure by 28%  Your blood pressure goal is < 130/80  To check your pressure at home you will need to:  1. Sit up in a chair, with feet flat on the floor and back supported. Do not cross your ankles or legs. 2. Rest your left arm so that the cuff is about heart level. If the cuff goes on your upper arm,  then just relax the arm on the table, arm of the chair or your lap. If you have a wrist cuff, we  suggest relaxing your wrist against your chest (think of it as Pledging the Flag with the  wrong arm).  3. Place the cuff snugly around your arm, about 1 inch above the crook of your elbow. The  cords should be inside the groove of your elbow.  4. Sit quietly, with the cuff in  place, for about 5 minutes. After that 5 minutes press the power  button to start a reading. 5. Do not talk or move while the reading is taking place.  6. Record your readings on a sheet of paper. Although most cuffs have a memory, it is often  easier to see a pattern developing when the numbers are all in front of you.  7. You can repeat the reading after 1-3 minutes if it is recommended  Make sure your bladder is empty and you have not had caffeine or tobacco within the last 30 min  Always bring your blood pressure log with you to your appointments. If you have not brought your monitor in to be double checked for accuracy, please bring it to your next appointment.  You can find a list of quality blood pressure cuffs at [email or web address]

## 2022-12-01 NOTE — Progress Notes (Signed)
12/01/2022 Tracy Joseph Tracy Joseph 1971/08/28 [id number]   HPI:  Tracy Joseph is a 51 y.o. female patient of Dr Oval Linsey, with a PMH below who presents today for advanced hypertension clinic follow up.  She was seen  by Dr. [name] just 2 years ago as a consult for malignant hypertension, and at that first visit had a pressure of 205/135, dropping to 200/110 in the office.  Because of worsening kidney function, he started her on labetalol 100 mg bid.  At follow up with [name] pressure was still at 200/130.  She was lost to follow up until last month, when she was referred from her PCP to the [facility].  Pressure at that time was still elevated, 179/96.  She was agreeable to join our RPM research and randomized to group 1.  At her first visit with me we increased the Entresto to 49/51 mg bid.  She was asked to get labs after 2 weeks, but has not yet done that.     Today she returns for follow up.  States no problems with the dose increase of Entresto to 49/51 mg.  She has been busy over the past few weeks with Christmas program practices and holiday parties, and therefore has been eating out more than normal for her.  She also has not been able to check home BP readings as often, as she is more on the go.  Did not get labs done last month, but has scheduled labs for PCP in early January, including BMET.    Past Medical History: hyperlipidemia 3/23 LDL 140, currently not on medication  CKD Stage 3b  CHF HFmrEF at 45-50%, on labetalol, Entresto; grade 2 diastolic dysfunction  DM2 3/23 A1c 6.1; was as high as 7.5 in 2020  OSA Study positive, has upcoming appointment  hypothyroid 10/23 TSH 0.035, T4 2.15; levothyroxine decreased to 150 mcg      Blood Pressure Goal:  130/80  Current Medications:   amlodipine 10 mg qd, clonidine 0.2 mg patch qWednesday, hydralazine 100 mg tid, labetalol 300 mg bid, Entresto 49/51 bid  Family Hx:  strong family history of hypertension;  all of her cousins are on 5+ meds, mother and aunts good with 1-2 meds; maternal GM died stroke; 2 aunts with CABG, father family more cancers; has 1 daughter, no diagnosed hypertension  Social Hx: no tobacco, only occasional alcohol; no soda in 2 weeks, does drink coffee when cold outside [employer])      Diet:  eats out except 1-2 times per week,  at home experimenting with Ms Deliah Boston and other seasonings    Exercise: [employer] three times weekly (45-60 min/visit)  Home BP readings:  7 readings over the past month, average 159/95.    Intolerances:  Ace inhibitors - cough Amlodipine - LE edema Benicar - cough, dry, pruritis  Labs: 8/23:  Na 137, K 3.6, Glu 84, BUN 16, SCr 1.26, GFR 51   Wt Readings from Last 3 Encounters:  12/01/22 212 lb (96.2 kg)  10/27/22 220 lb (99.8 kg)  10/09/22 222 lb 3.2 oz (100.8 kg)   BP Readings from Last 3 Encounters:  12/01/22 (!) 158/95  10/27/22 (!) 150/105  10/26/22 (!) 161/96   Pulse Readings from Last 3 Encounters:  12/01/22 68  10/27/22 70  10/26/22 74    Current Outpatient Medications  Medication Sig Dispense Refill   amLODipine (NORVASC) 10 MG tablet Take 1 tablet (10 mg total) by mouth daily. 90 tablet  3   cloNIDine (CATAPRES - DOSED IN MG/24 HR) 0.2 mg/24hr patch Place 1 patch (0.2 mg total) onto the skin once a week. Change every Friday 4 patch 12   ferrous sulfate 325 (65 FE) MG EC tablet Take 1 tablet (325 mg total) by mouth daily with breakfast. 90 tablet 3   furosemide (LASIX) 40 MG tablet Take 1 tablet (40 mg total) by mouth daily. 90 tablet 3   hydrALAZINE (APRESOLINE) 100 MG tablet Take 1 tablet (100 mg total) by mouth 3 (three) times daily. 270 tablet 2   isosorbide mononitrate (IMDUR) 60 MG 24 hr tablet Take 1 tablet (60 mg total) by mouth daily. 90 tablet 3   labetalol (NORMODYNE) 300 MG tablet Take 1 tablet (300 mg total) by mouth 2 (two) times daily. For BP 180 tablet 3   levothyroxine (SYNTHROID) 150 MCG tablet Take 1  tablet (150 mcg total) by mouth daily before breakfast. 90 tablet 1   norethindrone (AYGESTIN) 5 MG tablet Take 1 tablet (5 mg total) by mouth daily. 90 tablet 4   pantoprazole (PROTONIX) 40 MG tablet TAKE 1 TABLET (40 MG TOTAL) BY MOUTH TWICE A DAY BEFORE MEALS 180 tablet 1   sacubitril-valsartan (ENTRESTO) 49-51 MG Take 1 tablet by mouth 2 (two) times daily. 60 tablet 3   vitamin B-12 (CYANOCOBALAMIN) 1000 MCG tablet Take 1 tablet (1,000 mcg total) by mouth daily. 30 tablet 3   Vitamin D, Ergocalciferol, (DRISDOL) 1.25 MG (50000 UNIT) CAPS capsule Take 50,000 Units by mouth every Wednesday.     No current facility-administered medications for this visit.    Allergies  Allergen Reactions   Ace Inhibitors Cough   Dust Mite Extract    Latex Itching   Pollen Extract     Past Medical History:  Diagnosis Date   Chronic combined systolic and diastolic heart failure ([place]) 05/04/2022   New dx in 04/2022 during hospitalization. Severe LVH, grade2 diastolic dysfunction, EF 45 to 50 %   Essential hypertension    Goiter    Initially hyperthyroid treated with RAI, now hypothyroid - follows with Dr. [name]   Hypertension    Phreesia 10/12/2020   Hypothyroidism    Obstructive sleep apnea    Suspected    Prediabetes    Seasonal allergies    Tubular adenoma of colon     Blood pressure (!) 158/95, pulse 68, height '5\' 5"'$  (1.651 m), weight 212 lb (96.2 kg).  HYPERTENSION CONTROL Vitals:   12/01/22 0828 12/01/22 0834  BP: (!) 159/99 (!) 158/95    The patient's blood pressure is elevated above target today.  In order to address the patient's elevated BP: A current anti-hypertensive medication was adjusted today.     Malignant hypertension Assessment: BP is uncontrolled in office BP 158/95 mmHg;  above the goal (<130/80). Has been eating out more with holiday parties/events Tolerates current medications well without any side effects  Denies SOB, palpitation, chest pain, headaches,or  swelling   Plan:  Increase Entresto to 97/103 mg twice daily Continue taking all other medications Patient to keep record of BP readings with heart rate and report to [place] at the next visit Patient to follow up with PharmD in 4 weeks for follow up (ADV HTN GRP 1 #3) Labs ordered today:  BMET - early January (has order from PCP)   Chronic combined systolic and diastolic heart failure ([place]) Will add spironolactone +/- Farxiga at next visit to maximize [place] PharmD CPP [facility]  [facility] [address] [place], [place] [address] [phone]

## 2022-12-06 ENCOUNTER — Telehealth: Payer: Self-pay

## 2022-12-06 NOTE — Telephone Encounter (Signed)
Called to confirm attending next PREP class at [facility], assessment visit scheduled for 12/27 at 9am.

## 2022-12-13 ENCOUNTER — Inpatient Hospital Stay: Payer: Managed Care, Other (non HMO) | Attending: Hematology

## 2022-12-13 ENCOUNTER — Telehealth: Payer: Self-pay

## 2022-12-13 DIAGNOSIS — R768 Other specified abnormal immunological findings in serum: Secondary | ICD-10-CM

## 2022-12-13 DIAGNOSIS — D509 Iron deficiency anemia, unspecified: Secondary | ICD-10-CM | POA: Insufficient documentation

## 2022-12-13 DIAGNOSIS — N1832 Chronic kidney disease, stage 3b: Secondary | ICD-10-CM

## 2022-12-13 DIAGNOSIS — E538 Deficiency of other specified B group vitamins: Secondary | ICD-10-CM | POA: Diagnosis not present

## 2022-12-13 DIAGNOSIS — D500 Iron deficiency anemia secondary to blood loss (chronic): Secondary | ICD-10-CM

## 2022-12-13 DIAGNOSIS — R779 Abnormality of plasma protein, unspecified: Secondary | ICD-10-CM | POA: Insufficient documentation

## 2022-12-13 DIAGNOSIS — N183 Chronic kidney disease, stage 3 unspecified: Secondary | ICD-10-CM | POA: Insufficient documentation

## 2022-12-13 LAB — CBC WITH DIFFERENTIAL/PLATELET
Abs Immature Granulocytes: 0.01 10*3/uL (ref 0.00–0.07)
Basophils Absolute: 0 10*3/uL (ref 0.0–0.1)
Basophils Relative: 0 %
Eosinophils Absolute: 0.1 10*3/uL (ref 0.0–0.5)
Eosinophils Relative: 1 %
HCT: 41.7 % (ref 36.0–46.0)
Hemoglobin: 14 g/dL (ref 12.0–15.0)
Immature Granulocytes: 0 %
Lymphocytes Relative: 22 %
Lymphs Abs: 1.3 10*3/uL (ref 0.7–4.0)
MCH: 33.3 pg (ref 26.0–34.0)
MCHC: 33.6 g/dL (ref 30.0–36.0)
MCV: 99.3 fL (ref 80.0–100.0)
Monocytes Absolute: 0.6 10*3/uL (ref 0.1–1.0)
Monocytes Relative: 10 %
Neutro Abs: 3.9 10*3/uL (ref 1.7–7.7)
Neutrophils Relative %: 67 %
Platelets: 259 10*3/uL (ref 150–400)
RBC: 4.2 MIL/uL (ref 3.87–5.11)
RDW: 13.8 % (ref 11.5–15.5)
WBC: 5.9 10*3/uL (ref 4.0–10.5)
nRBC: 0 % (ref 0.0–0.2)

## 2022-12-13 LAB — IRON AND TIBC
Iron: 89 ug/dL (ref 28–170)
Saturation Ratios: 25 % (ref 10.4–31.8)
TIBC: 361 ug/dL (ref 250–450)
UIBC: 272 ug/dL

## 2022-12-13 LAB — FERRITIN: Ferritin: 20 ng/mL (ref 11–307)

## 2022-12-13 LAB — FOLATE: Folate: 7.3 ng/mL (ref >5.9)

## 2022-12-13 LAB — VITAMIN B12: Vitamin B-12: 303 pg/mL (ref 180–914)

## 2022-12-13 NOTE — Telephone Encounter (Signed)
Received text message with cancellation for assessment visit tomorrow, pt has URI; she would like to postpone attending PREP next Tuesday and look at other dates. Offered her next class on 01/15/23, every M/W 12:30-1:45

## 2022-12-14 LAB — KAPPA/LAMBDA LIGHT CHAINS
Kappa free light chain: 32.8 mg/L — ABNORMAL HIGH (ref 3.3–19.4)
Kappa, lambda light chain ratio: 2.25 — ABNORMAL HIGH (ref 0.26–1.65)
Lambda free light chains: 14.6 mg/L (ref 5.7–26.3)

## 2022-12-15 LAB — METHYLMALONIC ACID, SERUM: Methylmalonic Acid, Quantitative: 133 nmol/L (ref 0–378)

## 2022-12-19 LAB — IMMUNOFIXATION ELECTROPHORESIS
IgA: 165 mg/dL (ref 87–352)
IgG (Immunoglobin G), Serum: 1588 mg/dL (ref 586–1602)
IgM (Immunoglobulin M), Srm: 47 mg/dL (ref 26–217)
Total Protein ELP: 7 g/dL (ref 6.0–8.5)

## 2022-12-19 NOTE — Progress Notes (Signed)
Virtual Visit via Telephone Note [facility]  I connected with Tracy Joseph  on 12/20/2022 at 12:00 PM by telephone and verified that I am speaking with the correct person using two identifiers.  Location: Patient: Home Provider: [facility]   I discussed the limitations, risks, security and privacy concerns of performing an evaluation and management service by telephone and the availability of in person appointments. I also discussed with the patient that there may be a patient responsible charge related to this service. The patient expressed understanding and agreed to proceed.  REASON FOR VISIT:  Follow-up for iron deficiency anemia  CURRENT THERAPY: IV iron  INTERVAL HISTORY:  Tracy Joseph 52 y.o. female returns for routine follow-up of her iron deficiency anemia.  She was last seen by Tarri Abernethy PA-C on 09/07/2022.  At today's visit, she reports feeling well.  No recent hospitalizations, surgeries, or changes in baseline health status.  She currently reports her energy is good.  She reports moderate monthly periods with irregular spotting in between periods; she thinks she may be perimenopausal.  No rectal bleeding or melena.  She stopped taking her multivitamin but continues to take daily iron and B12, as well as weekly vitamin D.  She has 100% energy and 100% appetite. She endorses that she is maintaining a stable weight.    OBSERVATIONS/OBJECTIVE: Review of Systems  Constitutional:  Negative for chills, diaphoresis and fever.  HENT:  Negative for nosebleeds.   Respiratory:  Negative for cough, hemoptysis and shortness of breath.   Cardiovascular:  Negative for chest pain and palpitations.  Gastrointestinal:  Negative for abdominal pain, blood in stool, constipation, diarrhea, nausea and vomiting.  Genitourinary:  Negative for hematuria.  Skin: Negative.   Neurological:  Negative for dizziness and headaches.  Endo/Heme/Allergies:  Does not  bruise/bleed easily.    PHYSICAL EXAM (per limitations of virtual telephone visit): The patient is alert and oriented x 3, exhibiting adequate mentation, good mood, and ability to speak in full sentences and execute sound judgement.   ASSESSMENT & PLAN: 1.  Iron deficiency anemia - Seen at the request of Dr. Moshe Cipro for severe microcytic anemia from iron deficiency and CKD stage IIIa/b - EGD/colonoscopy (07/25/2022): Erythematous mucosa in stomach, polyps and diverticulosis in colon.  No obvious source of bleeding. - Labs from 05/04/2022 showed severely low ferritin 3 with Hgb 8.3 - Additional hematology work-up (05/26/2022) was unremarkable: CMP with baseline CKD stage IIIa/b (creatinine 1.26/GFR 51) Low reticulocytes 1.6%.  Normal LDH. Normal B12, MMA, folate, copper  Elevated free light chains discussed below  - She has had heavy menses every 14 to 18 days for the last several years, but they are starting to become more irregular and lighter over the last few months.  Currently reports moderate menstrual period once a month with light spotting between periods. - Denies any bright blood per rectum or melena. - She has been taking iron tablet since May 2023 - She received IV Venofer x3 in June/July 2023 - Most recent labs (05/13/2022): Normal CBC with Hgb 14.0, ferritin 20, iron saturation 25% - PLAN: Anemia has resolved and she is asymptomatic.  Iron levels are improving on oral iron alone. - Continue daily ferrous sulfate.  - Labs in 6 months with PHONE visit after  2.  Folic acid & vitamin N23 deficiency - Most recent labs (12/13/2022): Vitamin B12 303, normal MMA, folate 7.3 - PLAN: Continue daily vitamin B12 and restart her daily multivitamin.  We will check labs at follow-up visit.    3.  Elevated free light chains - Workup of anemia (05/26/2022) revealed elevated kappa light chains 43.2, normal lambda, elevated ratio 2.12.  Normal SPEP. - Immunofixation (12/13/2022) unremarkable. -  PLAN: Suspect elevated free light chains related to CKD.  No further workup.  4.  Social/family history: - Works at [employer] at [employer].  Non-smoker. - No family history of anemia or malignancy.   PLAN SUMMARY: >> Labs in 6 months (CBC/D, CMP, ferritin, iron/TIBC, B12, folate, MMA) >> PHONE visit after labs    I discussed the assessment and treatment plan with the patient. The patient was provided an opportunity to ask questions and all were answered. The patient agreed with the plan and demonstrated an understanding of the instructions.   The patient was advised to call back or seek an in-person evaluation if the symptoms worsen or if the condition fails to improve as anticipated.  I provided 15 minutes of non-face-to-face time during this encounter.  Harriett Rush, PA-C 12/20/22 1:52 PM

## 2022-12-20 ENCOUNTER — Encounter: Payer: Self-pay | Admitting: Physician Assistant

## 2022-12-20 ENCOUNTER — Ambulatory Visit (INDEPENDENT_AMBULATORY_CARE_PROVIDER_SITE_OTHER): Payer: Managed Care, Other (non HMO) | Admitting: Internal Medicine

## 2022-12-20 ENCOUNTER — Encounter: Payer: Self-pay | Admitting: Internal Medicine

## 2022-12-20 ENCOUNTER — Inpatient Hospital Stay: Payer: Managed Care, Other (non HMO) | Attending: Physician Assistant | Admitting: Physician Assistant

## 2022-12-20 VITALS — BP 143/76 | HR 85 | Temp 97.3°F | Ht 65.0 in | Wt 224.0 lb

## 2022-12-20 DIAGNOSIS — R768 Other specified abnormal immunological findings in serum: Secondary | ICD-10-CM

## 2022-12-20 DIAGNOSIS — N1832 Chronic kidney disease, stage 3b: Secondary | ICD-10-CM | POA: Diagnosis not present

## 2022-12-20 DIAGNOSIS — K219 Gastro-esophageal reflux disease without esophagitis: Secondary | ICD-10-CM

## 2022-12-20 DIAGNOSIS — D500 Iron deficiency anemia secondary to blood loss (chronic): Secondary | ICD-10-CM | POA: Diagnosis not present

## 2022-12-20 DIAGNOSIS — E538 Deficiency of other specified B group vitamins: Secondary | ICD-10-CM | POA: Diagnosis not present

## 2022-12-20 DIAGNOSIS — K2970 Gastritis, unspecified, without bleeding: Secondary | ICD-10-CM

## 2022-12-20 DIAGNOSIS — B9681 Helicobacter pylori [H. pylori] as the cause of diseases classified elsewhere: Secondary | ICD-10-CM | POA: Diagnosis not present

## 2022-12-20 NOTE — Patient Instructions (Signed)
We need to check to make sure that the H. pylori infection has been eradicated.  I will order a breath test to be done at [facility].  You will need to hold your pantoprazole for at least 10 days prior to testing.  Also do not take Pepto-Bismol during this time.  You can take over-the-counter Pepcid twice a day.  After you complete the breath test, you can immediately go back on your pantoprazole.  We will call with these results.    Follow-up in 3 months.  It was very nice meeting you today.  Dr. Abbey Chatters

## 2022-12-20 NOTE — Progress Notes (Signed)
Referring Provider: [name], MD Primary Care Physician:  [name], MD Primary GI:  Dr. Abbey Chatters  Chief Complaint  Patient presents with   Follow-up    Patient here today for a follow up on her Tracy Joseph. Patient states symptoms are doing well while on pantoprazole 40 mg bid.    HPI:   Tracy Joseph is a 52 y.o. female who presents to clinic today for follow-up visit.  History of positive hepatitis C antibody.  Subsequent viral load undetectable.  History of chronic GERD on pantoprazole 40 mg twice daily.  Underwent EGD 07/25/2022 for GERD and iron deficiency anemia.  Found to have gastritis, biopsies positive for H. pylori.  Completed 14-day course of quad therapy.  Has not undergone eradication testing as of yet.  Colonoscopy 07/25/2022 with 1 small polyp removed, benign pathology.  Today states she is doing okay.  Occasional breakthrough reflux symptoms and belching.  Some epigastric discomfort at times which is mild.  Overall symptoms tend to be relatively well-controlled on pantoprazole 40 mg twice daily.  No dysphagia odynophagia.    Past Medical History:  Diagnosis Date   Chronic combined systolic and diastolic heart failure ([name]) 05/04/2022   New dx in 04/2022 during hospitalization. Severe LVH, grade2 diastolic dysfunction, EF 45 to 50 %   Essential hypertension    Goiter    Initially hyperthyroid treated with RAI, now hypothyroid - follows with Dr. [name]   Hypertension    Phreesia 10/12/2020   Hypothyroidism    Obstructive sleep apnea    Suspected    Prediabetes    Seasonal allergies    Tubular adenoma of colon     Past Surgical History:  Procedure Laterality Date   BIOPSY  07/25/2022   Procedure: BIOPSY;  Surgeon: Harvel Quale, MD;  [facility]: [facility];  Service: Gastroenterology;;   COLONOSCOPY N/A 05/03/2017   Procedure: COLONOSCOPY;  Surgeon: Rogene Houston, MD;  [facility]: [facility];  Service: Endoscopy;  Laterality:  N/A;  930   COLONOSCOPY WITH PROPOFOL N/A 07/25/2022   Procedure: COLONOSCOPY WITH PROPOFOL;  Surgeon: Harvel Quale, MD;  [facility]: [facility];  Service: Gastroenterology;  Laterality: N/A;  1130 am   ESOPHAGOGASTRODUODENOSCOPY (EGD) WITH PROPOFOL N/A 07/25/2022   Procedure: ESOPHAGOGASTRODUODENOSCOPY (EGD) WITH PROPOFOL;  Surgeon: Harvel Quale, MD;  Location: [facility];  Service: Gastroenterology;  Laterality: N/A;   TUBAL LIGATION  2005    Current Outpatient Medications  Medication Sig Dispense Refill   amLODipine (NORVASC) 10 MG tablet Take 1 tablet (10 mg total) by mouth daily. 90 tablet 3   cloNIDine (CATAPRES - DOSED IN MG/24 HR) 0.2 mg/24hr patch Place 1 patch (0.2 mg total) onto the skin once a week. Change every Friday 4 patch 12   ferrous sulfate 325 (65 FE) MG EC tablet Take 1 tablet (325 mg total) by mouth daily with breakfast. 90 tablet 3   furosemide (LASIX) 40 MG tablet Take 1 tablet (40 mg total) by mouth daily. 90 tablet 3   hydrALAZINE (APRESOLINE) 100 MG tablet Take 1 tablet (100 mg total) by mouth 3 (three) times daily. 270 tablet 2   isosorbide mononitrate (IMDUR) 60 MG 24 hr tablet Take 1 tablet (60 mg total) by mouth daily. 90 tablet 3   labetalol (NORMODYNE) 300 MG tablet Take 1 tablet (300 mg total) by mouth 2 (two) times daily. For BP 180 tablet 3   levothyroxine (SYNTHROID) 150 MCG tablet Take 1  tablet (150 mcg total) by mouth daily before breakfast. 90 tablet 1   norethindrone (AYGESTIN) 5 MG tablet Take 1 tablet (5 mg total) by mouth daily. 90 tablet 4   pantoprazole (PROTONIX) 40 MG tablet TAKE 1 TABLET (40 MG TOTAL) BY MOUTH TWICE A DAY BEFORE MEALS 180 tablet 1   sacubitril-valsartan (ENTRESTO) 49-51 MG Take 1 tablet by mouth 2 (two) times daily. 60 tablet 3   vitamin B-12 (CYANOCOBALAMIN) 1000 MCG tablet Take 1 tablet (1,000 mcg total) by mouth daily. 30 tablet 3   Vitamin D, Ergocalciferol, (DRISDOL) 1.25 MG (50000 UNIT) CAPS  capsule Take 50,000 Units by mouth every Wednesday.     No current facility-administered medications for this visit.    Allergies as of 12/20/2022 - Review Complete 12/20/2022  Allergen Reaction Noted   Ace inhibitors Cough 10/14/2013   Dust mite extract  02/22/2011   Latex Itching 07/12/2022   Pollen extract  02/22/2011    Family History  Problem Relation Age of Onset   Hypertension Mother    Hyperlipidemia Mother    Hypertension Maternal Aunt    Coronary artery disease Maternal Aunt    Hypertension Maternal Aunt    Coronary artery disease Maternal Aunt    Thyroid disease Paternal Grandmother    Hypertension Cousin     Social History   Socioeconomic History   Marital status: Married    Spouse name: Not on file   Number of children: 1   Years of education: Not on file   Highest education level: Not on file  Occupational History   Occupation: full time [employer] broker for [employer]  Tobacco Use   Smoking status: Never   Smokeless tobacco: Never  Vaping Use   Vaping Use: Never used  Substance and Sexual Activity   Alcohol use: No   Drug use: No   Sexual activity: Yes    Birth control/protection: None, Post-menopausal  Other Topics Concern   Not on file  Social History Narrative   Not on file   Social Determinants of Health   Financial Resource Strain: Low Risk  (09/27/2022)   Overall Financial Resource Strain (CARDIA)    Difficulty of Paying Living Expenses: Not hard at all  Food Insecurity: No Food Insecurity (09/27/2022)   Hunger Vital Sign    Worried About Running Out of Food in the Last Year: Never true    [place] in the Last Year: Never true  Transportation Needs: No Transportation Needs (09/27/2022)   PRAPARE - Hydrologist (Medical): No    Lack of Transportation (Non-Medical): No  Physical Activity: Sufficiently Active (09/27/2022)   Exercise Vital Sign    Days of Exercise per Week: 3 days    Minutes of Exercise per Session:  60 min  Stress: No Stress Concern Present (09/27/2022)   [place]    Feeling of Stress : Not at all  Social Connections: [place] (09/27/2022)   Social Connection and Isolation Panel [NHANES]    Frequency of Communication with Friends and Family: Three times a week    Frequency of Social Gatherings with Friends and Family: Once a week    Attends Religious Services: More than 4 times per year    Active Member of [employer] or Organizations: Yes    Attends Archivist Meetings: 1 to 4 times per year    Marital Status: Married    Subjective: Review of Systems  Constitutional:  Negative for chills and fever.  HENT:  Negative for congestion and hearing loss.   Eyes:  Negative for blurred vision and double vision.  Respiratory:  Negative for cough and shortness of breath.   Cardiovascular:  Negative for chest pain and palpitations.  Gastrointestinal:  Positive for abdominal pain and heartburn. Negative for blood in stool, constipation, diarrhea, melena and vomiting.  Genitourinary:  Negative for dysuria and urgency.  Musculoskeletal:  Negative for joint pain and myalgias.  Skin:  Negative for itching and rash.  Neurological:  Negative for dizziness and headaches.  Psychiatric/Behavioral:  Negative for depression. The patient is not nervous/anxious.      Objective: BP (!) 143/76 (BP Location: Left Arm, Patient Position: Sitting, Cuff Size: Large)   Pulse 85   Temp (!) 97.3 F (36.3 C) (Temporal)   Ht '5\' 5"'$  (1.651 m)   Wt 224 lb (101.6 kg)   BMI 37.28 kg/m  Physical Exam Constitutional:      Appearance: Normal appearance.  HENT:     Head: Normocephalic and atraumatic.  Eyes:     Extraocular Movements: Extraocular movements intact.     Conjunctiva/sclera: Conjunctivae normal.  Cardiovascular:     Rate and Rhythm: Normal rate and regular rhythm.  Pulmonary:     Effort: Pulmonary effort is  normal.     Breath sounds: Normal breath sounds.  Abdominal:     General: Bowel sounds are normal.     Palpations: Abdomen is soft.  Musculoskeletal:        General: No swelling. Normal range of motion.     Cervical back: Normal range of motion and neck supple.  Skin:    General: Skin is warm and dry.     Coloration: Skin is not jaundiced.  Neurological:     General: No focal deficit present.     Mental Status: She is alert and oriented to person, place, and time.  Psychiatric:        Mood and Affect: Mood normal.        Behavior: Behavior normal.      Assessment: *H. pylori gastritis *Chronic GERD *Epigastric discomfort  Plan: Will need to check eradication of patient's H. pylori gastritis status post 14-day course of quad therapy.  Discussed stool antigen versus breath test and patient would like to have breath test performed.  I will order today to be [facility] at [facility].  Patient will need to hold pantoprazole for at least 10 days prior to undergoing this.  No Pepto-Bismol either.  Follow-up in 3 months.  12/20/2022 3:49 PM   Disclaimer: This note was dictated with voice recognition software. Similar sounding words can inadvertently be transcribed and may not be corrected upon review.

## 2022-12-21 ENCOUNTER — Ambulatory Visit: Payer: Managed Care, Other (non HMO) | Admitting: Internal Medicine

## 2022-12-27 ENCOUNTER — Encounter: Payer: Self-pay | Admitting: Pulmonary Disease

## 2022-12-27 ENCOUNTER — Ambulatory Visit (INDEPENDENT_AMBULATORY_CARE_PROVIDER_SITE_OTHER): Payer: Managed Care, Other (non HMO) | Admitting: Pulmonary Disease

## 2022-12-27 VITALS — BP 144/90 | HR 74 | Temp 98.4°F | Ht 65.0 in | Wt 222.2 lb

## 2022-12-27 DIAGNOSIS — R0683 Snoring: Secondary | ICD-10-CM | POA: Diagnosis not present

## 2022-12-27 NOTE — Progress Notes (Signed)
[facility]  Chief Complaint  Patient presents with   Consult    Sleep consult- snoring     Past Surgical History:  She  has a past surgical history that includes Tubal ligation (2005); Colonoscopy (N/A, 05/03/2017); Colonoscopy with propofol (N/A, 07/25/2022); Esophagogastroduodenoscopy (egd) with propofol (N/A, 07/25/2022); and biopsy (07/25/2022).  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 female with snoring.      Subjective:   She has been told her snoring is bad for years.  Her husband has to nudge her at night to shift position so she stops snoring and starts breathing.  She can wake up hearing herself snore.  She was in hospital last year for malignant hypertension and was told she should be assessed for sleep apnea.  She has several relatives that have sleep apnea.  She is a restless sleeper.  She gets cramps in her legs a couple times per month at night.  She goes to sleep at 10 pm.  She falls asleep 15 to 30 minutes.  She wakes up 2 times to use the bathroom.  She gets out of bed at 645 am.  She feels okay in the morning.  She denies morning headache.  She does not use anything to help her fall sleep or stay awake.  She will snack sometimes during the night, but she is aware of doing this.  Her mouth gets dry at night and she keeps water by her bedside.  She denies sleep walking, sleep talking, bruxism, or nightmares.  There is no history of restless legs.  She denies sleep hallucinations, sleep paralysis, or cataplexy.  The Epworth score is 4 out of 24.   Physical Exam:   Appearance - well kempt    ENMT - no sinus tenderness, no oral exudate, no LAN, Mallampati 4 airway, no stridor  Respiratory - equal breath sounds bilaterally, no wheezing or rales  CV - s1s2 regular rate and rhythm, no murmurs  Ext - no clubbing, no edema  Skin - no rashes  Psych - normal mood and affect   Sleep Tests:    Cardiac Tests:  Echo 05/04/22 >> EF 45 to 50%, severe LVH, grade 2 DD, severe LA dilation, mild MR  Social History:  She  reports that she has never smoked. She has never used smokeless tobacco. She reports that she does not drink alcohol and does not use drugs.  Family History:  Her family history includes Coronary artery disease in her maternal aunt and maternal aunt; Hyperlipidemia in her mother; Hypertension in her cousin, maternal aunt, maternal aunt, and mother; Thyroid disease in her paternal grandmother.    Discussion:  She has snoring, sleep disruption, apnea, nocturnal leg cramps, and daytime sleepiness.  She has history of refractory hypertension and chronic combined CHF.  Her BMI is > 35.  She has family history of sleep apnea.  I am concerned she could also have significant obstructive sleep apnea.  Assessment/Plan:   Snoring with excessive daytime sleepiness. - will need to arrange for a home sleep study  Chronic combined CHF. - followed by Dr. Skeet Latch with cardiology  Obesity. - discussed how weight can impact sleep and risk for sleep disordered breathing - discussed options to assist with weight loss: combination of diet modification, cardiovascular and strength training exercises  Cardiovascular risk. - had an extensive discussion regarding the adverse health consequences related to untreated sleep disordered breathing - specifically discussed the risks for hypertension, coronary artery disease, cardiac dysrhythmias, cerebrovascular disease, and diabetes - lifestyle modification discussed  Safe driving practices. - discussed how sleep disruption can  increase risk of accidents, particularly when driving - safe driving practices were discussed  Therapies for obstructive sleep apnea. - if the sleep study shows significant sleep apnea, then various therapies for treatment were reviewed: CPAP, oral appliance, and surgical interventions  Time Spent Involved in Patient Care on Day of Examination:  35 minutes  Follow up:   Patient Instructions  Will arrange for a home sleep study Will call to arrange for follow up after sleep study reviewed  Medication List:   Allergies as of 12/27/2022       Reactions   Ace Inhibitors Cough   Dust Mite Extract    Latex Itching   Pollen Extract         Medication List        Accurate as of December 27, 2022  9:01 AM. If you have any questions, ask your nurse or doctor.          amLODipine 10 MG tablet Commonly known as: NORVASC Take 1 tablet (10 mg total) by mouth daily.   cloNIDine 0.2 mg/24hr patch Commonly known as: CATAPRES - Dosed in mg/24 hr Place 1 patch (0.2 mg total) onto the skin once a week. Change every Friday   cyanocobalamin 1000 MCG tablet Commonly known as: VITAMIN B12 Take 1 tablet (1,000 mcg total) by mouth daily.   ferrous sulfate 325 (65 FE) MG EC tablet Take 1 tablet (325 mg total) by mouth daily with breakfast.   furosemide 40 MG tablet Commonly known as: Lasix Take 1 tablet (40 mg total) by mouth daily.   hydrALAZINE 100 MG tablet Commonly known as: APRESOLINE Take 1 tablet (100 mg total) by mouth 3 (three) times daily.   isosorbide mononitrate 60 MG 24 hr tablet Commonly known as: IMDUR Take 1 tablet (60 mg total) by mouth daily.   labetalol 300 MG tablet Commonly known as: NORMODYNE Take 1 tablet (300 mg total) by mouth 2 (two) times daily. For BP   levothyroxine 150 MCG tablet Commonly known as: SYNTHROID Take 1 tablet (150 mcg total) by mouth daily before breakfast.   norethindrone 5 MG tablet Commonly known as: Aygestin Take 1 tablet (5  mg total) by mouth daily.   pantoprazole 40 MG tablet Commonly known as: PROTONIX TAKE 1 TABLET (40 MG TOTAL) BY MOUTH TWICE A DAY BEFORE MEALS   sacubitril-valsartan 49-51 MG Commonly known as: ENTRESTO Take 1 tablet by mouth 2 (two) times daily.   Vitamin D (Ergocalciferol) 1.25 MG (50000 UNIT) Caps capsule Commonly known as: DRISDOL Take 50,000 Units by mouth every Wednesday.        Signature:  Chesley Mires, MD [place] Pager - [phone] 12/27/2022, 9:01 AM

## 2022-12-27 NOTE — Patient Instructions (Signed)
Will arrange for a home sleep study Will call to arrange for follow up after sleep study reviewed  

## 2022-12-29 ENCOUNTER — Telehealth: Payer: Self-pay

## 2022-12-29 NOTE — Telephone Encounter (Signed)
Called to follow up on attending PREP at [facility], has decided that evening class would fit best with her work schedule. Will ask Pam, RN [facility] to contact her about next evening class at [place].

## 2023-01-01 ENCOUNTER — Other Ambulatory Visit: Payer: Self-pay | Admitting: Family Medicine

## 2023-01-03 ENCOUNTER — Other Ambulatory Visit: Payer: Self-pay | Admitting: Pharmacist Clinician (PhC)/ Clinical Pharmacy Specialist

## 2023-01-03 ENCOUNTER — Encounter: Payer: Self-pay | Admitting: Pharmacist Clinician (PhC)/ Clinical Pharmacy Specialist

## 2023-01-03 ENCOUNTER — Ambulatory Visit
Payer: Managed Care, Other (non HMO) | Attending: Cardiovascular Disease | Admitting: Pharmacist Clinician (PhC)/ Clinical Pharmacy Specialist

## 2023-01-03 VITALS — BP 163/99 | HR 80

## 2023-01-03 DIAGNOSIS — E7849 Other hyperlipidemia: Secondary | ICD-10-CM

## 2023-01-03 DIAGNOSIS — R7303 Prediabetes: Secondary | ICD-10-CM

## 2023-01-03 DIAGNOSIS — I10 Essential (primary) hypertension: Secondary | ICD-10-CM

## 2023-01-03 LAB — LIPID PANEL
Chol/HDL Ratio: 4.8 ratio — ABNORMAL HIGH (ref 0.0–4.4)
Cholesterol, Total: 174 mg/dL (ref 100–199)
HDL: 36 mg/dL — ABNORMAL LOW (ref >39)
LDL Chol Calc (NIH): 121 mg/dL — ABNORMAL HIGH (ref 0–99)
Triglycerides: 92 mg/dL (ref 0–149)
VLDL Cholesterol Cal: 17 mg/dL (ref 5–40)

## 2023-01-03 MED ORDER — CLONIDINE 0.3 MG/24HR TD PTWK: 0.3000 mg | MEDICATED_PATCH | TRANSDERMAL | 12 refills | Status: DC

## 2023-01-03 NOTE — Assessment & Plan Note (Signed)
Assessment: BP is uncontrolled in office BP 163/99 mmHg;  above the goal (<130/80). Home readings actually appear worse than at her last visit Tolerates current medications well without any side effects Denies SOB, palpitation, chest pain, headaches,or swelling Reiterated the importance of regular exercise and low salt diet   Plan:  Increase clonidine patches to 0.3 mg weekly Continue taking all other medications Patient to keep record of BP readings with heart rate and report to [place] at the next visit Patient to follow up with Dr. Oval Linsey in February for follow up  Labs ordered today:  BMET, A1c, lipids If SCr stable will probably need to increase Entresto for better BP control

## 2023-01-03 NOTE — Progress Notes (Signed)
01/03/2023 Tracy Joseph 1971-10-02 [id number]   HPI:  Tracy Joseph is a 52 y.o. female patient of Dr Oval Linsey, with a PMH below who presents today for advanced hypertension clinic follow up.  She was seen  by Dr. [name] just 2 years ago as a consult for malignant hypertension, and at that first visit had a pressure of 205/135, dropping to 200/110 in the office.  Because of worsening kidney function, he started her on labetalol 100 mg bid.  At follow up with [name] pressure was still at 200/130.  She was lost to follow up until last month, when she was referred from her PCP to the [facility].  Pressure at that time was still elevated, 179/96.  She was agreeable to join our RPM research and randomized to group 1.  At her first visit with me we increased the Entresto to 49/51 mg bid and at the next, increased again to 97/103.  She never had blood work done and the dose was never increased.  She noted that December was a busy month with holiday parties and Christmas programs, and was eating foods she wouldn't normally.    Today she returns for her third visit.   No concerns since her last visit and feels as though she is back into her normal routines after the holiday season.   She has not had a BMET since August, so will take her to the lab after our visit today.    Past Medical History: hyperlipidemia 3/23 LDL 140, currently not on medication  CKD Stage 3b  CHF HFmrEF at 45-50%, on labetalol, Entresto; grade 2 diastolic dysfunction  DM2 3/23 A1c 6.1; was as high as 7.5 in 2020  OSA Study positive, has upcoming appointment  hypothyroid 10/23 TSH 0.035, T4 2.15; levothyroxine decreased to 150 mcg    Blood Pressure Goal:  130/80  Current Medications:   amlodipine 10 mg qd, clonidine 0.2 mg patch qFriday, hydralazine 100 mg tid, labetalol 300 mg bid, Entresto 49/51 bid  Family Hx:  strong family history of hypertension; all of her cousins are on 5+ meds,  mother and aunts good with 1-2 meds; maternal GM died stroke; 2 aunts with CABG, father family more cancers; has 1 daughter, no diagnosed hypertension  Social Hx: no tobacco, only occasional alcohol; no soda in 2 weeks, does drink coffee when cold outside [employer])      Diet:  eats out except 1-2 times per week,  at home experimenting with [employer] and other seasonings    Exercise: [employer] three times weekly (45-60 min/visit); PREP starts next week  Home BP readings:  4 home readings with her today, average 169/89 (Last month 7 readings average 159/95)    Intolerances:  Ace inhibitors - cough Amlodipine - LE edema Benicar - cough, dry, pruritis  Labs: 8/23:  Na 137, K 3.6, Glu 84, BUN 16, SCr 1.26, GFR 51   Wt Readings from Last 3 Encounters:  12/27/22 222 lb 3.2 oz (100.8 kg)  12/20/22 224 lb (101.6 kg)  12/01/22 212 lb (96.2 kg)   BP Readings from Last 3 Encounters:  01/03/23 (!) 163/99  12/27/22 (!) 144/90  12/20/22 (!) 143/76   Pulse Readings from Last 3 Encounters:  01/03/23 80  12/27/22 74  12/20/22 85    Current Outpatient Medications  Medication Sig Dispense Refill   cloNIDine (CATAPRES - DOSED IN MG/24 HR) 0.3 mg/24hr patch Place 1 patch (0.3 mg total) onto the  skin once a week. 4 patch 12   amLODipine (NORVASC) 10 MG tablet Take 1 tablet (10 mg total) by mouth daily. 90 tablet 3   ferrous sulfate 325 (65 FE) MG EC tablet Take 1 tablet (325 mg total) by mouth daily with breakfast. 90 tablet 3   furosemide (LASIX) 40 MG tablet Take 1 tablet (40 mg total) by mouth daily. 90 tablet 3   hydrALAZINE (APRESOLINE) 100 MG tablet Take 1 tablet (100 mg total) by mouth 3 (three) times daily. 270 tablet 2   isosorbide mononitrate (IMDUR) 60 MG 24 hr tablet Take 1 tablet (60 mg total) by mouth daily. 90 tablet 3   labetalol (NORMODYNE) 300 MG tablet Take 1 tablet (300 mg total) by mouth 2 (two) times daily. For BP 180 tablet 3   levothyroxine (SYNTHROID) 150 MCG tablet  Take 1 tablet (150 mcg total) by mouth daily before breakfast. 90 tablet 1   norethindrone (AYGESTIN) 5 MG tablet Take 1 tablet (5 mg total) by mouth daily. 90 tablet 4   pantoprazole (PROTONIX) 40 MG tablet TAKE 1 TABLET (40 MG TOTAL) BY MOUTH TWICE A DAY BEFORE MEALS 180 tablet 1   sacubitril-valsartan (ENTRESTO) 49-51 MG Take 1 tablet by mouth 2 (two) times daily. 60 tablet 3   vitamin B-12 (CYANOCOBALAMIN) 1000 MCG tablet Take 1 tablet (1,000 mcg total) by mouth daily. 30 tablet 3   Vitamin D, Ergocalciferol, (DRISDOL) 1.25 MG (50000 UNIT) CAPS capsule TAKE 1 CAPSULE (50,000 UNITS TOTAL) BY MOUTH ONCE A WEEK. ONE CAPSULE ONCE WEEKLY 12 capsule 2   No current facility-administered medications for this visit.    Allergies  Allergen Reactions   Ace Inhibitors Cough   Dust Mite Extract    Latex Itching   Pollen Extract     Past Medical History:  Diagnosis Date   ACE-inhibitor cough    Chronic combined systolic and diastolic heart failure ([place]) 05/04/2022   New dx in 04/2022 during hospitalization. Severe LVH, grade2 diastolic dysfunction, EF 45 to 50 %   Chronic GERD    Essential hypertension    Goiter    Initially hyperthyroid treated with RAI, now hypothyroid - follows with Dr. [name]   Helicobacter pylori ab+    Hypertension    Phreesia 10/12/2020   Hypothyroidism    Iron deficiency anemia    Mixed hyperlipidemia    Obstructive sleep apnea    Suspected    Prediabetes    Seasonal allergies    Tubular adenoma of colon     Blood pressure (!) 163/99, pulse 80.  HYPERTENSION CONTROL Vitals:   01/03/23 0816 01/03/23 0821  BP: (!) 156/99 (!) 163/99    The patient's blood pressure is elevated above target today.  In order to address the patient's elevated BP: A current anti-hypertensive medication was adjusted today.    Malignant hypertension Assessment: BP is uncontrolled in office BP 163/99 mmHg;  above the goal (<130/80). Home readings actually appear worse than  at her last visit Tolerates current medications well without any side effects Denies SOB, palpitation, chest pain, headaches,or swelling Reiterated the importance of regular exercise and low salt diet   Plan:  Increase clonidine patches to 0.3 mg weekly Continue taking all other medications Patient to keep record of BP readings with heart rate and report to [place] at the next visit Patient to follow up with Dr. Oval Linsey in February for follow up  Labs ordered today:  BMET, A1c, lipids If SCr stable will probably need to  increase Entresto for better BP control   Tommy Medal PharmD CPP [place] [address] [place],  [id number] [phone]

## 2023-01-03 NOTE — Patient Instructions (Addendum)
Follow up appointment: With Dr. Oval Linsey on Wednesday Feb 14 at 8:15 am  Go to the lab this morning  Take your BP meds as follows:   Increase clonidine patch to 0.3 mg weekly   Continue with all your other medications.   Check your blood pressure at home daily (if able) and keep record of the readings.  Hypertension "High blood pressure"  Hypertension is often called "The Silent Killer." It rarely causes symptoms until it is extremely  high or has done damage to other organs in the body. For this reason, you should have your  blood pressure checked regularly by your physician. We will check your blood pressure  every time you see a provider at one of our offices.   Your blood pressure reading consists of two numbers. Ideally, blood pressure should be  below 120/80. The first ("top") number is called the systolic pressure. It measures the  pressure in your arteries as your heart beats. The second ("bottom") number is called the diastolic pressure. It measures the pressure in your arteries as the heart relaxes between beats.  The benefits of getting your blood pressure under control are enormous. A 10-point  reduction in systolic blood pressure can reduce your risk of stroke by 27% and heart failure by 28%  Your blood pressure goal is < 130/80  To check your pressure at home you will need to:  1. Sit up in a chair, with feet flat on the floor and back supported. Do not cross your ankles or legs. 2. Rest your left arm so that the cuff is about heart level. If the cuff goes on your upper arm,  then just relax the arm on the table, arm of the chair or your lap. If you have a wrist cuff, we  suggest relaxing your wrist against your chest (think of it as Pledging the Flag with the  wrong arm).  3. Place the cuff snugly around your arm, about 1 inch above the crook of your elbow. The  cords should be inside the groove of your elbow.  4. Sit quietly, with the cuff in place, for about 5  minutes. After that 5 minutes press the power  button to start a reading. 5. Do not talk or move while the reading is taking place.  6. Record your readings on a sheet of paper. Although most cuffs have a memory, it is often  easier to see a pattern developing when the numbers are all in front of you.  7. You can repeat the reading after 1-3 minutes if it is recommended  Make sure your bladder is empty and you have not had caffeine or tobacco within the last 30 min  Always bring your blood pressure log with you to your appointments. If you have not brought your monitor in to be double checked for accuracy, please bring it to your next appointment.  You can find a list of quality blood pressure cuffs at validatebp.org

## 2023-01-04 LAB — BASIC METABOLIC PANEL
BUN/Creatinine Ratio: 14 (ref 9–23)
BUN: 22 mg/dL (ref 6–24)
CO2: 19 mmol/L — ABNORMAL LOW (ref 20–29)
Calcium: 9.3 mg/dL (ref 8.7–10.2)
Chloride: 104 mmol/L (ref 96–106)
Creatinine, Ser: 1.52 mg/dL — ABNORMAL HIGH (ref 0.57–1.00)
Glucose: 97 mg/dL (ref 70–99)
Potassium: 4.5 mmol/L (ref 3.5–5.2)
Sodium: 139 mmol/L (ref 134–144)
eGFR: 41 mL/min/{1.73_m2} — ABNORMAL LOW (ref >59)

## 2023-01-04 LAB — HEMOGLOBIN A1C
Est. average glucose Bld gHb Est-mCnc: 126 mg/dL
Hgb A1c MFr Bld: 6 % — ABNORMAL HIGH (ref 4.8–5.6)

## 2023-01-11 ENCOUNTER — Ambulatory Visit (HOSPITAL_COMMUNITY): Payer: Managed Care, Other (non HMO)

## 2023-01-15 ENCOUNTER — Telehealth (HOSPITAL_BASED_OUTPATIENT_CLINIC_OR_DEPARTMENT_OTHER): Payer: Self-pay | Admitting: *Deleted

## 2023-01-15 DIAGNOSIS — Z5181 Encounter for therapeutic drug level monitoring: Secondary | ICD-10-CM

## 2023-01-15 DIAGNOSIS — E7849 Other hyperlipidemia: Secondary | ICD-10-CM

## 2023-01-15 DIAGNOSIS — I1A0 Resistant hypertension: Secondary | ICD-10-CM

## 2023-01-15 NOTE — Telephone Encounter (Signed)
Left message to call back both sets of results

## 2023-01-15 NOTE — Telephone Encounter (Signed)
-----  Message from Loel Dubonnet, NP sent at 01/15/2023  8:26 AM EST ----- A1c is at goal of less than 7.  Normal electrolytes.  Kidney function overall stable.  Ensure staying well-hydrated.

## 2023-01-15 NOTE — Telephone Encounter (Signed)
-----  Message from Loel Dubonnet, NP sent at 01/15/2023  8:26 AM EST ----- Cholesterol panel elevated. The 10-year ASCVD risk score (Arnett DK, et al., 2019) is: 30.9% which is high. Recommend addition of Rosuvastatin '10mg'$  daily with repeat FLP/LFT in 2 months.

## 2023-01-17 ENCOUNTER — Ambulatory Visit (HOSPITAL_COMMUNITY)
Admission: RE | Admit: 2023-01-17 | Discharge: 2023-01-17 | Disposition: A | Discharge status: Home / Self Care | Payer: Managed Care, Other (non HMO) | Source: Ambulatory Visit | Attending: Family Medicine | Admitting: Family Medicine

## 2023-01-17 DIAGNOSIS — Z1231 Encounter for screening mammogram for malignant neoplasm of breast: Secondary | ICD-10-CM | POA: Diagnosis not present

## 2023-01-30 NOTE — Progress Notes (Incomplete)
Advanced Hypertension Clinic Follow-up:    Date:  01/30/2023   ID:  Tracy Joseph, DOB 02-25-1971, MRN [id number]  PCP:  [name], MD  Cardiologist:  Werner Lean, MD  Nephrologist:  Referring MD: [name], MD   CC: Hypertension  History of Present Illness:    Tracy Joseph is a 52 y.o. female with a hx of hypertension, prediabetes, hyperlipidemia, CKD IIIb, and OSA, here for follow-up. She was initially seen 09/25/2022 in the [facility]. She saw Dr. [name] in 2021 for malignant hypertension. At the time she was struggling with a lot of stress. Blood pressures were in the 200s/140s. He added spironolactone and referred her for a sleep study. She was also started on atorvastatin. Her kidney function worsened so spironolactone was stopped and she was started on labetalol. She followed up with our pharmacist and was working on lifestyle improvement. Isordil was added to her regimen. She was seen in the ED with chest pain 04/2022. BP was 220/127. BNP was 140 and high sensitivity troponin was mildly elevated but flat. She was treated with IV lasix and a nitroglycerin drip. Echo revealed LVEF 45-50% with severe LVH. The LV cavity was severely dilated.  She saw Dr. Dorris Fetch and was advised in lifestyle medicine. Labs were negative for hyperaldosteronism.   At her initial appointment she reported struggling with hypertension for 20 years. She had resistant hypertension uncontrolled despite her regimen of amlodipine, clonidine, hydralazine, labetalol, and Imdur. Given her chronic kidney disease and systolic dysfunction she was started on Entresto 24/26 mg daily. Renin and aldosterone levels were within normal limits. She had renal artery dopplers 10/2022 showing no evidence of stenosis. She was referred to [facility]. She had a sleep study that was pending.*** She saw our pharmacist 10/26/2022 noting home BP readings 160-170's, nothing as high as 180; lowest  123456; diastolic all < [id number]. She was taking her meds around 9 am, then mid-day hydralazine around 1 pm, and evening meds at 7 pm. Delene Loll was increased to 49/51 mg BID. Her blood pressure remained elevated at follow-up so Entresto was again increased to 97/103 mg BID. She last saw our pharmacist 01/03/2023 and her blood pressure was 163/99. Clonidine was increased to 0.3 mg weekly. Lipids showed LDL 121. Rosuvastatin 10 mg daily was recommended. Today, she states she has been struggling with hypertension since her early 34's. It has always been difficult to control. At home her blood pressure is frequently similar to today's in clinic reading 181/104. She notes that many members of her family also have hypertension and are taking similar medications. They often share strategies for managing hypertension with lifestyle changes. For exercise she walks at work, and goes to [employer] 3 times a week. Her routine consists of using the elliptical and treadmill for 15 minutes each, and some weight lifting. Initially she had felt short of breath while exercising. Now this has resolved after adhering to her routine for 2 years. She cooks her meals at least twice a week, and orders out otherwise. She enjoys potato chips. Since May she has been watching her salt and opting for alternative seasonings. Intermittently she tries to cut back on drinking sodas. Normally she may have one bottle of soda or tea. She has never smoked. Every once in a while she may take tylenol for a rare headache; she now avoids Aleve. Usually she takes her medications with food as she tends to feel nauseous with her meds. Also, her sleep  study was positive for sleep apnea. She has a follow-up visit pending. She denies any palpitations, chest pain, or peripheral edema. No lightheadedness, syncope, orthopnea, or PND.  Today,  She denies any palpitations, chest pain, shortness of breath, or peripheral edema. No lightheadedness, headaches,  syncope, orthopnea, or PND.  (+)  ***Plan: -  Previous antihypertensives: Ace inhibitors - cough Amlodipine - LE edema Benicar - cough, dry, pruritis  Past Medical History:  Diagnosis Date   ACE-inhibitor cough    Chronic combined systolic and diastolic heart failure ([place]) 05/04/2022   New dx in 04/2022 during hospitalization. Severe LVH, grade2 diastolic dysfunction, EF 45 to 50 %   Chronic GERD    Essential hypertension    Goiter    Initially hyperthyroid treated with RAI, now hypothyroid - follows with Dr. [name]   Helicobacter pylori ab+    Hypertension    Phreesia 10/12/2020   Hypothyroidism    Iron deficiency anemia    Mixed hyperlipidemia    Obstructive sleep apnea    Suspected    Prediabetes    Seasonal allergies    Tubular adenoma of colon     Past Surgical History:  Procedure Laterality Date   BIOPSY  07/25/2022   Procedure: BIOPSY;  Surgeon: Harvel Quale, MD;  [facility];  Service: Gastroenterology;;   COLONOSCOPY N/A 05/03/2017   Procedure: COLONOSCOPY;  Surgeon: Rogene Houston, MD;  Location: [facility];  Service: Endoscopy;  Laterality: N/A;  930   COLONOSCOPY WITH PROPOFOL N/A 07/25/2022   Procedure: COLONOSCOPY WITH PROPOFOL;  Surgeon: Harvel Quale, MD;  Location: [facility];  Service: Gastroenterology;  Laterality: N/A;  1130 am   ESOPHAGOGASTRODUODENOSCOPY (EGD) WITH PROPOFOL N/A 07/25/2022   Procedure: ESOPHAGOGASTRODUODENOSCOPY (EGD) WITH PROPOFOL;  Surgeon: Harvel Quale, MD;  Location: [facility];  Service: Gastroenterology;  Laterality: N/A;   TUBAL LIGATION  2005    Current Medications: No outpatient medications have been marked as taking for the 01/31/23 encounter (Appointment) with Skeet Latch, MD.     Allergies:   Ace inhibitors, Dust mite extract, Latex, and Pollen extract   Social History   Socioeconomic History   Marital status: Married    Spouse name: Not on file    Number of children: 1   Years of education: Not on file   Highest education level: Not on file  Occupational History   Occupation: full time student for [employer]  Tobacco Use   Smoking status: Never   Smokeless tobacco: Never  Vaping Use   Vaping Use: Never used  Substance and Sexual Activity   Alcohol use: No   Drug use: No   Sexual activity: Yes    Birth control/protection: None, Post-menopausal  Other Topics Concern   Not on file  Social History Narrative   Not on file   Social Determinants of Health   Financial Resource Strain: Low Risk  (09/27/2022)   Overall Financial Resource Strain (CARDIA)    Difficulty of Paying Living Expenses: Not hard at all  Food Insecurity: No Food Insecurity (09/27/2022)   Hunger Vital Sign    Worried About Running Out of Food in the Last Year: Never true    [place] in the Last Year: Never true  Transportation Needs: No Transportation Needs (09/27/2022)   PRAPARE - Hydrologist (Medical): No    Lack of Transportation (Non-Medical): No  Physical Activity: Sufficiently Active (09/27/2022)   Exercise Vital Sign  Days of Exercise per Week: 3 days    Minutes of Exercise per Session: 60 min  Stress: No Stress Concern Present (09/27/2022)   [place]    Feeling of Stress : Not at all  Social Connections: [place] (09/27/2022)   Social Connection and Isolation Panel [NHANES]    Frequency of Communication with Friends and Family: Three times a week    Frequency of Social Gatherings with Friends and Family: Once a week    Attends Religious Services: More than 4 times per year    Active Member of [employer] or Organizations: Yes    Attends Archivist Meetings: 1 to 4 times per year    Marital Status: Married     Family History: The patient's family history includes Coronary artery disease in her maternal aunt and maternal aunt;  Hyperlipidemia in her mother; Hypertension in her cousin, maternal aunt, maternal aunt, and mother; Thyroid disease in her paternal grandmother.  ROS:   Please see the history of present illness.    All other systems reviewed and are negative.  EKGs/Labs/Other Studies Reviewed:    Bilateral Renal Artery Doppler  10/25/2022: Summary:  Renal:    Right: Normal size right kidney. RRV flow present. Normal right         Resisitive Index. No evidence of right renal artery stenosis.         Normal cortical thickness of right kidney.  Left:  Normal left Resistive Index. Normal size of left kidney. LRV         flow present. No evidence of left renal artery stenosis.         Normal cortical thickness of the left kidney.  Mesenteric:  Normal Superior Mesenteric artery and Celiac artery findings.   Echocardiogram  05/04/2022:  1. Left ventricular ejection fraction, by estimation, is 45 to 50%. The  left ventricle has mildly decreased function. The left ventricle has no  regional wall motion abnormalities. The left ventricular internal cavity  size was severely dilated. There is  severe left ventricular hypertrophy. Left ventricular diastolic parameters  are consistent with Grade II diastolic dysfunction (pseudonormalization).  Elevated left atrial pressure.   2. Right ventricular systolic function is normal. The right ventricular  size is normal.   3. Left atrial size was severely dilated.   4. Right atrial size was mildly dilated.   5. The mitral valve is normal in structure. Mild mitral valve  regurgitation.   6. The aortic valve is tricuspid. Aortic valve regurgitation is not  visualized. Aortic valve sclerosis is present, with no evidence of aortic  valve stenosis.   7. The inferior vena cava is normal in size with greater than 50%  respiratory variability, suggesting right atrial pressure of 3 mmHg.    EKG:  EKG is personally reviewed. 01/30/2023:  Sinus ***. Rate *** bpm. 09/25/2022:   EKG was not ordered.   Recent Labs: 05/04/2022: B Natriuretic Peptide 140.0; Magnesium 2.1 07/20/2022: ALT 8 10/06/2022: TSH 0.035 12/13/2022: Hemoglobin 14.0; Platelets 259 01/03/2023: BUN 22; Creatinine, Ser 1.52; Potassium 4.5; Sodium 139   Recent Lipid Panel    Component Value Date/Time   CHOL 174 01/03/2023 0844   TRIG 92 01/03/2023 0844   HDL 36 (L) 01/03/2023 0844   CHOLHDL 4.8 (H) 01/03/2023 0844   CHOLHDL 4.7 09/16/2019 0847   VLDL 23 01/24/2017 0916   LDLCALC 121 (H) 01/03/2023 0844   LDLCALC 136 (H) 09/16/2019 [id number]  Physical Exam:    VS:  There were no vitals taken for this visit. , BMI There is no height or weight on file to calculate BMI. GENERAL:  Well appearing HEENT: Pupils equal round and reactive, fundi not visualized, oral mucosa unremarkable NECK:  No jugular venous distention, waveform within normal limits, carotid upstroke brisk and symmetric, no bruits, no thyromegaly LUNGS:  Clear to auscultation bilaterally HEART:  RRR.  PMI not displaced or sustained,S1 and S2 within normal limits, no S3, no S4, no clicks, no rubs, 2/6 systolic murmur. ABD:  Flat, positive bowel sounds normal in frequency in pitch, no bruits, no rebound, no guarding, no midline pulsatile mass, no hepatomegaly, no splenomegaly EXT:  2 plus pulses throughout, no edema, no cyanosis no clubbing SKIN:  No rashes no nodules NEURO:  Cranial nerves II through XII grossly intact, motor grossly intact throughout PSYCH:  Cognitively intact, oriented to person place and time   ASSESSMENT/PLAN:    No problem-specific Assessment & Plan notes found for this encounter.    Screening for Secondary Hypertension:     09/25/2022    9:13 AM 10/26/2022    6:34 AM  Causes  Drugs/Herbals Screened      - Comments One tea daily, trying to limit sodium, rare EtOH, no tobaccos use.  No NSAIDS.   Renovascular HTN Screened Screened     - Comments Check renal artery Dopplers no RAS  Sleep Apnea Screened       - Comments sleep study pending   Thyroid Disease Screened   Hyperaldosteronism Screened   Pheochromocytoma N/A   Cushing's Syndrome N/A   Hyperparathyroidism Screened   Coarctation of the Aorta Screened      - Comments BP symmetric   Compliance Screened     Relevant Labs/Studies:    Latest Ref Rng & Units 01/03/2023    8:45 AM 07/20/2022   11:44 AM 05/17/2022   10:23 AM  Basic Labs  Sodium 134 - 144 mmol/L 139  137  141   Potassium 3.5 - 5.2 mmol/L 4.5  3.6  4.0   Creatinine 0.57 - 1.00 mg/dL 1.52  1.26  1.44        Latest Ref Rng & Units 10/06/2022    4:54 PM 05/17/2022   10:23 AM  Thyroid   TSH 0.450 - 4.500 uIU/mL 0.035  2.640        Latest Ref Rng & Units 05/04/2022    8:38 AM  Renin/Aldosterone   Aldosterone 0.0 - 30.0 ng/dL 18.6              10/25/2022   10:58 AM  Renovascular   Renal Artery [place] Completed Yes    Disposition:    FU with APP/PharmD in 1 month for the next 3 months.   FU with Tiffany C. Oval Linsey, MD, [facility] in ***4 months.  Medication Adjustments/Labs and Tests Ordered: Current medicines are reviewed at length with the patient today.  Concerns regarding medicines are outlined above.   No orders of the defined types were placed in this encounter.  No orders of the defined types were placed in this encounter.  I,Mathew Stumpf,acting as a Education administrator for Skeet Latch, MD.,have documented all relevant documentation on the behalf of Skeet Latch, MD,as directed by  Skeet Latch, MD while in the presence of Skeet Latch, MD.  I, Ruth Oval Linsey, MD have reviewed all documentation for this visit.  The documentation of the exam, diagnosis, procedures, and orders on 01/30/2023 are all accurate  and complete.  Waynetta Pean  01/30/2023 12:41 PM    [facility]

## 2023-01-31 ENCOUNTER — Ambulatory Visit (HOSPITAL_BASED_OUTPATIENT_CLINIC_OR_DEPARTMENT_OTHER): Payer: Managed Care, Other (non HMO) | Admitting: Cardiovascular Disease

## 2023-02-05 ENCOUNTER — Ambulatory Visit: Payer: Managed Care, Other (non HMO)

## 2023-02-05 DIAGNOSIS — G4733 Obstructive sleep apnea (adult) (pediatric): Secondary | ICD-10-CM

## 2023-02-05 DIAGNOSIS — R0683 Snoring: Secondary | ICD-10-CM

## 2023-02-08 ENCOUNTER — Telehealth: Payer: Self-pay | Admitting: Pulmonary Disease

## 2023-02-08 DIAGNOSIS — G4733 Obstructive sleep apnea (adult) (pediatric): Secondary | ICD-10-CM | POA: Diagnosis not present

## 2023-02-08 NOTE — Telephone Encounter (Signed)
ATC patient and no answer.  Mychart message sent with HST results and advised patient to call office for concerns and to make an appointment.

## 2023-02-08 NOTE — Telephone Encounter (Signed)
HST 02/05/23 >> AHI 11, SpO2 low 77%   Please inform her that her sleep study shows mild obstructive sleep apnea.  Please arrange for ROV with me or NP to discuss treatment options.

## 2023-02-13 NOTE — Telephone Encounter (Signed)
Patient returned a call to discuss her sleep results.  She would like an appt. On early Wednesday.  Please call patient back to discuss at [phone]

## 2023-02-22 ENCOUNTER — Encounter (HOSPITAL_BASED_OUTPATIENT_CLINIC_OR_DEPARTMENT_OTHER): Payer: Self-pay | Admitting: *Deleted

## 2023-02-22 MED ORDER — ROSUVASTATIN CALCIUM 10 MG PO TABS: 10.0000 mg | ORAL_TABLET | Freq: Every day | ORAL | 1 refills | Status: DC

## 2023-02-22 NOTE — Telephone Encounter (Signed)
Patient read lab results with comments but never returned call  Sent mychart message that Crestor will be sent to [facility] and mailed lab orders

## 2023-03-02 ENCOUNTER — Encounter: Payer: Self-pay | Admitting: Family Medicine

## 2023-03-02 ENCOUNTER — Ambulatory Visit: Payer: Managed Care, Other (non HMO) | Admitting: Family Medicine

## 2023-03-02 ENCOUNTER — Other Ambulatory Visit: Payer: Self-pay | Admitting: Family Medicine

## 2023-03-02 VITALS — BP 148/96 | HR 85 | Ht 65.0 in | Wt 222.0 lb

## 2023-03-02 DIAGNOSIS — E782 Mixed hyperlipidemia: Secondary | ICD-10-CM

## 2023-03-02 DIAGNOSIS — I10 Essential (primary) hypertension: Secondary | ICD-10-CM

## 2023-03-02 DIAGNOSIS — E890 Postprocedural hypothyroidism: Secondary | ICD-10-CM | POA: Diagnosis not present

## 2023-03-02 DIAGNOSIS — N920 Excessive and frequent menstruation with regular cycle: Secondary | ICD-10-CM | POA: Diagnosis not present

## 2023-03-02 DIAGNOSIS — R7303 Prediabetes: Secondary | ICD-10-CM

## 2023-03-02 DIAGNOSIS — Z6836 Body mass index (BMI) 36.0-36.9, adult: Secondary | ICD-10-CM

## 2023-03-02 MED ORDER — WEGOVY 0.25 MG/0.5ML ~~LOC~~ SOAJ: 0.2500 mg | SUBCUTANEOUS | 0 refills | Status: DC

## 2023-03-02 MED ORDER — TIRZEPATIDE-WEIGHT MANAGEMENT 2.5 MG/0.5ML ~~LOC~~ SOAJ: 2.5000 mg | SUBCUTANEOUS | 1 refills | Status: DC

## 2023-03-02 NOTE — Patient Instructions (Signed)
F/U in 4.5 months, call if you need me sooner  Keep up great push to improve health, it WILL WORK   Zepbound once weekly is prescribed I HOPE it is covered , let me know  It is important that you exercise regularly at least 30 minutes 5 times a week. If you develop chest pain, have severe difficulty breathing, or feel very tired, stop exercising immediately and seek medical attention    Think about what you will eat, plan ahead. Choose " clean, green, fresh or frozen" over canned, processed or packaged foods which are more sugary, salty and fatty. 70 to 75% of food eaten should be vegetables and fruit. Three meals at set times with snacks allowed between meals, but they must be fruit or vegetables. Aim to eat over a 12 hour period , example 7 am to 7 pm, and STOP after  your last meal of the day. Drink water,generally about 64 ounces per day, no other drink is as healthy. Fruit juice is best enjoyed in a healthy way, by EATING the fruit.   Thanks for choosing [facility], we consider it a privelige to serve you.

## 2023-03-04 ENCOUNTER — Encounter: Payer: Self-pay | Admitting: Family Medicine

## 2023-03-04 NOTE — Progress Notes (Signed)
Tracy Joseph     MRN: [id number]      DOB: Oct 23, 1971   HPI Tracy Joseph is here for follow up and re-evaluation of chronic medical conditions, medication management and review of any available recent lab and radiology data.  Preventive health is updated, specifically  Cancer screening and Immunization.   Questions or concerns regarding consultations or procedures which the PT has had in the interim are  addressed. The PT denies any adverse reactions to current medications since the last visit.  There are no new concerns.  There are no specific complaints   ROS Denies recent fever or chills. Denies sinus pressure, nasal congestion, ear pain or sore throat. Denies chest congestion, productive cough or wheezing. Denies chest pains, palpitations and leg swelling Denies abdominal pain, nausea, vomiting,diarrhea or constipation.   Denies dysuria, frequency, hesitancy or incontinence. Denies joint pain, swelling and limitation in mobility. Denies headaches, seizures, numbness, or tingling. Denies depression, anxiety or insomnia. Denies skin break down or rash.   PE  BP (!) 148/96   Pulse 85   Ht 5\' 5"  (1.651 m)   Wt 222 lb 0.6 oz (100.7 kg)   SpO2 94%   BMI 36.95 kg/m   Patient alert and oriented and in no cardiopulmonary distress.  HEENT: No facial asymmetry, EOMI,     Neck supple .  Chest: Clear to auscultation bilaterally.  CVS: S1, S2 no murmurs, no S3.Regular rate.  ABD: Soft non tender.   Ext: No edema  MS: Adequate ROM spine, shoulders, hips and knees.  Skin: Intact, no ulcerations or rash noted.  Psych: Good eye contact, normal affect. Memory intact not anxious or depressed appearing.  CNS: CN 2-12 intact, power,  normal throughout.no focal deficits noted.   Assessment & Plan  Malignant hypertension Elevated at visit, compliant with meds, managed at [place] and commitment to daily physical activity for a minimum of 30 minutes discussed and  encouraged, as a part of hypertension management. The importance of attaining a healthy weight is also discussed.     03/02/2023    8:58 AM 03/02/2023    8:31 AM 01/03/2023    8:21 AM 01/03/2023    8:16 AM 12/27/2022    8:37 AM 12/20/2022    3:27 PM 12/20/2022    3:24 PM  BP/Weight  Systolic BP 123456 0000000 [id number] 123456 [id number] 123456  Diastolic BP 96 84 99 99 90 76 77  Wt. (Lbs)  222.04   222.2  224  BMI  36.95 kg/m2   36.98 kg/m2  37.28 kg/m2       Hypothyroidism following radioiodine therapy Endocrine treating stable and controlled currently  Flooding during periods Resolved has had Gyne eval and started hormonal management   Mixed hyperlipidemia Hyperlipidemia:Low fat diet discussed and encouraged.   Lipid Panel  Lab Results  Component Value Date   CHOL 174 01/03/2023   HDL 36 (L) 01/03/2023   LDLCALC 121 (H) 01/03/2023   TRIG 92 01/03/2023   CHOLHDL 4.8 (H) 01/03/2023     Updated lab needed at/ before next visit.   Prediabetes Patient educated about the importance of limiting  Carbohydrate intake , the need to commit to daily physical activity for a minimum of 30 minutes , and to commit weight loss. The fact that changes in all these areas will reduce or eliminate all together the development of diabetes is stressed.      Latest Ref Rng & Units 01/03/2023    8:45  AM 01/03/2023    8:44 AM 07/20/2022   11:44 AM 05/17/2022   10:23 AM 05/07/2022    3:58 AM  Diabetic Labs  HbA1c 4.8 - 5.6 % 6.0       Chol 100 - 199 mg/dL  174      HDL >39 mg/dL  36      Calc LDL 0 - 99 mg/dL  121      Triglycerides 0 - 149 mg/dL  92      Creatinine 0.57 - 1.00 mg/dL 1.52   1.26  1.44  1.57       03/02/2023    8:58 AM 03/02/2023    8:31 AM 01/03/2023    8:21 AM 01/03/2023    8:16 AM 12/27/2022    8:37 AM 12/20/2022    3:27 PM 12/20/2022    3:24 PM  BP/Weight  Systolic BP 123456 0000000 [id number] 123456 [id number] 123456  Diastolic BP 96 84 99 99 90 76 77  Wt. (Lbs)  222.04   222.2  224  BMI  36.95 kg/m2   36.98  kg/m2  37.28 kg/m2      Latest Ref Rng & Units 03/29/2021   12:00 AM 12/01/2020    8:00 AM  Foot/eye exam completion dates  Eye Exam No Retinopathy No Retinopathy       Foot Form Completion   Done     This result is from an external source.      Class 2 severe obesity due to excess calories with serious comorbidity and body mass index (BMI) of 36.0 to 36.9 in adult [facility])  Patient re-educated about  the importance of commitment to a  minimum of 150 minutes of exercise per week as able.  The importance of healthy food choices with portion control discussed, as well as eating regularly and within a 12 hour window most days. The need to choose "clean , green" food 50 to 75% of the time is discussed, as well as to make water the primary drink and set a goal of 64 ounces water daily.       03/02/2023    8:31 AM 12/27/2022    8:37 AM 12/20/2022    3:24 PM  Weight /BMI  Weight 222 lb 0.6 oz 222 lb 3.2 oz 224 lb  Height 5\' 5"  (1.651 m) 5\' 5"  (1.651 m) 5\' 5"  (1.651 m)  BMI 36.95 kg/m2 36.98 kg/m2 37.28 kg/m2    Zepbound is prescribed

## 2023-03-04 NOTE — Assessment & Plan Note (Signed)
Hyperlipidemia:Low fat diet discussed and encouraged.   Lipid Panel  Lab Results  Component Value Date   CHOL 174 01/03/2023   HDL 36 (L) 01/03/2023   LDLCALC 121 (H) 01/03/2023   TRIG 92 01/03/2023   CHOLHDL 4.8 (H) 01/03/2023     Updated lab needed at/ before next visit.

## 2023-03-04 NOTE — Assessment & Plan Note (Signed)
Endocrine treating stable and controlled currently

## 2023-03-04 NOTE — Assessment & Plan Note (Signed)
Resolved has had Gyne eval and started hormonal management

## 2023-03-04 NOTE — Assessment & Plan Note (Signed)
  Patient re-educated about  the importance of commitment to a  minimum of 150 minutes of exercise per week as able.  The importance of healthy food choices with portion control discussed, as well as eating regularly and within a 12 hour window most days. The need to choose "clean , green" food 50 to 75% of the time is discussed, as well as to make water the primary drink and set a goal of 64 ounces water daily.       03/02/2023    8:31 AM 12/27/2022    8:37 AM 12/20/2022    3:24 PM  Weight /BMI  Weight 222 lb 0.6 oz 222 lb 3.2 oz 224 lb  Height 5\' 5"  (1.651 m) 5\' 5"  (1.651 m) 5\' 5"  (1.651 m)  BMI 36.95 kg/m2 36.98 kg/m2 37.28 kg/m2    Zepbound is prescribed

## 2023-03-04 NOTE — Assessment & Plan Note (Signed)
Patient educated about the importance of limiting  Carbohydrate intake , the need to commit to daily physical activity for a minimum of 30 minutes , and to commit weight loss. The fact that changes in all these areas will reduce or eliminate all together the development of diabetes is stressed.      Latest Ref Rng & Units 01/03/2023    8:45 AM 01/03/2023    8:44 AM 07/20/2022   11:44 AM 05/17/2022   10:23 AM 05/07/2022    3:58 AM  Diabetic Labs  HbA1c 4.8 - 5.6 % 6.0       Chol 100 - 199 mg/dL  174      HDL >39 mg/dL  36      Calc LDL 0 - 99 mg/dL  121      Triglycerides 0 - 149 mg/dL  92      Creatinine 0.57 - 1.00 mg/dL 1.52   1.26  1.44  1.57       03/02/2023    8:58 AM 03/02/2023    8:31 AM 01/03/2023    8:21 AM 01/03/2023    8:16 AM 12/27/2022    8:37 AM 12/20/2022    3:27 PM 12/20/2022    3:24 PM  BP/Weight  Systolic BP 123456 0000000 [id number] 123456 [id number] 123456  Diastolic BP 96 84 99 99 90 76 77  Wt. (Lbs)  222.04   222.2  224  BMI  36.95 kg/m2   36.98 kg/m2  37.28 kg/m2      Latest Ref Rng & Units 03/29/2021   12:00 AM 12/01/2020    8:00 AM  Foot/eye exam completion dates  Eye Exam No Retinopathy No Retinopathy       Foot Form Completion   Done     This result is from an external source.

## 2023-03-04 NOTE — Assessment & Plan Note (Signed)
Elevated at visit, compliant with meds, managed at [place] and commitment to daily physical activity for a minimum of 30 minutes discussed and encouraged, as a part of hypertension management. The importance of attaining a healthy weight is also discussed.     03/02/2023    8:58 AM 03/02/2023    8:31 AM 01/03/2023    8:21 AM 01/03/2023    8:16 AM 12/27/2022    8:37 AM 12/20/2022    3:27 PM 12/20/2022    3:24 PM  BP/Weight  Systolic BP 123456 0000000 [id number] 123456 [id number] 123456  Diastolic BP 96 84 99 99 90 76 77  Wt. (Lbs)  222.04   222.2  224  BMI  36.95 kg/m2   36.98 kg/m2  37.28 kg/m2

## 2023-03-06 ENCOUNTER — Encounter: Payer: Self-pay | Admitting: Family Medicine

## 2023-03-21 ENCOUNTER — Encounter (HOSPITAL_BASED_OUTPATIENT_CLINIC_OR_DEPARTMENT_OTHER): Payer: Self-pay | Admitting: Cardiovascular Disease

## 2023-03-21 ENCOUNTER — Ambulatory Visit (INDEPENDENT_AMBULATORY_CARE_PROVIDER_SITE_OTHER): Payer: Managed Care, Other (non HMO) | Admitting: Cardiovascular Disease

## 2023-03-21 VITALS — BP 163/99 | Ht 65.0 in | Wt 229.8 lb

## 2023-03-21 DIAGNOSIS — I1A0 Resistant hypertension: Secondary | ICD-10-CM

## 2023-03-21 DIAGNOSIS — I5042 Chronic combined systolic (congestive) and diastolic (congestive) heart failure: Secondary | ICD-10-CM

## 2023-03-21 DIAGNOSIS — N1832 Chronic kidney disease, stage 3b: Secondary | ICD-10-CM | POA: Diagnosis not present

## 2023-03-21 DIAGNOSIS — E119 Type 2 diabetes mellitus without complications: Secondary | ICD-10-CM

## 2023-03-21 DIAGNOSIS — I10 Essential (primary) hypertension: Secondary | ICD-10-CM

## 2023-03-21 DIAGNOSIS — Z6836 Body mass index (BMI) 36.0-36.9, adult: Secondary | ICD-10-CM

## 2023-03-21 MED ORDER — SACUBITRIL-VALSARTAN 97-103 MG PO TABS: 1.0000 | ORAL_TABLET | Freq: Two times a day (BID) | ORAL | 3 refills | Status: DC

## 2023-03-21 MED ORDER — SACUBITRIL-VALSARTAN 49-51 MG PO TABS: 1.0000 | ORAL_TABLET | Freq: Two times a day (BID) | ORAL | 3 refills | Status: DC

## 2023-03-21 NOTE — Telephone Encounter (Signed)
Patient seen in clinic today

## 2023-03-21 NOTE — Patient Instructions (Signed)
Medication Instructions:  INCREASE YOUR ENTRESTO TO 97-103 MG TWICE A DAY   Labwork: LP/CMET IN ABOUT 1 WEEK   Testing/Procedures: Your physician has requested that you have an echocardiogram. Echocardiography is a painless test that uses sound waves to create images of your heart. It provides your doctor with information about the size and shape of your heart and how well your heart's chambers and valves are working. This procedure takes approximately one hour. There are no restrictions for this procedure. Please do NOT wear cologne, perfume, aftershave, or lotions (deodorant is allowed). Please arrive 15 minutes prior to your appointment time.   Follow-Up: 05/10/2023 8:25 AM WITH CAITLIN W NP    If you need a refill on your cardiac medications before your next appointment, please call your pharmacy.

## 2023-03-21 NOTE — Progress Notes (Signed)
[facility]pert[facility] Follow up:    Date:  03/21/2023   ID:  Tracy Joseph, DOB 03/08/1971, MRN [id number]  PCP:  Tracy Helper, MD  Cardiologist:  Werner Lean, MD  Nephrologist:  Referring MD: Tracy Helper, MD   CC: Hypertension  History of Present Illness:    Tracy Joseph is a 52 y.o. female with a hx of hypertension, prediabetes, hyperlipidemia, CKD IIIb, and OSA, here to establish care in the [facility]. She saw Dr. Gasper Joseph in 2021 for malignant hypertension. At the time she was struggling with a lot of stress. Blood pressures were in the 200s/140s. He added spironolactone and referred her for a sleep study. She was also started on atorvastatin. Her kidney function worsened so spironolactone was stopped and she was started on labetalol. She followed up with our pharmacist and was working on lifestyle improvement. Isordil was added to her regimen. She was seen in the ED with chest pain 04/2022. BP was 220/127. BNP was 140 and high sensitivity troponin was mildly elevated but flat. She was treated with IV lasix and a nitroglycerin drip. Echo revealed LVEF 45-50% with severe LVH. The LV cavity was severely dilated.  She saw Dr. Dorris Joseph and was advised in lifestyle medicine. Labs were negative for hyperaldosteronism.  She is struggled with pretension since her early 31s and it has been consistently difficult to control.  She also has several family members with resistant hypertension.  At her appointment 09/2022 it was unclear why olmesartan had been discontinued.  She was started on Entresto.  She was referred to the prep program and enrolled in our remote patient monitoring study.  Renal Dopplers were negative 10/2022.  Labs for hyperaldosteronism were negative 04/2022.  She followed up with our pharmacist and her Entresto dose was increased.  At her visit 12/2022 blood pressure remained uncontrolled.  Clonidine patch was increased.  Lipid  panel revealed an ASCVD 10-year risk of 30.9% and rosuvastatin was added.   [name] reports maintaining an exercise regimen, engaging in physical activity three days a week. She notes that her blood pressure readings have been somewhat lower than usual, with recent measurements around 140/80 mmHg and a heart rate ranging from 70 to 80 beats per minute. She confirms that she has not experienced any issues related to allergies.  She acknowledges that she has run out of her Entresto medication and has not taken it for the past few days. [name] has implemented dietary changes, including the elimination of salt from her household, an increased intake of vegetables and fruits, and the avoidance of fried foods. She does not report any additional concerns or symptoms during this visit.  Previous antihypertensives: Ace inhibitors - cough Amlodipine - LE edema Benicar - cough, dry, pruritus Spironolactone-worsening renal function  Past Medical History:  Diagnosis Date   ACE-inhibitor cough    Chronic combined systolic and diastolic heart failure [id number]   New dx in 04/2022 during hospitalization. Severe LVH, grade2 diastolic dysfunction, EF 45 to 50 %   Chronic GERD    Essential hypertension    Goiter    Initially hyperthyroid treated with RAI, now hypothyroid - follows with Dr. Loanne Joseph   Helicobacter pylori ab+    Hypertension    Phreesia 10/12/2020   Hypothyroidism    Iron deficiency anemia    Mixed hyperlipidemia    Obstructive sleep apnea    Suspected    Prediabetes    Seasonal allergies  Tubular adenoma of colon     Past Surgical History:  Procedure Laterality Date   BIOPSY  07/25/2022   Procedure: BIOPSY;  Surgeon: Tracy Quale, MD;  [facility];  Service: Gastroenterology;;   COLONOSCOPY N/A 05/03/2017   Procedure: COLONOSCOPY;  Surgeon: Tracy Houston, MD;  [facility]: [facility];  Service: Endoscopy;  Laterality: N/A;  930   COLONOSCOPY  WITH PROPOFOL N/A 07/25/2022   Procedure: COLONOSCOPY WITH PROPOFOL;  Surgeon: Tracy Quale, MD;  [facility]: [facility];  Service: Gastroenterology;  Laterality: N/A;  1130 am   ESOPHAGOGASTRODUODENOSCOPY (EGD) WITH PROPOFOL N/A 07/25/2022   Procedure: ESOPHAGOGASTRODUODENOSCOPY (EGD) WITH PROPOFOL;  Surgeon: Tracy Quale, MD;  [facility]: [facility];  Service: Gastroenterology;  Laterality: N/A;   TUBAL LIGATION  2005    Current Medications: Current Meds  Medication Sig   amLODipine (NORVASC) 10 MG tablet Take 1 tablet (10 mg total) by mouth daily.   cloNIDine (CATAPRES - DOSED IN MG/24 HR) 0.3 mg/24hr patch Place 1 patch (0.3 mg total) onto the skin once a week.   ferrous sulfate 325 (65 FE) MG EC tablet Take 1 tablet (325 mg total) by mouth daily with breakfast.   furosemide (LASIX) 40 MG tablet Take 1 tablet (40 mg total) by mouth daily.   hydrALAZINE (APRESOLINE) 100 MG tablet Take 1 tablet (100 mg total) by mouth 3 (three) times daily.   isosorbide mononitrate (IMDUR) 60 MG 24 hr tablet Take 1 tablet (60 mg total) by mouth daily.   labetalol (NORMODYNE) 300 MG tablet Take 1 tablet (300 mg total) by mouth 2 (two) times daily. For BP   levothyroxine (SYNTHROID) 150 MCG tablet Take 1 tablet (150 mcg total) by mouth daily before breakfast.   pantoprazole (PROTONIX) 40 MG tablet TAKE 1 TABLET (40 MG TOTAL) BY MOUTH TWICE A DAY BEFORE MEALS   rosuvastatin (CRESTOR) 10 MG tablet Take 1 tablet (10 mg total) by mouth daily.   sacubitril-valsartan (ENTRESTO) 97-103 MG Take 1 tablet by mouth 2 (two) times daily.   vitamin B-12 (CYANOCOBALAMIN) 1000 MCG tablet Take 1 tablet (1,000 mcg total) by mouth daily.   Vitamin D, Ergocalciferol, (DRISDOL) 1.25 MG (50000 UNIT) CAPS capsule TAKE 1 CAPSULE (50,000 UNITS TOTAL) BY MOUTH ONCE A WEEK. ONE CAPSULE ONCE WEEKLY   [DISCONTINUED] sacubitril-valsartan (ENTRESTO) 49-51 MG Take 1 tablet by mouth 2 (two) times daily.      Allergies:   Ace inhibitors, Dust mite extract, Latex, and Pollen extract   Social History   Socioeconomic History   Marital status: Married    Spouse name: Not on file   Number of children: 1   Years of education: Not on file   Highest education level: Not on file  Occupational History   Occupation: full time student for [employer]  Tobacco Use   Smoking status: Never   Smokeless tobacco: Never  Vaping Use   Vaping Use: Never used  Substance and Sexual Activity   Alcohol use: No   Drug use: No   Sexual activity: Yes    Birth control/protection: None, Post-menopausal  Other Topics Concern   Not on file  Social History Narrative   Not on file   Social Determinants of Health   Financial Resource Strain: Low Risk  (09/27/2022)   Overall Financial Resource Strain (CARDIA)    Difficulty of Paying Living Expenses: Not hard at all  Food Insecurity: No Food Insecurity (09/27/2022)   Hunger Vital Sign  Worried About Charity fundraiser in the Last Year: Never true    [place] in the Last Year: Never true  Transportation Needs: No Transportation Needs (09/27/2022)   PRAPARE - Hydrologist (Medical): No    Lack of Transportation (Non-Medical): No  Physical Activity: Sufficiently Active (09/27/2022)   Exercise Vital Sign    Days of Exercise per Week: 3 days    Minutes of Exercise per Session: 60 min  Stress: No Stress Concern Present (09/27/2022)   [place]    Feeling of Stress : Not at all  Social Connections: [place] (09/27/2022)   Social Connection and Isolation Panel [NHANES]    Frequency of Communication with Friends and Family: Three times a week    Frequency of Social Gatherings with Friends and Family: Once a week    Attends Religious Services: More than 4 times per year    Active Member of [employer] or Organizations: Yes    Attends Archivist  Meetings: 1 to 4 times per year    Marital Status: Married     Family History: The patient's family history includes Coronary artery disease in her maternal aunt and maternal aunt; Hyperlipidemia in her mother; Hypertension in her cousin, maternal aunt, maternal aunt, and mother; Thyroid disease in her paternal grandmother.  ROS:   Please see the history of present illness.    All other systems reviewed and are negative.  EKGs/Labs/Other Studies Reviewed:    Echocardiogram  05/04/2022:  1. Left ventricular ejection fraction, by estimation, is 45 to 50%. The  left ventricle has mildly decreased function. The left ventricle has no  regional wall motion abnormalities. The left ventricular internal cavity  size was severely dilated. There is  severe left ventricular hypertrophy. Left ventricular diastolic parameters  are consistent with Grade II diastolic dysfunction (pseudonormalization).  Elevated left atrial pressure.   2. Right ventricular systolic function is normal. The right ventricular  size is normal.   3. Left atrial size was severely dilated.   4. Right atrial size was mildly dilated.   5. The mitral valve is normal in structure. Mild mitral valve  regurgitation.   6. The aortic valve is tricuspid. Aortic valve regurgitation is not  visualized. Aortic valve sclerosis is present, with no evidence of aortic  valve stenosis.   7. The inferior vena cava is normal in size with greater than 50%  respiratory variability, suggesting right atrial pressure of 3 mmHg.    EKG:  EKG is personally reviewed. 09/25/2022:  EKG was not ordered.   Recent Labs: 05/04/2022: B Natriuretic Peptide 140.0; Magnesium 2.1 07/20/2022: ALT 8 10/06/2022: TSH 0.035 12/13/2022: Hemoglobin 14.0; Platelets 259 01/03/2023: BUN 22; Creatinine, Ser 1.52; Potassium 4.5; Sodium 139   Recent Lipid Panel    Component Value Date/Time   CHOL 174 01/03/2023 0844   TRIG 92 01/03/2023 0844   HDL 36 (L)  01/03/2023 0844   CHOLHDL 4.8 (H) 01/03/2023 0844   CHOLHDL 4.7 09/16/2019 0847   VLDL 23 01/24/2017 0916   LDLCALC 121 (H) 01/03/2023 0844   LDLCALC 136 (H) 09/16/2019 0847    Physical Exam:    VS:  BP (!) 163/99 (BP Location: Right Arm, Patient Position: Sitting, Cuff Size: Large)   Ht 5\' 5"  (1.651 m)   Wt 229 lb 12.8 oz (104.2 kg)   BMI 38.24 kg/m  , BMI Body mass index is 38.24 kg/m.  GENERAL:  Well appearing HEENT: Pupils equal round and reactive, fundi not visualized, oral mucosa unremarkable NECK:  No jugular venous distention, waveform within normal limits, carotid upstroke brisk and symmetric, no bruits, no thyromegaly LUNGS:  Clear to auscultation bilaterally HEART:  RRR.  PMI not displaced or sustained,S1 and S2 within normal limits, no S3, no S4, no clicks, no rubs, 2/6 systolic murmur. ABD:  Flat, positive bowel sounds normal in frequency in pitch, no bruits, no rebound, no guarding, no midline pulsatile mass, no hepatomegaly, no splenomegaly EXT:  2 plus pulses throughout, no edema, no cyanosis no clubbing SKIN:  No rashes no nodules NEURO:  Cranial nerves II through XII grossly intact, motor grossly intact throughout PSYCH:  Cognitively intact, oriented to person place and time   ASSESSMENT/PLAN:    # Chronic systolic and diastolic heart failure: # Resistant HTN:  LVEF was 45-50% on echo 04/2022.  She is euvolemic and doing well.  Blood pressure remains uncontrolled.  Delene Loll is contributing positively to blood pressure management. - Adjust Entresto dose to 97/103 mg twice daily to further aid in heart failure management.  Continue with current regimen of amlodipine, clonidine, hydralazine, metoprolol, and labetalol. -Check lipids and MP in 2 weeks. - Arrange for a repeat echocardiogram to evaluate ejection fraction and heart muscle condition.  # Hypercholesterolemia: She will check fasting lipids and CMP.  Continue rosuvastatin.  # Lifestyle Modifications: The  patient has made dietary changes by eliminating salt and increasing fruits and vegetables. - Support the continuation of a low-sodium diet and regular physical activity.    Screening for Secondary Hypertension:     09/25/2022    9:13 AM 10/26/2022    6:34 AM  Causes  Drugs/Herbals Screened      - Comments One tea daily, trying to limit sodium, rare EtOH, no tobaccos use.  No NSAIDS.   Renovascular HTN Screened Screened     - Comments Check renal artery Dopplers no RAS  Sleep Apnea Screened      - Comments sleep study pending   Thyroid Disease Screened   Hyperaldosteronism Screened   Pheochromocytoma N/A   Cushing's Syndrome N/A   Hyperparathyroidism Screened   Coarctation of the Aorta Screened      - Comments BP symmetric   Compliance Screened     Relevant Labs/Studies:    Latest Ref Rng & Units 01/03/2023    8:45 AM 07/20/2022   11:44 AM 05/17/2022   10:23 AM  Basic Labs  Sodium 134 - 144 mmol/L 139  137  141   Potassium 3.5 - 5.2 mmol/L 4.5  3.6  4.0   Creatinine 0.57 - 1.00 mg/dL 1.52  1.26  1.44        Latest Ref Rng & Units 10/06/2022    4:54 PM 05/17/2022   10:23 AM  Thyroid   TSH 0.450 - 4.500 uIU/mL 0.035  2.640        Latest Ref Rng & Units 05/04/2022    8:38 AM  Renin/Aldosterone   Aldosterone 0.0 - 30.0 ng/dL 18.6              10/25/2022   10:58 AM  Renovascular   Renal Artery [place] Completed Yes    Disposition:    FU with APP/PharmD in 1-2 months  Medication Adjustments/Labs and Tests Ordered: Current medicines are reviewed at length with the patient today.  Concerns regarding medicines are outlined above.   Orders Placed This Encounter  Procedures   Lipid panel  Comprehensive metabolic panel   ECHOCARDIOGRAM COMPLETE   Meds ordered this encounter  Medications   DISCONTD: sacubitril-valsartan (ENTRESTO) 49-51 MG    Sig: Take 1 tablet by mouth 2 (two) times daily.    Dispense:  180 tablet    Refill:  3    INCREASED DOSE, D/C PREVIOUS RX    sacubitril-valsartan (ENTRESTO) 97-103 MG    Sig: Take 1 tablet by mouth 2 (two) times daily.    Dispense:  180 tablet    Refill:  3    NEW DOSE, D/C PREVIOUS RX     Signed, Skeet Latch, MD  03/21/2023 1:02 PM    [facility]

## 2023-03-22 ENCOUNTER — Telehealth (HOSPITAL_BASED_OUTPATIENT_CLINIC_OR_DEPARTMENT_OTHER): Payer: Self-pay | Admitting: Cardiovascular Disease

## 2023-03-22 NOTE — Telephone Encounter (Signed)
Left message for patient to call and discuss scheduling the Echocardiogram ordered by Dr. [place] 

## 2023-03-28 ENCOUNTER — Ambulatory Visit: Payer: Managed Care, Other (non HMO) | Admitting: Primary Care

## 2023-03-28 ENCOUNTER — Ambulatory Visit (INDEPENDENT_AMBULATORY_CARE_PROVIDER_SITE_OTHER): Payer: Managed Care, Other (non HMO) | Admitting: Gastroenterology

## 2023-03-28 ENCOUNTER — Encounter: Payer: Self-pay | Admitting: Gastroenterology

## 2023-03-28 VITALS — BP 175/98 | HR 75 | Temp 97.5°F | Ht 65.0 in | Wt 226.5 lb

## 2023-03-28 DIAGNOSIS — K219 Gastro-esophageal reflux disease without esophagitis: Secondary | ICD-10-CM

## 2023-03-28 DIAGNOSIS — K2970 Gastritis, unspecified, without bleeding: Secondary | ICD-10-CM | POA: Diagnosis not present

## 2023-03-28 DIAGNOSIS — B9681 Helicobacter pylori [H. pylori] as the cause of diseases classified elsewhere: Secondary | ICD-10-CM | POA: Diagnosis not present

## 2023-03-28 NOTE — Patient Instructions (Addendum)
Starting today, do not take pantoprazole. You will not take this for a total of 14 days. Then, on 3/24, you can go to [facility] in the morning for the breath test. This makes sure the bacteria is eradicated. Do not eat or drink anything after midnight the 23rd. You can take Pepcid or Zantac for reflux in the meantime.   We will see you in 6 months!    I have given you a handout for a reflux procedure to review. If you are interested, let me know!   It was a pleasure to see you today. I want to create trusting relationships with patients and provide genuine, compassionate, and quality care. I truly value your feedback, so please be on the lookout for a survey regarding your visit with me today. I appreciate your time in completing this!    Gelene Mink, PhD, ANP-BC [facility]

## 2023-03-28 NOTE — Telephone Encounter (Signed)
Left message for patient to call and discuss scheduling the Echocardiogram ordered by Dr. Bancroft 

## 2023-03-28 NOTE — Progress Notes (Signed)
Gastroenterology Office Note     Primary Care Physician:  Kerri Perches, MD  Primary Gastroenterologist: Dr. Marletta Lor   Chief Complaint   Chief Complaint  Patient presents with   Follow-up    Patient here today for a follow up . Patient denies any current gi issues. Protonix 40 mg bid helps with her gerd symptoms.      History of Present Illness   Tracy Joseph is a 52 y.o. female presenting today in follow-up with a history of Hep C antibody positive but negative PCR, chronic GERD, IDA, H.pylori positive in Aug 2023 s/p quad therapy.  She still needs documentation of H.pylori eradication. Breath test had been ordered at last visit but not completed yet. She will stop taking pantoprazole today in preparation for this.   GERD controlled overall on BID therapy. She does note breakthrough symptoms if laying down shortly after eating. Certain foods such as red sauces on pizza will trigger this. She still requires BID dosing. She has no other concerns today.    Last procedures:  EGD 07/25/2022 for GERD and iron deficiency anemia.  H.pylori gastritis s/p quad therapy.  Colonoscopy 07/25/2022 with 1 small polyp removed, benign pathology.   Past Medical History:  Diagnosis Date   ACE-inhibitor cough    Chronic combined systolic and diastolic heart failure 05/04/2022   New dx in 04/2022 during hospitalization. Severe LVH, grade2 diastolic dysfunction, EF 45 to 50 %   Chronic GERD    Essential hypertension    Goiter    Initially hyperthyroid treated with RAI, now hypothyroid - follows with Dr. Everardo All   Helicobacter pylori ab+    Hypertension    Phreesia 10/12/2020   Hypothyroidism    Iron deficiency anemia    Mixed hyperlipidemia    Obstructive sleep apnea    Suspected    Prediabetes    Seasonal allergies    Tubular adenoma of colon     Past Surgical History:  Procedure Laterality Date   BIOPSY  07/25/2022   Procedure: BIOPSY;  Surgeon: Dolores Frame,  MD;  [facility]: [facility];  Service: Gastroenterology;;   COLONOSCOPY N/A 05/03/2017   Procedure: COLONOSCOPY;  Surgeon: Malissa Hippo, MD;  Location: [facility];  Service: Endoscopy;  Laterality: N/A;  930   COLONOSCOPY WITH PROPOFOL N/A 07/25/2022   Procedure: COLONOSCOPY WITH PROPOFOL;  Surgeon: Dolores Frame, MD;  Location: [facility];  Service: Gastroenterology;  Laterality: N/A;  1130 am   ESOPHAGOGASTRODUODENOSCOPY (EGD) WITH PROPOFOL N/A 07/25/2022   Procedure: ESOPHAGOGASTRODUODENOSCOPY (EGD) WITH PROPOFOL;  Surgeon: Dolores Frame, MD;  Location: [facility];  Service: Gastroenterology;  Laterality: N/A;   TUBAL LIGATION  2005    Current Outpatient Medications  Medication Sig Dispense Refill   amLODipine (NORVASC) 10 MG tablet Take 1 tablet (10 mg total) by mouth daily. 90 tablet 3   cloNIDine (CATAPRES - DOSED IN MG/24 HR) 0.3 mg/24hr patch Place 1 patch (0.3 mg total) onto the skin once a week. 4 patch 12   ferrous sulfate 325 (65 FE) MG EC tablet Take 1 tablet (325 mg total) by mouth daily with breakfast. 90 tablet 3   furosemide (LASIX) 40 MG tablet Take 1 tablet (40 mg total) by mouth daily. 90 tablet 3   hydrALAZINE (APRESOLINE) 100 MG tablet Take 1 tablet (100 mg total) by mouth 3 (three) times daily. 270 tablet 2   isosorbide mononitrate (IMDUR) 60 MG 24 hr tablet Take 1  tablet (60 mg total) by mouth daily. 90 tablet 3   labetalol (NORMODYNE) 300 MG tablet Take 1 tablet (300 mg total) by mouth 2 (two) times daily. For BP 180 tablet 3   levothyroxine (SYNTHROID) 150 MCG tablet Take 1 tablet (150 mcg total) by mouth daily before breakfast. 90 tablet 1   norethindrone (AYGESTIN) 5 MG tablet Take 1 tablet (5 mg total) by mouth daily. 90 tablet 4   pantoprazole (PROTONIX) 40 MG tablet TAKE 1 TABLET (40 MG TOTAL) BY MOUTH TWICE A DAY BEFORE MEALS 180 tablet 1   rosuvastatin (CRESTOR) 10 MG tablet Take 1 tablet (10 mg total) by mouth daily. 90 tablet 1    sacubitril-valsartan (ENTRESTO) 97-103 MG Take 1 tablet by mouth 2 (two) times daily. 180 tablet 3   vitamin B-12 (CYANOCOBALAMIN) 1000 MCG tablet Take 1 tablet (1,000 mcg total) by mouth daily. 30 tablet 3   Vitamin D, Ergocalciferol, (DRISDOL) 1.25 MG (50000 UNIT) CAPS capsule TAKE 1 CAPSULE (50,000 UNITS TOTAL) BY MOUTH ONCE A WEEK. ONE CAPSULE ONCE WEEKLY 12 capsule 2   No current facility-administered medications for this visit.    Allergies as of 03/28/2023 - Review Complete 03/28/2023  Allergen Reaction Noted   Ace inhibitors Cough 10/14/2013   Dust mite extract  02/22/2011   Latex Itching 07/12/2022   Pollen extract  02/22/2011    Family History  Problem Relation Age of Onset   Hypertension Mother    Hyperlipidemia Mother    Hypertension Maternal Aunt    Coronary artery disease Maternal Aunt    Hypertension Maternal Aunt    Coronary artery disease Maternal Aunt    Thyroid disease Paternal Grandmother    Hypertension Cousin     Social History   Socioeconomic History   Marital status: Married    Spouse name: Not on file   Number of children: 1   Years of education: Not on file   Highest education level: Not on file  Occupational History   Occupation: full time Consulting civil engineerstudent for [employer]  Tobacco Use   Smoking status: Never   Smokeless tobacco: Never  Vaping Use   Vaping Use: Never used  Substance and Sexual Activity   Alcohol use: No   Drug use: No   Sexual activity: Yes    Birth control/protection: None, Post-menopausal  Other Topics Concern   Not on file  Social History Narrative   Not on file   Social Determinants of Health   Financial Resource Strain: Low Risk  (09/27/2022)   Overall Financial Resource Strain (CARDIA)    Difficulty of Paying Living Expenses: Not hard at all  Food Insecurity: No Food Insecurity (09/27/2022)   Hunger Vital Sign    Worried About Running Out of Food in the Last Year: Never true    Ran Out of Food in the Last Year: Never true   Transportation Needs: No Transportation Needs (09/27/2022)   PRAPARE - Administrator, Civil ServiceTransportation    Lack of Transportation (Medical): No    Lack of Transportation (Non-Medical): No  Physical Activity: Sufficiently Active (09/27/2022)   Exercise Vital Sign    Days of Exercise per Week: 3 days    Minutes of Exercise per Session: 60 min  Stress: No Stress Concern Present (09/27/2022)   [employer] - Occupational Stress Questionnaire    Feeling of Stress : Not at all  Social Connections: Socially Integrated (09/27/2022)   Social Connection and Isolation Panel [NHANES]    Frequency of Communication with Friends  and Family: Three times a week    Frequency of Social Gatherings with Friends and Family: Once a week    Attends Religious Services: More than 4 times per year    Active Member of [employer] or Organizations: Yes    Attends Banker Meetings: 1 to 4 times per year    Marital Status: Married  Catering manager Violence: Not At Risk (09/27/2022)   Humiliation, Afraid, Rape, and Kick questionnaire    Fear of Current or Ex-Partner: No    Emotionally Abused: No    Physically Abused: No    Sexually Abused: No     Review of Systems   Gen: Denies any fever, chills, fatigue, weight loss, lack of appetite.  CV: Denies chest pain, heart palpitations, peripheral edema, syncope.  Resp: Denies shortness of breath at rest or with exertion. Denies wheezing or cough.  GI: Denies dysphagia or odynophagia. Denies jaundice, hematemesis, fecal incontinence. GU : Denies urinary burning, urinary frequency, urinary hesitancy MS: Denies joint pain, muscle weakness, cramps, or limitation of movement.  Derm: Denies rash, itching, dry skin Psych: Denies depression, anxiety, memory loss, and confusion Heme: Denies bruising, bleeding, and enlarged lymph nodes.   Physical Exam   BP (!) 175/98 (BP Location: Right Arm, Patient Position: Sitting, Cuff Size: Large)   Pulse 75    Temp (!) 97.5 F (36.4 C) (Temporal)   Ht 5\' 5"  (1.651 m)   Wt 226 lb 8 oz (102.7 kg)   BMI 37.69 kg/m  General:   Alert and oriented. Pleasant and cooperative. Well-nourished and well-developed.  Head:  Normocephalic and atraumatic. Eyes:  Without icterus Abdomen:  +BS, soft, non-tender and non-distended. No HSM noted. No guarding or rebound. No masses appreciated.  Rectal:  Deferred  Msk:  Symmetrical without gross deformities. Normal posture. Extremities:  Without edema. Neurologic:  Alert and  oriented x4;  grossly normal neurologically. Skin:  Intact without significant lesions or rashes. Psych:  Alert and cooperative. Normal mood and affect.   Assessment   Tracy Joseph is a 52 y.o. female presenting today in follow-up with a history of Hep C antibody positive but negative PCR, chronic GERD, IDA, H.pylori positive in Aug 2023 s/p quad therapy.  GERD: on BID PPI dosing. Breakthrough symptoms related to diet/behavior. GERD food plan provided. TIF procedure pamphlet provided to review. Uncertain if she would be a candidate, but this may need to be considered in future.   H.pylori gastritis: needs documented eradication. Stop PPI today. Will do breath test on 3/24, then resume PPI.      PLAN   Hold PPI dosing starting today Urea breath test on 3/24, then can resume BID PPI TIF procedure pamphlet provided GERD diet Return in 6 months.    Gelene Mink, PhD, ANP-BC [facility]

## 2023-04-11 ENCOUNTER — Ambulatory Visit: Payer: Managed Care, Other (non HMO) | Admitting: "Endocrinology

## 2023-04-11 LAB — COMPREHENSIVE METABOLIC PANEL
ALT: 7 IU/L (ref 0–32)
AST: 12 IU/L (ref 0–40)
Albumin/Globulin Ratio: 1.5 (ref 1.2–2.2)
Albumin: 4.1 g/dL (ref 3.8–4.9)
Alkaline Phosphatase: 87 IU/L (ref 44–121)
BUN/Creatinine Ratio: 10 (ref 9–23)
BUN: 15 mg/dL (ref 6–24)
Bilirubin Total: 0.5 mg/dL (ref 0.0–1.2)
CO2: 19 mmol/L — ABNORMAL LOW (ref 20–29)
Calcium: 9.8 mg/dL (ref 8.7–10.2)
Chloride: 105 mmol/L (ref 96–106)
Creatinine, Ser: 1.43 mg/dL — ABNORMAL HIGH (ref 0.57–1.00)
Globulin, Total: 2.8 g/dL (ref 1.5–4.5)
Glucose: 88 mg/dL (ref 70–99)
Potassium: 3.9 mmol/L (ref 3.5–5.2)
Sodium: 141 mmol/L (ref 134–144)
Total Protein: 6.9 g/dL (ref 6.0–8.5)
eGFR: 44 mL/min/{1.73_m2} — ABNORMAL LOW (ref >59)

## 2023-04-11 LAB — LIPID PANEL
Chol/HDL Ratio: 2.6 ratio (ref 0.0–4.4)
Cholesterol, Total: 111 mg/dL (ref 100–199)
HDL: 43 mg/dL (ref >39)
LDL Chol Calc (NIH): 51 mg/dL (ref 0–99)
Triglycerides: 88 mg/dL (ref 0–149)
VLDL Cholesterol Cal: 17 mg/dL (ref 5–40)

## 2023-04-12 LAB — H. PYLORI BREATH TEST: H pylori Breath Test: NEGATIVE

## 2023-04-15 ENCOUNTER — Other Ambulatory Visit: Payer: Self-pay | Admitting: Family Medicine

## 2023-04-25 ENCOUNTER — Ambulatory Visit: Payer: Managed Care, Other (non HMO) | Admitting: Primary Care

## 2023-04-25 ENCOUNTER — Encounter: Payer: Self-pay | Admitting: Primary Care

## 2023-04-25 VITALS — BP 144/90 | HR 70 | Temp 98.8°F | Ht 65.0 in | Wt 226.2 lb

## 2023-04-25 DIAGNOSIS — R0683 Snoring: Secondary | ICD-10-CM | POA: Diagnosis not present

## 2023-04-25 DIAGNOSIS — I10 Essential (primary) hypertension: Secondary | ICD-10-CM | POA: Diagnosis not present

## 2023-04-25 DIAGNOSIS — G4730 Sleep apnea, unspecified: Secondary | ICD-10-CM

## 2023-04-25 NOTE — Patient Instructions (Addendum)
Sleep study showed that you have mild obstructive sleep apnea, you had on average 11 apneic events per hour with a low oxygen level of 77%  Due to your cardiac history I do recommend that you be started on auto CPAP  Recommendations  Aim to wear CPAP nightly for 4 to 6 hours or longer Work on weight loss efforts as able Do not drive if experiencing excessive daytime sleepiness fatigue Focus on side sleeping position  Follow-up 2 months with Waynetta Sandy NP for CPAP compliance check  CPAP and BIPAP Information CPAP and BIPAP are methods that use air pressure to keep your airways open and to help you breathe well. CPAP and BIPAP use different amounts of pressure. Your health care provider will tell you whether CPAP or BIPAP would be more helpful for you. CPAP stands for "continuous positive airway pressure." With CPAP, the amount of pressure stays the same while you breathe in (inhale) and out (exhale). BIPAP stands for "bi-level positive airway pressure." With BIPAP, the amount of pressure will be higher when you inhale and lower when you exhale. This allows you to take larger breaths. CPAP or BIPAP may be used in the hospital, or your health care provider may want you to use it at home. You may need to have a sleep study before your health care provider can order a machine for you to use at home. What are the advantages? CPAP or BIPAP can be helpful if you have: Sleep apnea. Chronic obstructive pulmonary disease (COPD). Heart failure. Medical conditions that cause muscle weakness, including muscular dystrophy or amyotrophic lateral sclerosis (ALS). Other problems that cause breathing to be shallow, weak, abnormal, or difficult. CPAP and BIPAP are most commonly used for obstructive sleep apnea (OSA) to keep the airways from collapsing when the muscles relax during sleep. What are the risks? Generally, this is a safe treatment. However, problems may occur, including: Irritated skin or skin sores if  the mask does not fit properly. Dry or stuffy nose or nosebleeds. Dry mouth. Feeling gassy or bloated. Sinus or lung infection if the equipment is not cleaned properly. When should CPAP or BIPAP be used? In most cases, the mask only needs to be worn during sleep. Generally, the mask needs to be worn throughout the night and during any daytime naps. People with certain medical conditions may also need to wear the mask at other times, such as when they are awake. Follow instructions from your health care provider about when to use the machine. What happens during CPAP or BIPAP?  Both CPAP and BIPAP are provided by a small machine with a flexible plastic tube that attaches to a plastic mask that you wear. Air is blown through the mask into your nose or mouth. The amount of pressure that is used to blow the air can be adjusted on the machine. Your health care provider will set the pressure setting and help you find the best mask for you. Tips for using the mask Because the mask needs to be snug, some people feel trapped or closed-in (claustrophobic) when first using the mask. If you feel this way, you may need to get used to the mask. One way to do this is to hold the mask loosely over your nose or mouth and then gradually apply the mask more snugly. You can also gradually increase the amount of time that you use the mask. Masks are available in various types and sizes. If your mask does not fit well, talk with  your health care provider about getting a different one. Some common types of masks include: Full face masks, which fit over the mouth and nose. Nasal masks, which fit over the nose. Nasal pillow or prong masks, which fit into the nostrils. If you are using a mask that fits over your nose and you tend to breathe through your mouth, a chin strap may be applied to help keep your mouth closed. Use a skin barrier to protect your skin as told by your health care provider. Some CPAP and BIPAP machines  have alarms that may sound if the mask comes off or develops a leak. If you have trouble with the mask, it is very important that you talk with your health care provider about finding a way to make the mask easier to tolerate. Do not stop using the mask. There could be a negative impact on your health if you stop using the mask. Tips for using the machine Place your CPAP or BIPAP machine on a secure table or stand near an electrical outlet. Know where the on/off switch is on the machine. Follow instructions from your health care provider about how to set the pressure on your machine and when you should use it. Do not eat or drink while the CPAP or BIPAP machine is on. Food or fluids could get pushed into your lungs by the pressure of the CPAP or BIPAP. For home use, CPAP and BIPAP machines can be rented or purchased through home health care companies. Many different brands of machines are available. Renting a machine before purchasing may help you find out which particular machine works well for you. Your health insurance company may also decide which machine you may get. Keep the CPAP or BIPAP machine and attachments clean. Ask your health care provider for specific instructions. Check the humidifier if you have a dry stuffy nose or nosebleeds. Make sure it is working correctly. Follow these instructions at home: Take over-the-counter and prescription medicines only as told by your health care provider. Ask if you can take sinus medicine if your sinuses are blocked. Do not use any products that contain nicotine or tobacco. These products include cigarettes, chewing tobacco, and vaping devices, such as e-cigarettes. If you need help quitting, ask your health care provider. Keep all follow-up visits. This is important. Contact a health care provider if: You have redness or pressure sores on your head, face, mouth, or nose from the mask or head gear. You have trouble using the CPAP or BIPAP  machine. You cannot tolerate wearing the CPAP or BIPAP mask. Someone tells you that you snore even when wearing your CPAP or BIPAP. Get help right away if: You have trouble breathing. You feel confused. Summary CPAP and BIPAP are methods that use air pressure to keep your airways open and to help you breathe well. If you have trouble with the mask, it is very important that you talk with your health care provider about finding a way to make the mask easier to tolerate. Do not stop using the mask. There could be a negative impact to your health if you stop using the mask. Follow instructions from your health care provider about when to use the machine. This information is not intended to replace advice given to you by your health care provider. Make sure you discuss any questions you have with your health care provider. Document Revised: 07/13/2021 Document Reviewed: 11/12/2020 Elsevier Patient Education  2023 [employer].

## 2023-04-25 NOTE — Progress Notes (Signed)
@Patient  ID: Tracy Joseph, female    DOB: April 14, 1971, 52 y.o.   MRN: [id number]  Chief Complaint  Patient presents with   Consult    Review HST from 02/05/2023.  Dr Craige Cotta     Referring provider: Kerri Perches, MD  HPI: 52 year old female, ever smoked.  Past medical history significant for systolic and diastolic heart failure, hypertension, GERD, hypothyroidism, chronic kidney disease, obesity.  04/25/2023 Patient presents today to review sleep study. She has symptoms of snoring and sleep disruption. Hx heart failure and hypertension. Blood pressure today was 150/100. She did not take her medication today. Patient had a home sleep study on 02/05/2023 that showed mild OSA, AHI 11/hour SpO2 low 77% (average 93%). We reviewed sleep study results and treatment options. She is agreeing to starting CPAP.    Allergies  Allergen Reactions   Ace Inhibitors Cough   Dust Mite Extract    Latex Itching   Pollen Extract     Immunization History  Administered Date(s) Administered   Influenza,inj,Quad PF,6+ Mos 10/12/2020, 11/08/2021   PFIZER(Purple Top)SARS-COV-2 Vaccination 03/25/2020, 04/23/2020, 11/23/2020   Pneumococcal Polysaccharide-23 12/22/2020   Tdap 04/06/2015    Past Medical History:  Diagnosis Date   ACE-inhibitor cough    Chronic combined systolic and diastolic heart failure (HCC) 05/04/2022   New dx in 04/2022 during hospitalization. Severe LVH, grade2 diastolic dysfunction, EF 45 to 50 %   Chronic GERD    Essential hypertension    Goiter    Initially hyperthyroid treated with RAI, now hypothyroid - follows with Dr. Everardo All   Helicobacter pylori ab+    Hypertension    Phreesia 10/12/2020   Hypothyroidism    Iron deficiency anemia    Mixed hyperlipidemia    Obstructive sleep apnea    Suspected    Prediabetes    Seasonal allergies    Tubular adenoma of colon     Tobacco History: Social History   Tobacco Use  Smoking Status Never  Smokeless Tobacco Never    Counseling given: Not Answered   Outpatient Medications Prior to Visit  Medication Sig Dispense Refill   amLODipine (NORVASC) 10 MG tablet Take 1 tablet (10 mg total) by mouth daily. 90 tablet 3   cloNIDine (CATAPRES - DOSED IN MG/24 HR) 0.3 mg/24hr patch Place 1 patch (0.3 mg total) onto the skin once a week. 4 patch 12   ferrous sulfate 325 (65 FE) MG EC tablet Take 1 tablet (325 mg total) by mouth daily with breakfast. 90 tablet 3   furosemide (LASIX) 40 MG tablet Take 1 tablet (40 mg total) by mouth daily. 90 tablet 3   hydrALAZINE (APRESOLINE) 100 MG tablet TAKE 1 TABLET BY MOUTH TWICE A DAY 180 tablet 2   isosorbide mononitrate (IMDUR) 60 MG 24 hr tablet Take 1 tablet (60 mg total) by mouth daily. 90 tablet 3   labetalol (NORMODYNE) 300 MG tablet Take 1 tablet (300 mg total) by mouth 2 (two) times daily. For BP 180 tablet 3   levothyroxine (SYNTHROID) 175 MCG tablet Take 175 mcg by mouth daily before breakfast.     pantoprazole (PROTONIX) 40 MG tablet TAKE 1 TABLET (40 MG TOTAL) BY MOUTH TWICE A DAY BEFORE MEALS 180 tablet 1   rosuvastatin (CRESTOR) 10 MG tablet Take 1 tablet (10 mg total) by mouth daily. 90 tablet 1   sacubitril-valsartan (ENTRESTO) 97-103 MG Take 1 tablet by mouth 2 (two) times daily. 180 tablet 3   vitamin B-12 (CYANOCOBALAMIN) 1000 MCG  tablet Take 1 tablet (1,000 mcg total) by mouth daily. 30 tablet 3   Vitamin D, Ergocalciferol, (DRISDOL) 1.25 MG (50000 UNIT) CAPS capsule TAKE 1 CAPSULE (50,000 UNITS TOTAL) BY MOUTH ONCE A WEEK. ONE CAPSULE ONCE WEEKLY 12 capsule 2   norethindrone (AYGESTIN) 5 MG tablet Take 1 tablet (5 mg total) by mouth daily. 90 tablet 4   levothyroxine (SYNTHROID) 150 MCG tablet Take 1 tablet (150 mcg total) by mouth daily before breakfast. 90 tablet 1   No facility-administered medications prior to visit.   Review of Systems  Review of Systems  Constitutional: Negative.   Respiratory: Negative.    Cardiovascular: Negative.     Physical Exam  BP (!) 150/100 (BP Location: Left Arm, Patient Position: Sitting, Cuff Size: Large)   Pulse 70   Temp 98.8 F (37.1 C) (Oral)   Ht 5\' 5"  (1.651 m)   Wt 226 lb 3.2 oz (102.6 kg)   SpO2 96%   BMI 37.64 kg/m  Physical Exam Constitutional:      General: She is not in acute distress.    Appearance: Normal appearance. She is obese. She is not ill-appearing.  HENT:     Head: Normocephalic and atraumatic.  Cardiovascular:     Rate and Rhythm: Normal rate and regular rhythm.  Pulmonary:     Effort: Pulmonary effort is normal.     Breath sounds: Normal breath sounds.  Musculoskeletal:        General: Normal range of motion.  Skin:    General: Skin is warm and dry.  Neurological:     General: No focal deficit present.     Mental Status: She is alert and oriented to person, place, and time. Mental status is at baseline.  Psychiatric:        Mood and Affect: Mood normal.        Behavior: Behavior normal.        Thought Content: Thought content normal.        Judgment: Judgment normal.      Lab Results:  CBC    Component Value Date/Time   WBC 5.9 12/13/2022 0816   RBC 4.20 12/13/2022 0816   HGB 14.0 12/13/2022 0816   HGB 9.7 (L) 03/08/2022 0930   HCT 41.7 12/13/2022 0816   HCT 32.0 (L) 03/08/2022 0930   PLT 259 12/13/2022 0816   PLT 339 03/08/2022 0930   MCV 99.3 12/13/2022 0816   MCV 77 (L) 03/08/2022 0930   MCH 33.3 12/13/2022 0816   MCHC 33.6 12/13/2022 0816   RDW 13.8 12/13/2022 0816   RDW 15.8 (H) 03/08/2022 0930   LYMPHSABS 1.3 12/13/2022 0816   LYMPHSABS 1.4 04/26/2021 0937   MONOABS 0.6 12/13/2022 0816   EOSABS 0.1 12/13/2022 0816   EOSABS 0.1 04/26/2021 0937   BASOSABS 0.0 12/13/2022 0816   BASOSABS 0.0 04/26/2021 0937    BMET    Component Value Date/Time   NA 141 04/10/2023 0842   K 3.9 04/10/2023 0842   CL 105 04/10/2023 0842   CO2 19 (L) 04/10/2023 0842   GLUCOSE 88 04/10/2023 0842   GLUCOSE 84 07/20/2022 1144   BUN 15  04/10/2023 0842   CREATININE 1.43 (H) 04/10/2023 0842   CREATININE 1.14 (H) 09/16/2019 0847   CALCIUM 9.8 04/10/2023 0842   GFRNONAA 51 (L) 07/20/2022 1144   GFRNONAA 56 (L) 09/16/2019 0847   GFRAA 53 (L) 11/10/2020 0938   GFRAA 65 09/16/2019 0847    BNP    Component Value  Date/Time   BNP 140.0 (H) 05/04/2022 0437    ProBNP No results found for: "PROBNP"  Imaging: No results found.   Assessment & Plan:   Sleep apnea - Patient has symptoms of snoring and disrupted sleep.  Home sleep study showed mild OSA, AHI 11/h with SpO2 low 77%.  We reviewed sleep study results and treatment options.  Recommending patient be started on CPAP due to cardiac history, patient in agreement.  Will place an order for patient to be started on auto CPAP 5 to 15 cm H2O with mask of choice.  Encouraged hide sleeping position and weight loss.  Advised patient aim to wear CPAP nightly for 4 to 6 hours or longer.  Follow-up in 31 to 90 days for CPAP compliance.  Malignant hypertension - Chronic issue; Blood pressure 150/100, patient did not take blood pressure medication today and advised to do so.  Patient to follow-up with primary care.    Glenford Bayley, NP 04/25/2023

## 2023-04-25 NOTE — Assessment & Plan Note (Signed)
-   Patient has symptoms of snoring and disrupted sleep.  Home sleep study showed mild OSA, AHI 11/h with SpO2 low 77%.  We reviewed sleep study results and treatment options.  Recommending patient be started on CPAP due to cardiac history, patient in agreement.  Will place an order for patient to be started on auto CPAP 5 to 15 cm H2O with mask of choice.  Encouraged hide sleeping position and weight loss.  Advised patient aim to wear CPAP nightly for 4 to 6 hours or longer.  Follow-up in 31 to 90 days for CPAP compliance.

## 2023-04-25 NOTE — Assessment & Plan Note (Signed)
-   Chronic issue; Blood pressure 150/100, patient did not take blood pressure medication today and advised to do so.  Patient to follow-up with primary care.

## 2023-04-26 NOTE — Progress Notes (Signed)
Reviewed and agree with assessment/plan.   Coralyn Helling, MD [facility] Pulmonary/Critical Care 04/26/2023, 10:49 AM Pager:  [phone]

## 2023-05-02 ENCOUNTER — Ambulatory Visit (INDEPENDENT_AMBULATORY_CARE_PROVIDER_SITE_OTHER): Payer: Managed Care, Other (non HMO)

## 2023-05-02 ENCOUNTER — Telehealth (HOSPITAL_BASED_OUTPATIENT_CLINIC_OR_DEPARTMENT_OTHER): Payer: Self-pay | Admitting: *Deleted

## 2023-05-02 DIAGNOSIS — I5042 Chronic combined systolic (congestive) and diastolic (congestive) heart failure: Secondary | ICD-10-CM | POA: Diagnosis not present

## 2023-05-02 DIAGNOSIS — I5030 Unspecified diastolic (congestive) heart failure: Secondary | ICD-10-CM | POA: Diagnosis not present

## 2023-05-02 DIAGNOSIS — I517 Cardiomegaly: Secondary | ICD-10-CM

## 2023-05-02 LAB — ECHOCARDIOGRAM COMPLETE
Area-P 1/2: 3.17 cm2
S' Lateral: 2.6 cm

## 2023-05-02 NOTE — Telephone Encounter (Signed)
Patient in for Echo, blood pressure elevated  202/120 initially, after study 198/123 & 190/107 No am medications this am, asymptomatic  Discussed with Dr Rennis Golden, ok for patient to go take meds and have rechecked by nurse at work Advised patient, verbalized understanding

## 2023-05-02 NOTE — Progress Notes (Signed)
Patient's blood pressure elevated at 202/120, 198/123 and 190/107. Patient asymptomatic. Per Dr. Rennis Golden patient okay to leave. Instructed to take medicine and check pressure through out the day.  Jeryl Columbia, RDCS, RVT

## 2023-05-03 ENCOUNTER — Other Ambulatory Visit: Payer: Self-pay | Admitting: Family Medicine

## 2023-05-03 NOTE — Telephone Encounter (Signed)
Dr Wood made aware  

## 2023-05-10 ENCOUNTER — Ambulatory Visit (HOSPITAL_BASED_OUTPATIENT_CLINIC_OR_DEPARTMENT_OTHER): Payer: Managed Care, Other (non HMO) | Admitting: Family

## 2023-05-10 ENCOUNTER — Encounter (HOSPITAL_BASED_OUTPATIENT_CLINIC_OR_DEPARTMENT_OTHER): Payer: Self-pay | Admitting: Family

## 2023-05-10 VITALS — BP 162/94 | HR 80 | Ht 65.0 in | Wt 227.0 lb

## 2023-05-10 DIAGNOSIS — E782 Mixed hyperlipidemia: Secondary | ICD-10-CM

## 2023-05-10 DIAGNOSIS — G4733 Obstructive sleep apnea (adult) (pediatric): Secondary | ICD-10-CM | POA: Diagnosis not present

## 2023-05-10 DIAGNOSIS — I5042 Chronic combined systolic (congestive) and diastolic (congestive) heart failure: Secondary | ICD-10-CM | POA: Diagnosis not present

## 2023-05-10 DIAGNOSIS — I1A0 Resistant hypertension: Secondary | ICD-10-CM

## 2023-05-10 MED ORDER — AMLODIPINE BESYLATE 10 MG PO TABS
10.0000 mg | ORAL_TABLET | Freq: Every day | ORAL | 3 refills | Status: DC
Start: 2023-05-10 — End: 2024-10-02

## 2023-05-10 MED ORDER — ISOSORBIDE MONONITRATE ER 60 MG PO TB24
60.0000 mg | ORAL_TABLET | Freq: Every day | ORAL | 3 refills | Status: DC
Start: 2023-05-10 — End: 2024-06-17

## 2023-05-10 MED ORDER — LABETALOL HCL 300 MG PO TABS
300.0000 mg | ORAL_TABLET | Freq: Two times a day (BID) | ORAL | 3 refills | Status: DC
Start: 2023-05-10 — End: 2024-06-17

## 2023-05-10 MED ORDER — FUROSEMIDE 40 MG PO TABS
40.0000 mg | ORAL_TABLET | Freq: Every day | ORAL | 3 refills | Status: DC
Start: 2023-05-10 — End: 2024-06-17

## 2023-05-10 NOTE — Progress Notes (Signed)
Advanced Hypertension Clinic Assessment:    Date:  05/10/2023   ID:  Tracy Joseph, DOB 02-25-1971, MRN [id number]  PCP:  Kerri Perches, MD  Cardiologist:  Christell Constant, MD  Nephrologist:  Referring MD: Kerri Perches, MD   CC: Hypertension  History of Present Illness:    Tracy Joseph is a 52 y.o. female with a hx of hypertension, prediabetes, hyperlipidemia, CKD 3B, OSA here to follow up in the [facility].   Saw Dr. Raynelle Jan 2021 for malignant hypertension with BP in the 200s over 140s in the setting of stress.  Diagnosed with hypertension in her 30s with strong family history of hypertension.  Spironolactone added and referred for sleep study. Kidney function worsened so spironolactone discontinued and labetalol initiated.  At follow-up with pharmacy team Isordil added.  ED visit 04/2022 with chest pain with BP 220/127 she does IV Lasix and nitroglycerin drip.  Echo LVEF 45 to 50%, severe LVH, LV cavity severely dilated.  She saw Dr. Fransico Him and was advised on lifestyle medicine.  Workup negative for hyperaldosteronism.  At visit 09/2022 it was unclear why olmesartan was discontinued and she was subsequently started on Entresto.  Renal Dopplers - 10/2022.  Entresto later increased.  At visit 1/24 blood pressure remained uncontrolled and clonidine patch was increased.  Lipid panel with subsequent 10-year ASCVD risk score of 30.9% rosuvastatin initiated.  Last seen 03/21/2023.  She was maintaining exercise regimen with recent readings 140/80 and heart rate 70-80 bpm.  She was out of her Entresto and had not taken for a few days.  [name] was further increased to 97/23 mg twice daily.  Amlodipine, clonidine, hydralazine, metoprolol, labetalol were continued.  She was recommended for repeat echocardiogram performed 05/02/2023 with LVEF normalized to 66 5%, severe LVH, grade 1 diastolic dysfunction.  She presents today for follow-up. Feeling overall  well since last seen. Notes blood pressure has been higher over the last few weeks. Yesterday 170s/110s - does note more stress. It has been 160-170s over the last few weeks. Prior to that it was 130s-140s. Does note she was out of Entresto (just refilled Sunday). Has continued to exercise regularly. Did have two week gap in exercise though was doing yard work.   Previous antihypertensives: Ace inhibitors - cough Amlodipine - LE edema Benicar - cough, dry, pruritus Spironolactone-worsening renal function  Past Medical History:  Diagnosis Date   ACE-inhibitor cough    Chronic combined systolic and diastolic heart failure (HCC) 05/04/2022   New dx in 04/2022 during hospitalization. Severe LVH, grade2 diastolic dysfunction, EF 45 to 50 %   Chronic GERD    Essential hypertension    Goiter    Initially hyperthyroid treated with RAI, now hypothyroid - follows with Dr. Everardo All   Helicobacter pylori ab+    Hypertension    Phreesia 10/12/2020   Hypothyroidism    Iron deficiency anemia    Mixed hyperlipidemia    Obstructive sleep apnea    Suspected    Prediabetes    Seasonal allergies    Tubular adenoma of colon     Past Surgical History:  Procedure Laterality Date   BIOPSY  07/25/2022   Procedure: BIOPSY;  Surgeon: Dolores Frame, MD;  [facility];  Service: Gastroenterology;;   COLONOSCOPY N/A 05/03/2017   Procedure: COLONOSCOPY;  Surgeon: Malissa Hippo, MD;  Location: [facility];  Service: Endoscopy;  Laterality: N/A;  930   COLONOSCOPY WITH PROPOFOL N/A 07/25/2022  Procedure: COLONOSCOPY WITH PROPOFOL;  Surgeon: Dolores Frame, MD;  Location: [facility];  Service: Gastroenterology;  Laterality: N/A;  1130 am   ESOPHAGOGASTRODUODENOSCOPY (EGD) WITH PROPOFOL N/A 07/25/2022   Procedure: ESOPHAGOGASTRODUODENOSCOPY (EGD) WITH PROPOFOL;  Surgeon: Dolores Frame, MD;  Location: [facility];  Service: Gastroenterology;  Laterality: N/A;    TUBAL LIGATION  2005    Current Medications: Current Meds  Medication Sig   cloNIDine (CATAPRES - DOSED IN MG/24 HR) 0.3 mg/24hr patch Place 1 patch (0.3 mg total) onto the skin once a week.   ferrous sulfate 325 (65 FE) MG EC tablet Take 1 tablet (325 mg total) by mouth daily with breakfast.   hydrALAZINE (APRESOLINE) 100 MG tablet TAKE 1 TABLET BY MOUTH TWICE A DAY   levothyroxine (SYNTHROID) 175 MCG tablet TAKE ONE AND AN HALF TABLETS ONCE DAILY EVERY MONDAY, WEDNESDAY AND FRIDAY, THEN TAKE ONE TABLET ONCE DAILY, EVERY TUESDAY, THURSDAY, SATURDAY AND SUNDAY   pantoprazole (PROTONIX) 40 MG tablet TAKE 1 TABLET (40 MG TOTAL) BY MOUTH TWICE A DAY BEFORE MEALS   rosuvastatin (CRESTOR) 10 MG tablet Take 1 tablet (10 mg total) by mouth daily.   sacubitril-valsartan (ENTRESTO) 97-103 MG Take 1 tablet by mouth 2 (two) times daily.   vitamin B-12 (CYANOCOBALAMIN) 1000 MCG tablet Take 1 tablet (1,000 mcg total) by mouth daily.   Vitamin D, Ergocalciferol, (DRISDOL) 1.25 MG (50000 UNIT) CAPS capsule TAKE 1 CAPSULE (50,000 UNITS TOTAL) BY MOUTH ONCE A WEEK. ONE CAPSULE ONCE WEEKLY   [DISCONTINUED] amLODipine (NORVASC) 10 MG tablet Take 1 tablet (10 mg total) by mouth daily.   [DISCONTINUED] isosorbide mononitrate (IMDUR) 60 MG 24 hr tablet Take 1 tablet (60 mg total) by mouth daily.   [DISCONTINUED] labetalol (NORMODYNE) 300 MG tablet Take 1 tablet (300 mg total) by mouth 2 (two) times daily. For BP     Allergies:   Ace inhibitors, Dust mite extract, Latex, and Pollen extract   Social History   Socioeconomic History   Marital status: Married    Spouse name: Not on file   Number of children: 1   Years of education: Not on file   Highest education level: Not on file  Occupational History   Occupation: full time student for [employer]  Tobacco Use   Smoking status: Never   Smokeless tobacco: Never  Vaping Use   Vaping Use: Never used  Substance and Sexual Activity   Alcohol use: No   Drug use: No    Sexual activity: Yes    Birth control/protection: None, Post-menopausal  Other Topics Concern   Not on file  Social History Narrative   Not on file   Social Determinants of Health   Financial Resource Strain: Low Risk  (09/27/2022)   Overall Financial Resource Strain (CARDIA)    Difficulty of Paying Living Expenses: Not hard at all  Food Insecurity: No Food Insecurity (09/27/2022)   Hunger Vital Sign    Worried About Running Out of Food in the Last Year: Never true    Ran Out of Food in the Last Year: Never true  Transportation Needs: No Transportation Needs (09/27/2022)   PRAPARE - Administrator, Civil Service (Medical): No    Lack of Transportation (Non-Medical): No  Physical Activity: Sufficiently Active (09/27/2022)   Exercise Vital Sign    Days of Exercise per Week: 3 days    Minutes of Exercise per Session: 60 min  Stress: No Stress Concern Present (09/27/2022)   [place]  Institute of Occupational Health - Occupational Stress Questionnaire    Feeling of Stress : Not at all  Social Connections: Socially Integrated (09/27/2022)   Social Connection and Isolation Panel [NHANES]    Frequency of Communication with Friends and Family: Three times a week    Frequency of Social Gatherings with Friends and Family: Once a week    Attends Religious Services: More than 4 times per year    Active Member of [employer] or Organizations: Yes    Attends Banker Meetings: 1 to 4 times per year    Marital Status: Married     Family History: The patient's family history includes Coronary artery disease in her maternal aunt and maternal aunt; Hyperlipidemia in her mother; Hypertension in her cousin, maternal aunt, maternal aunt, and mother; Thyroid disease in her paternal grandmother.  ROS:   Please see the history of present illness.     All other systems reviewed and are negative.  EKGs/Labs/Other Studies Reviewed:    EKG:  EKG is not ordered today.    Cardiac  Studies & Procedures       ECHOCARDIOGRAM  ECHOCARDIOGRAM COMPLETE 05/02/2023  Narrative ECHOCARDIOGRAM REPORT    Patient Name:   Tracy Joseph Date of Exam: 05/02/2023 Medical Rec #:  [id number]        Height:       65.0 in Accession #:    [id number]       Weight:       226.2 lb Date of Birth:  09-May-1971         BSA:          2.084 m Patient Age:    52 years         BP:           190/107 mmHg Patient Gender: F                HR:           60 bpm. Exam Location:  Outpatient  Procedure: 2D Echo, 3D Echo, Color Doppler, Cardiac Doppler and Strain Analysis  Indications:    Chronic combined systolic and diastolic  History:        Patient has prior history of Echocardiogram examinations, most recent 05/04/2022. Risk Factors:Hypertension, Diabetes, Dyslipidemia and Non-Smoker.  Sonographer:    Jeryl Columbia RDCS Referring Phys: [id number] TIFFANY Powers  IMPRESSIONS   1. Left ventricular ejection fraction, by estimation, is 60 to 65%. The left ventricle has normal function. The left ventricle has no regional wall motion abnormalities. There is severe left ventricular hypertrophy. Left ventricular diastolic parameters are consistent with Grade I diastolic dysfunction (impaired relaxation). 2. Right ventricular systolic function is normal. The right ventricular size is normal. Tricuspid regurgitation signal is inadequate for assessing PA pressure. 3. The mitral valve is normal in structure. No evidence of mitral valve regurgitation. 4. The aortic valve is normal in structure. Aortic valve regurgitation is not visualized. 5. The inferior vena cava is normal in size with greater than 50% respiratory variability, suggesting right atrial pressure of 3 mmHg.  FINDINGS Left Ventricle: Left ventricular ejection fraction, by estimation, is 60 to 65%. The left ventricle has normal function. The left ventricle has no regional wall motion abnormalities. The left ventricular internal cavity  size was normal in size. There is severe left ventricular hypertrophy. Left ventricular diastolic parameters are consistent with Grade I diastolic dysfunction (impaired relaxation).  Right Ventricle: The right ventricular size is normal. Right ventricular systolic function  is normal. Tricuspid regurgitation signal is inadequate for assessing PA pressure.  Left Atrium: Left atrial size was normal in size.  Right Atrium: Right atrial size was normal in size.  Pericardium: Trivial pericardial effusion is present.  Mitral Valve: The mitral valve is normal in structure. No evidence of mitral valve regurgitation.  Tricuspid Valve: Tricuspid valve regurgitation is not demonstrated.  Aortic Valve: The aortic valve is normal in structure. Aortic valve regurgitation is not visualized.  Pulmonic Valve: Pulmonic valve regurgitation is not visualized.  Aorta: The aortic root and ascending aorta are structurally normal, with no evidence of dilitation.  Venous: The inferior vena cava is normal in size with greater than 50% respiratory variability, suggesting right atrial pressure of 3 mmHg.  IAS/Shunts: No atrial level shunt detected by color flow Doppler.   LEFT VENTRICLE PLAX 2D LVIDd:         4.31 cm   Diastology LVIDs:         2.60 cm   LV e' medial:    4.68 cm/s LV PW:         1.78 cm   LV E/e' medial:  18.1 LV IVS:        1.84 cm   LV e' lateral:   4.57 cm/s LVOT diam:     2.10 cm   LV E/e' lateral: 18.6 LV SV:         68 LV SV Index:   32 LVOT Area:     3.46 cm  3D Volume EF: 3D EF:        49 % LV EDV:       189 ml LV ESV:       96 ml LV SV:        93 ml  RIGHT VENTRICLE RV Basal diam:  4.42 cm RV Mid diam:    3.21 cm RV S prime:     15.20 cm/s TAPSE (M-mode): 2.6 cm  LEFT ATRIUM             Index        RIGHT ATRIUM           Index LA diam:        5.30 cm 2.54 cm/m   RA Area:     15.90 cm LA Vol (A2C):   66.1 ml 31.71 ml/m  RA Volume:   46.30 ml  22.21 ml/m LA Vol  (A4C):   46.8 ml 22.45 ml/m LA Biplane Vol: 58.2 ml 27.92 ml/m AORTIC VALVE LVOT Vmax:   94.90 cm/s LVOT Vmean:  61.800 cm/s LVOT VTI:    0.195 m  AORTA Ao Root diam: 3.00 cm Ao Asc diam:  3.00 cm  MITRAL VALVE MV Area (PHT): 3.17 cm     SHUNTS MV Decel Time: 239 msec     Systemic VTI:  0.20 m MV E velocity: 84.90 cm/s   Systemic Diam: 2.10 cm MV A velocity: 102.00 cm/s MV E/A ratio:  0.83  [place] signed by Carolan Clines Signature Date/Time: 05/02/2023/1:12:13 PM    Final              Recent Labs: 10/06/2022: TSH 0.035 12/13/2022: Hemoglobin 14.0; Platelets 259 04/10/2023: ALT 7; BUN 15; Creatinine, Ser 1.43; Potassium 3.9; Sodium 141   Recent Lipid Panel    Component Value Date/Time   CHOL 111 04/10/2023 0842   TRIG 88 04/10/2023 0842   HDL 43 04/10/2023 0842   CHOLHDL 2.6 04/10/2023 0842   CHOLHDL 4.7 09/16/2019  0847   VLDL 23 01/24/2017 0916   LDLCALC 51 04/10/2023 0842   LDLCALC 136 (H) 09/16/2019 0847    Physical Exam:   VS:  BP (!) 162/94   Pulse 80   Ht 5\' 5"  (1.651 m)   Wt 227 lb (103 kg)   BMI 37.77 kg/m  , BMI Body mass index is 37.77 kg/m. GENERAL:  Well appearing, overweight HEENT: Pupils equal round and reactive, fundi not visualized, oral mucosa unremarkable NECK:  No jugular venous distention, waveform within normal limits, carotid upstroke brisk and symmetric, no bruits, no thyromegaly LYMPHATICS:  No cervical adenopathy LUNGS:  Clear to auscultation bilaterally HEART:  RRR.  PMI not displaced or sustained,S1 and S2 within normal limits, no S3, no S4, no clicks, no rubs, no murmurs ABD:  Flat, positive bowel sounds normal in frequency in pitch, no bruits, no rebound, no guarding, no midline pulsatile mass, no hepatomegaly, no splenomegaly EXT:  2 plus pulses throughout, no edema, no cyanosis no clubbing SKIN:  No rashes no nodules NEURO:  Cranial nerves II through XII grossly intact, motor grossly intact  throughout PSYCH:  Cognitively intact, oriented to person place and time   ASSESSMENT/PLAN:    Chronic systolic and diastolic heart failure-04/2022 LVEF 45-50%.  Repeat echo 04/2023 normalized LVEF 60-65%. LVEF has normalized which is reassuring. Euvolemic and well compensated on exam. GDMT Lasix, Hydralazine, Imdur, Labetolol, Entresto. Low sodium diet, fluid restriction <2L, and daily weights encouraged. Educated to contact our office for weight gain of 2 lbs overnight or 5 lbs in one week.   OSA  -sleep study with mild OSA.  Following with pulmonology.  Auto CPAP ordered earlier this month but not yet received.  Resistant hypertension / Severe LVH - BP not at goal <130/80.  Has been out of multiple medications and has refills pending at the pharmacy.  However, even when she had all of her medications her BP at home was still 130s-140 systolic.  Continue amlodipine 10 mg daily, Lasix 40 mg daily, hydralazine 100 mg TID, clonidine 0.3 mg patch, Imdur 60 mg daily, labetalol 300 mg BID, Entresto 97-103 mg BID.   Check in via MyChart in 1 week.  If BP not at goal plan to increase dose of Imdur.  Given persistently elevated BP and resistant hypertension despite multiple antihypertensive agents will initiate prior authorization for renal denervation. She is very interested in procedure. Secondary screenings, as below.   Hyperlipidemia-04/10/2023 total cholesterol 111, triglyceride 88, HDL 43, LDL 51.  Continue rosuvastatin.  Screening for Secondary Hypertension:     09/25/2022    9:13 AM 10/26/2022    6:34 AM  Causes  Drugs/Herbals Screened      - Comments One tea daily, trying to limit sodium, rare EtOH, no tobaccos use.  No NSAIDS.   Renovascular HTN Screened Screened     - Comments Check renal artery Dopplers no RAS  Sleep Apnea Screened      - Comments sleep study pending   Thyroid Disease Screened   Hyperaldosteronism Screened   Pheochromocytoma N/A   Cushing's Syndrome N/A    Hyperparathyroidism Screened   Coarctation of the Aorta Screened      - Comments BP symmetric   Compliance Screened     Relevant Labs/Studies:    Latest Ref Rng & Units 04/10/2023    8:42 AM 01/03/2023    8:45 AM 07/20/2022   11:44 AM  Basic Labs  Sodium 134 - 144 mmol/L 141  139  137  Potassium 3.5 - 5.2 mmol/L 3.9  4.5  3.6   Creatinine 0.57 - 1.00 mg/dL 4.09  8.11  9.14        Latest Ref Rng & Units 10/06/2022    4:54 PM 05/17/2022   10:23 AM  Thyroid   TSH 0.450 - 4.500 uIU/mL 0.035  2.640        Latest Ref Rng & Units 05/04/2022    8:38 AM  Renin/Aldosterone   Aldosterone 0.0 - 30.0 ng/dL 78.2              95/05/2129   10:58 AM  Renovascular   Renal Artery [place] Completed Yes     Disposition:    FU with MD/PharmD in 6 weeks   Medication Adjustments/Labs and Tests Ordered: Current medicines are reviewed at length with the patient today.  Concerns regarding medicines are outlined above.  Orders Placed This Encounter  Procedures   EKG 12-Lead   Meds ordered this encounter  Medications   furosemide (LASIX) 40 MG tablet    Sig: Take 1 tablet (40 mg total) by mouth daily.    Dispense:  90 tablet    Refill:  3    Order Specific Question:   Supervising Provider    Answer:   Jodelle Red [[id number]]   isosorbide mononitrate (IMDUR) 60 MG 24 hr tablet    Sig: Take 1 tablet (60 mg total) by mouth daily.    Dispense:  90 tablet    Refill:  3    Order Specific Question:   Supervising Provider    Answer:   Jodelle Red [[id number]]   labetalol (NORMODYNE) 300 MG tablet    Sig: Take 1 tablet (300 mg total) by mouth 2 (two) times daily. For BP    Dispense:  180 tablet    Refill:  3    Order Specific Question:   Supervising Provider    Answer:   Jodelle Red [[id number]]   amLODipine (NORVASC) 10 MG tablet    Sig: Take 1 tablet (10 mg total) by mouth daily.    Dispense:  90 tablet    Refill:  3    Order Specific Question:   Supervising  Provider    Answer:   Jodelle Red [[id number]]    Signed, Alver Sorrow, NP  05/10/2023 1:10 PM    [facility]

## 2023-05-10 NOTE — Patient Instructions (Signed)
Medication Instructions:  Your physician recommends that you continue on your current medications as directed. Please refer to the Current Medication list given to you today.  We have sent you in refills today!    Testing/Procedures: We will send your information to the precert team to see if you qualify for the renal denervation.    Follow-Up: 6 weeks in HTN CLINIC    Special Instructions:  We will send you a message in one week to check on blood pressures. If you will please bring your blood pressure cuff to your next visit.

## 2023-05-17 ENCOUNTER — Encounter (HOSPITAL_BASED_OUTPATIENT_CLINIC_OR_DEPARTMENT_OTHER): Payer: Self-pay

## 2023-05-30 ENCOUNTER — Telehealth: Payer: Self-pay | Admitting: *Deleted

## 2023-05-30 NOTE — Telephone Encounter (Signed)
Left a message for the patient to call back to discuss the Renal Denervation Procedure.

## 2023-05-31 NOTE — Telephone Encounter (Signed)
Left a message for the patient to call back.  

## 2023-06-05 ENCOUNTER — Other Ambulatory Visit: Payer: Self-pay | Admitting: "Endocrinology

## 2023-06-05 DIAGNOSIS — E890 Postprocedural hypothyroidism: Secondary | ICD-10-CM

## 2023-06-06 ENCOUNTER — Ambulatory Visit: Payer: Managed Care, Other (non HMO) | Admitting: "Endocrinology

## 2023-06-06 NOTE — Telephone Encounter (Signed)
Left a message for the patient to call back. MyChart message has been sent as well.  ?

## 2023-06-11 NOTE — Telephone Encounter (Signed)
Hi! Just FYI for your visit 7/10 with her they tried to reach her about RDN.   Alver Sorrow, NP

## 2023-06-18 ENCOUNTER — Other Ambulatory Visit: Payer: Self-pay

## 2023-06-18 DIAGNOSIS — D500 Iron deficiency anemia secondary to blood loss (chronic): Secondary | ICD-10-CM

## 2023-06-18 DIAGNOSIS — E538 Deficiency of other specified B group vitamins: Secondary | ICD-10-CM

## 2023-06-20 ENCOUNTER — Inpatient Hospital Stay: Payer: Managed Care, Other (non HMO) | Attending: Physician Assistant | Admitting: Oncology

## 2023-06-20 DIAGNOSIS — D631 Anemia in chronic kidney disease: Secondary | ICD-10-CM | POA: Diagnosis present

## 2023-06-20 DIAGNOSIS — N1832 Chronic kidney disease, stage 3b: Secondary | ICD-10-CM | POA: Insufficient documentation

## 2023-06-20 DIAGNOSIS — R779 Abnormality of plasma protein, unspecified: Secondary | ICD-10-CM | POA: Insufficient documentation

## 2023-06-20 DIAGNOSIS — D509 Iron deficiency anemia, unspecified: Secondary | ICD-10-CM | POA: Insufficient documentation

## 2023-06-20 DIAGNOSIS — E538 Deficiency of other specified B group vitamins: Secondary | ICD-10-CM | POA: Diagnosis not present

## 2023-06-20 DIAGNOSIS — E890 Postprocedural hypothyroidism: Secondary | ICD-10-CM

## 2023-06-20 DIAGNOSIS — D500 Iron deficiency anemia secondary to blood loss (chronic): Secondary | ICD-10-CM

## 2023-06-20 LAB — CBC WITH DIFFERENTIAL/PLATELET
Abs Immature Granulocytes: 0.01 10*3/uL (ref 0.00–0.07)
Basophils Absolute: 0 10*3/uL (ref 0.0–0.1)
Basophils Relative: 0 %
Eosinophils Absolute: 0.1 10*3/uL (ref 0.0–0.5)
Eosinophils Relative: 2 %
HCT: 42 % (ref 36.0–46.0)
Hemoglobin: 14.2 g/dL (ref 12.0–15.0)
Immature Granulocytes: 0 %
Lymphocytes Relative: 24 %
Lymphs Abs: 1.6 10*3/uL (ref 0.7–4.0)
MCH: 33.3 pg (ref 26.0–34.0)
MCHC: 33.8 g/dL (ref 30.0–36.0)
MCV: 98.6 fL (ref 80.0–100.0)
Monocytes Absolute: 0.6 10*3/uL (ref 0.1–1.0)
Monocytes Relative: 9 %
Neutro Abs: 4.5 10*3/uL (ref 1.7–7.7)
Neutrophils Relative %: 65 %
Platelets: 190 10*3/uL (ref 150–400)
RBC: 4.26 MIL/uL (ref 3.87–5.11)
RDW: 12.4 % (ref 11.5–15.5)
WBC: 6.9 10*3/uL (ref 4.0–10.5)
nRBC: 0 % (ref 0.0–0.2)

## 2023-06-20 LAB — VITAMIN B12: Vitamin B-12: 324 pg/mL (ref 180–914)

## 2023-06-20 LAB — COMPREHENSIVE METABOLIC PANEL
ALT: 14 U/L (ref 0–44)
AST: 21 U/L (ref 15–41)
Albumin: 3.9 g/dL (ref 3.5–5.0)
Alkaline Phosphatase: 73 U/L (ref 38–126)
Anion gap: 8 (ref 5–15)
BUN: 19 mg/dL (ref 6–20)
CO2: 25 mmol/L (ref 22–32)
Calcium: 9.2 mg/dL (ref 8.9–10.3)
Chloride: 102 mmol/L (ref 98–111)
Creatinine, Ser: 1.71 mg/dL — ABNORMAL HIGH (ref 0.44–1.00)
GFR, Estimated: 35 mL/min — ABNORMAL LOW (ref >60)
Glucose, Bld: 111 mg/dL — ABNORMAL HIGH (ref 70–99)
Potassium: 3.8 mmol/L (ref 3.5–5.1)
Sodium: 135 mmol/L (ref 135–145)
Total Bilirubin: 0.7 mg/dL (ref 0.3–1.2)
Total Protein: 7.6 g/dL (ref 6.5–8.1)

## 2023-06-20 LAB — IRON AND TIBC
Iron: 70 ug/dL (ref 28–170)
Saturation Ratios: 21 % (ref 10.4–31.8)
TIBC: 337 ug/dL (ref 250–450)
UIBC: 267 ug/dL

## 2023-06-20 LAB — TSH: TSH: 1.33 u[IU]/mL (ref 0.350–4.500)

## 2023-06-20 LAB — FERRITIN: Ferritin: 35 ng/mL (ref 11–307)

## 2023-06-20 LAB — FOLATE: Folate: 7.6 ng/mL (ref >5.9)

## 2023-06-20 LAB — T4, FREE: Free T4: 1.09 ng/dL (ref 0.61–1.12)

## 2023-06-24 LAB — METHYLMALONIC ACID, SERUM: Methylmalonic Acid, Quantitative: 170 nmol/L (ref 0–378)

## 2023-06-25 NOTE — Progress Notes (Signed)
VIRTUAL VISIT via TELEPHONE NOTE [facility]   I connected with Tracy Joseph  on 06/26/23 at  10:20 AM by telephone and verified that I am speaking with the correct person using two identifiers.  Location: Patient: Home Provider: [facility]   I discussed the limitations, risks, security and privacy concerns of performing an evaluation and management service by telephone and the availability of in person appointments. I also discussed with the patient that there may be a patient responsible charge related to this service. The patient expressed understanding and agreed to proceed.  REASON FOR VISIT:  Follow-up for iron deficiency anemia   CURRENT THERAPY: IV iron  INTERVAL HISTORY:  Tracy Joseph is contacted today for follow-up of iron deficiency anemia.  She was last evaluated by Rojelio Brenner PA-C on 12/20/2022.   At today's visit, she reports feeling well.   No recent hospitalizations, surgeries, or changes in baseline health status.  She currently reports her energy is good.  She has not had a menstrual period in 3 to 4 months, reports occasional mild spotting.  She denies any rectal bleeding or melena.  No abnormal fatigue or pica.  No headaches, dizziness, dyspnea, chest pain, lightheadedness, or syncope.  She continues to take daily iron, multivitamin, and B12, as well as weekly vitamin D.  She has 100% energy and 100% appetite. She endorses that she is maintaining a stable weight.  REVIEW OF SYSTEMS: No complaints at today's visit  Review of Systems  Constitutional:  Negative for chills, diaphoresis, fever, malaise/fatigue and weight loss.  Respiratory:  Negative for cough and shortness of breath.   Cardiovascular:  Negative for chest pain and palpitations.  Gastrointestinal:  Negative for abdominal pain, blood in stool, melena, nausea and vomiting.  Neurological:  Negative for dizziness and headaches.     PHYSICAL EXAM: (per  limitations of virtual telephone visit)  The patient is alert and oriented x 3, exhibiting adequate mentation, good mood, and ability to speak in full sentences and execute sound judgement.  ASSESSMENT & PLAN:  1.  Iron deficiency anemia - Seen at the request of Dr. Lodema Hong for severe microcytic anemia from iron deficiency and CKD stage IIIa/b - EGD/colonoscopy (07/25/2022): Erythematous mucosa in stomach, polyps and diverticulosis in colon.  No obvious source of bleeding. - Labs from 05/04/2022 showed severely low ferritin 3 with Hgb 8.3 - Additional hematology work-up (05/26/2022) was unremarkable: CMP with baseline CKD stage IIIa/b (creatinine 1.26/GFR 51) Low reticulocytes 1.6%.  Normal LDH. Normal B12, MMA, folate, copper  Elevated free light chains discussed below  - She previously had severe menorrhagia, but has not had a period in 3 to 4 months.  She is believed to be perimenopausal. - Denies any bright blood per rectum or melena. - She has been taking iron tablet since May 2023 - She received IV Venofer x3 in June/July 2023 - Most recent labs (06/20/2023): Normal CBC with Hgb 14.2, ferritin 35, iron saturation 21%.  Baseline CKD stage IIIb. - PLAN: Anemia has resolved and she is asymptomatic.  Iron levels are improving on oral iron alone. - Continue daily ferrous sulfate.  - Labs and office visit in 1 year   2.  Folic acid & vitamin B12 deficiency - She is taking daily vitamin B12 and multivitamin  - Most recent labs (06/20/2023): B12 324/normal MMA.  Normal folate 7.6. - PLAN: Continue daily vitamin B12 and restart her daily multivitamin.  We will check labs  at follow-up visit.   3.  Elevated free light chains - Workup of anemia (05/26/2022) revealed elevated kappa light chains 43.2, normal lambda, elevated ratio 2.12.  Normal SPEP. - Immunofixation (12/13/2022) unremarkable. - PLAN: Suspect elevated free light chains related to CKD.  No further workup.   4.  Social/family history: -  Works at [employer] at [employer].  Non-smoker. - No family history of anemia or malignancy.  PLAN SUMMARY: >> Labs in 1 year = CBC/D, CMP, ferritin, iron/TIBC, B12, folate, MMA >> OFFICE visit in 1 year (1 week after labs)     I discussed the assessment and treatment plan with the patient. The patient was provided an opportunity to ask questions and all were answered. The patient agreed with the plan and demonstrated an understanding of the instructions.   The patient was advised to call back or seek an in-person evaluation if the symptoms worsen or if the condition fails to improve as anticipated.  I provided 14 minutes of non-face-to-face time during this encounter.  Carnella Guadalajara, PA-C 06/26/2023 10:32 AM

## 2023-06-26 ENCOUNTER — Inpatient Hospital Stay (HOSPITAL_BASED_OUTPATIENT_CLINIC_OR_DEPARTMENT_OTHER): Payer: Managed Care, Other (non HMO) | Admitting: Physician Assistant

## 2023-06-26 DIAGNOSIS — D500 Iron deficiency anemia secondary to blood loss (chronic): Secondary | ICD-10-CM | POA: Diagnosis not present

## 2023-06-26 DIAGNOSIS — E538 Deficiency of other specified B group vitamins: Secondary | ICD-10-CM | POA: Diagnosis not present

## 2023-06-27 ENCOUNTER — Ambulatory Visit (HOSPITAL_BASED_OUTPATIENT_CLINIC_OR_DEPARTMENT_OTHER): Payer: Managed Care, Other (non HMO)

## 2023-06-27 NOTE — Progress Notes (Deleted)
06/27/2023 Tracy Joseph Tracy Joseph 1971-08-10 [id number]   HPI:  Tracy Joseph is a 52 y.o. female patient of Dr Duke Salvia, with a PMH below who presents today for advanced hypertension clinic follow up.  She was seen  by Dr. Izora Ribas just 2 years ago as a consult for malignant hypertension, and at that first visit had a pressure of 205/135, dropping to 200/110 in the office.  Because of worsening kidney function, he started her on labetalol.  At follow up with Laural Golden pressure was still at 200/130.  She was then lost to follow up for 2 years, when she was referred from her PCP to the [facility].  Pressure at that time was still elevated, 179/96.  She was agreeable to join our RPM research and randomized to group 1.  By her final research appointment, pressure was 163/99 and she was at maximum doses of 4 medications as well as a fifth med (labetalol 300 mg bid).  Renal denervation was reviewed with her as an option and she was interested in procedure.   Today she returns for follow up.    Past Medical History: hyperlipidemia 3/23 LDL 140, currently not on medication  CKD Stage 3b  CHF HFmrEF at 45-50%, on labetalol, Entresto; grade 2 diastolic dysfunction  DM2 3/23 A1c 6.1; was as high as 7.5 in 2020  OSA Study positive, has upcoming appointment  hypothyroid 10/23 TSH 0.035, T4 2.15; levothyroxine decreased to 150 mcg    Blood Pressure Goal:  130/80  Current Medications:   amlodipine 10 mg qd, clonidine 0.2 mg patch qFriday, hydralazine 100 mg tid, labetalol 300 mg bid, Entresto 49/51 bid  Family Hx:  strong family history of hypertension; all of her cousins are on 5+ meds, mother and aunts good with 1-2 meds; maternal GM died stroke; 2 aunts with CABG, father family more cancers; has 1 daughter, no diagnosed hypertension  Social Hx: no tobacco, only occasional alcohol; no soda in 2 weeks, does drink coffee when cold outside [employer])      Diet:  eats out except  1-2 times per week,  at home experimenting with [name] and other seasonings    Exercise: [employer] three times weekly (45-60 min/visit); PREP starts next week  Home BP readings:  4 home readings with her today, average 169/89 (Last month 7 readings average 159/95)    Intolerances:  Ace inhibitors - cough Amlodipine - LE edema Benicar - cough, dry, pruritis  Labs: 8/23:  Na 137, K 3.6, Glu 84, BUN 16, SCr 1.26, GFR 51   Wt Readings from Last 3 Encounters:  05/10/23 227 lb (103 kg)  04/25/23 226 lb 3.2 oz (102.6 kg)  03/28/23 226 lb 8 oz (102.7 kg)   BP Readings from Last 3 Encounters:  05/10/23 (!) 162/94  04/25/23 (!) 144/90  03/28/23 (!) 175/98   Pulse Readings from Last 3 Encounters:  05/10/23 80  04/25/23 70  03/28/23 75    Current Outpatient Medications  Medication Sig Dispense Refill   amLODipine (NORVASC) 10 MG tablet Take 1 tablet (10 mg total) by mouth daily. 90 tablet 3   cloNIDine (CATAPRES - DOSED IN MG/24 HR) 0.3 mg/24hr patch Place 1 patch (0.3 mg total) onto the skin once a week. 4 patch 12   ferrous sulfate 325 (65 FE) MG EC tablet Take 1 tablet (325 mg total) by mouth daily with breakfast. 90 tablet 3   furosemide (LASIX) 40 MG tablet Take 1 tablet (  40 mg total) by mouth daily. 90 tablet 3   hydrALAZINE (APRESOLINE) 100 MG tablet TAKE 1 TABLET BY MOUTH TWICE A DAY 180 tablet 2   isosorbide mononitrate (IMDUR) 60 MG 24 hr tablet Take 1 tablet (60 mg total) by mouth daily. 90 tablet 3   labetalol (NORMODYNE) 300 MG tablet Take 1 tablet (300 mg total) by mouth 2 (two) times daily. For BP 180 tablet 3   levothyroxine (SYNTHROID) 175 MCG tablet TAKE ONE AND AN HALF TABLETS ONCE DAILY EVERY MONDAY, WEDNESDAY AND FRIDAY, THEN TAKE ONE TABLET ONCE DAILY, EVERY TUESDAY, THURSDAY, SATURDAY AND SUNDAY 108 tablet 3   norethindrone (AYGESTIN) 5 MG tablet Take 1 tablet (5 mg total) by mouth daily. 90 tablet 4   pantoprazole (PROTONIX) 40 MG tablet TAKE 1 TABLET (40 MG  TOTAL) BY MOUTH TWICE A DAY BEFORE MEALS 180 tablet 1   rosuvastatin (CRESTOR) 10 MG tablet Take 1 tablet (10 mg total) by mouth daily. 90 tablet 1   sacubitril-valsartan (ENTRESTO) 97-103 MG Take 1 tablet by mouth 2 (two) times daily. 180 tablet 3   vitamin B-12 (CYANOCOBALAMIN) 1000 MCG tablet Take 1 tablet (1,000 mcg total) by mouth daily. 30 tablet 3   Vitamin D, Ergocalciferol, (DRISDOL) 1.25 MG (50000 UNIT) CAPS capsule TAKE 1 CAPSULE (50,000 UNITS TOTAL) BY MOUTH ONCE A WEEK. ONE CAPSULE ONCE WEEKLY 12 capsule 2   No current facility-administered medications for this visit.    Allergies  Allergen Reactions   Ace Inhibitors Cough   Dust Mite Extract    Latex Itching   Pollen Extract     Past Medical History:  Diagnosis Date   ACE-inhibitor cough    Chronic combined systolic and diastolic heart failure (HCC) 05/04/2022   New dx in 04/2022 during hospitalization. Severe LVH, grade2 diastolic dysfunction, EF 45 to 50 %   Chronic GERD    Essential hypertension    Goiter    Initially hyperthyroid treated with RAI, now hypothyroid - follows with Dr. Everardo All   Helicobacter pylori ab+    Hypertension    [employer]reesia 10/12/2020   Hypothyroidism    Iron deficiency anemia    Mixed hyperlipidemia    Obstructive sleep apnea    Suspected    Prediabetes    Seasonal allergies    Tubular adenoma of colon     There were no vitals taken for this visit.  No BP recorded.  {Refresh Note OR Click here to enter BP  :1}*** No problem-specific Assessment & Plan notes found for this encounter.   Phillips Hay PharmD CPP [facility] [address] [place], [place] [address] [phone]

## 2023-07-04 ENCOUNTER — Encounter: Payer: Self-pay | Admitting: Family Medicine

## 2023-07-04 ENCOUNTER — Ambulatory Visit (INDEPENDENT_AMBULATORY_CARE_PROVIDER_SITE_OTHER): Payer: Managed Care, Other (non HMO) | Admitting: Family Medicine

## 2023-07-04 VITALS — BP 190/108 | HR 75 | Ht 65.0 in | Wt 228.1 lb

## 2023-07-04 DIAGNOSIS — Z6836 Body mass index (BMI) 36.0-36.9, adult: Secondary | ICD-10-CM

## 2023-07-04 DIAGNOSIS — E782 Mixed hyperlipidemia: Secondary | ICD-10-CM

## 2023-07-04 DIAGNOSIS — I1A0 Resistant hypertension: Secondary | ICD-10-CM

## 2023-07-04 DIAGNOSIS — G4733 Obstructive sleep apnea (adult) (pediatric): Secondary | ICD-10-CM

## 2023-07-04 DIAGNOSIS — J3089 Other allergic rhinitis: Secondary | ICD-10-CM

## 2023-07-04 DIAGNOSIS — E119 Type 2 diabetes mellitus without complications: Secondary | ICD-10-CM

## 2023-07-04 DIAGNOSIS — E890 Postprocedural hypothyroidism: Secondary | ICD-10-CM

## 2023-07-04 DIAGNOSIS — I10 Essential (primary) hypertension: Secondary | ICD-10-CM | POA: Diagnosis not present

## 2023-07-04 DIAGNOSIS — R7303 Prediabetes: Secondary | ICD-10-CM

## 2023-07-04 DIAGNOSIS — I5042 Chronic combined systolic (congestive) and diastolic (congestive) heart failure: Secondary | ICD-10-CM

## 2023-07-04 LAB — POCT GLYCOSYLATED HEMOGLOBIN (HGB A1C): HbA1c, POC (controlled diabetic range): 6.1 % (ref 0.0–7.0)

## 2023-07-04 MED ORDER — PREDNISONE 5 MG PO TABS: 5.0000 mg | ORAL_TABLET | Freq: Two times a day (BID) | ORAL | 0 refills | Status: AC

## 2023-07-04 NOTE — Progress Notes (Signed)
Tracy Joseph     MRN: [id number]      DOB: 09-Oct-1971  Chief Complaint  Patient presents with   Follow-up    Follow up    HPI Tracy Joseph is here for follow up and re-evaluation of chronic medical conditions, medication management and review of any available recent lab and radiology data.  Preventive health is updated, specifically  Cancer screening and Immunization.   Questions or concerns regarding consultations or procedures which the PT has had in the interim are  addressed. The PT denies any adverse reactions to current medications since the last visit.  There are no new concerns.  There are no specific complaints  Entrestp 45/month, cost of CPAP is $400 up front and $90/month  ROS Denies recent fever or chills. 1 week h/o increased nasal congestion with clear drainage and mild sinus pressure, denies fever, chills , sore throat or ear pain Denies chest congestion, productive cough or wheezing. Denies chest pains, palpitations and leg swelling Denies abdominal pain, nausea, vomiting,diarrhea or constipation.   Denies dysuria, frequency, hesitancy or incontinence. Denies joint pain, swelling and limitation in mobility. Denies headaches, seizures, numbness, or tingling. Denies depression, anxiety or insomnia. Denies skin break down or rash.   PE  BP (!) 190/108   Pulse 75   Ht 5\' 5"  (1.651 m)   Wt 228 lb 1.9 oz (103.5 kg)   SpO2 93%   BMI 37.96 kg/m   Patient alert and oriented and in no cardiopulmonary distress.  HEENT: No facial asymmetry, EOMI,     Neck supple .No sinus tenderness  Chest: Clear to auscultation bilaterally.  CVS: S1, S2 no murmurs, no S3.Regular rate.  ABD: Soft non tender.   Ext: No edema  MS: Adequate ROM spine, shoulders, hips and knees.  Skin: Intact, no ulcerations or rash noted.  Psych: Good eye contact, normal affect. Memory intact not anxious or depressed appearing.  CNS: CN 2-12 intact, power,  normal throughout.no focal  deficits noted.   Assessment & Plan  Class 2 severe obesity due to excess calories with serious comorbidity and body mass index (BMI) of 36.0 to 36.9 in adult [facility])  Patient re-educated about  the importance of commitment to a  minimum of 150 minutes of exercise per week as able.  The importance of healthy food choices with portion control discussed, as well as eating regularly and within a 12 hour window most days. The need to choose "clean , green" food 50 to 75% of the time is discussed, as well as to make water the primary drink and set a goal of 64 ounces water daily.       07/04/2023    8:08 AM 05/10/2023    8:35 AM 04/25/2023    8:34 AM  Weight /BMI  Weight 228 lb 1.9 oz 227 lb 226 lb 3.2 oz  Height 5\' 5"  (1.651 m) 5\' 5"  (1.651 m) 5\' 5"  (1.651 m)  BMI 37.96 kg/m2 37.77 kg/m2 37.64 kg/m2     Unchanged Now considering bariatric surgery, which I believe is indicated  Resistant hypertension DASH diet and commitment to daily physical activity for a minimum of 30 minutes discussed and encouraged, as a part of hypertension management. The importance of attaining a healthy weight is also discussed.     07/04/2023    8:27 AM 07/04/2023    8:09 AM 07/04/2023    8:08 AM 05/10/2023    9:00 AM 05/10/2023    8:35 AM 04/25/2023  9:19 AM 04/25/2023    8:34 AM  BP/Weight  Systolic BP 190 149 172 162 173 144 150  Diastolic BP 108 91 114 94 117 90 100  Wt. (Lbs)   228.12  227  226.2  BMI   37.96 kg/m2  37.77 kg/m2  37.64 kg/m2     Uncontrolled challenged ti fill all prescriptions esp Lucretia Roers will reach out to cardiology as well as try to do pt assistance from this office  Sleep apnea Getting $$ together for starting definitive treatment, importance of same is stressed  Mixed hyperlipidemia Hyperlipidemia:Low fat diet discussed and encouraged.   Lipid Panel  Lab Results  Component Value Date   CHOL 111 04/10/2023   HDL 43 04/10/2023   LDLCALC 51 04/10/2023   TRIG 88 04/10/2023    CHOLHDL 2.6 04/10/2023     Controlled, no change in medication   Hypothyroidism following radioiodine therapy Controlled and managed by Endo  Prediabetes Patient educated about the importance of limiting  Carbohydrate intake , the need to commit to daily physical activity for a minimum of 30 minutes , and to commit weight loss. The fact that changes in all these areas will reduce or eliminate all together the development of diabetes is stressed.      Latest Ref Rng & Units 07/04/2023    8:28 AM 06/20/2023    8:41 AM 04/10/2023    8:42 AM 01/03/2023    8:45 AM 01/03/2023    8:44 AM  Diabetic Labs  HbA1c 0.0 - 7.0 % 6.1    6.0    Chol 100 - 199 mg/dL   161   096   HDL >04 mg/dL   43   36   Calc LDL 0 - 99 mg/dL   51   540   Triglycerides 0 - 149 mg/dL   88   92   Creatinine 0.44 - 1.00 mg/dL  9.81  1.91  4.78        07/04/2023    8:27 AM 07/04/2023    8:09 AM 07/04/2023    8:08 AM 05/10/2023    9:00 AM 05/10/2023    8:35 AM 04/25/2023    9:19 AM 04/25/2023    8:34 AM  BP/Weight  Systolic BP 190 149 172 162 173 144 150  Diastolic BP 108 91 114 94 117 90 100  Wt. (Lbs)   228.12  227  226.2  BMI   37.96 kg/m2  37.77 kg/m2  37.64 kg/m2      Latest Ref Rng & Units 03/29/2021   12:00 AM 12/01/2020    8:00 AM  Foot/eye exam completion dates  Eye Exam No Retinopathy No Retinopathy       Foot Form Completion   Done     This result is from an external source.      Chronic combined systolic and diastolic heart failure (HCC) No s/s of decompensation but challenged with affording treatment in form of entresto as well as CPAP  Environmental and seasonal allergies Uncontrolled , short course of prednisone x 5 days

## 2023-07-04 NOTE — Patient Instructions (Addendum)
F/U in  18 weeks, call if you need me sooner  I will message Cardiology, regarding your concerns  Please follow diet rich in veges and fruit, asnd reduce sweets, cake, pudding etc  Blood sugar has increased slightly  It is important that you exercise regularly at least 30 minutes 5 times a week. If you develop chest pain, have severe difficulty breathing, or feel very tired, stop exercising immediately and seek medical attention   Nurse pls assist with getting pt assistance forms / pharmacy help for Entresto, let me know if anything can be done  5 day course of prednisone is prescribed for uncontrolled allergy symptoms  Fasting lipid, cmp and eGFr 3 to 5 days before next appt  Thanks for choosing [facility], we consider it a privelige to serve you.

## 2023-07-09 ENCOUNTER — Telehealth: Payer: Self-pay | Admitting: Family Medicine

## 2023-07-09 NOTE — Telephone Encounter (Signed)
I spoke with pt, she uis asking about referral to bariatric clinic. I explained that she needs to go on line and start the process and that we do not directly refer. I advised her that if she has problems getting on line to send a message Regarding entresto, she told me you were going to send in for pt assistance for her , send in the paperwork, just sending this message so you are aware of the expectation/ patient's understanding of the process

## 2023-07-09 NOTE — Telephone Encounter (Signed)
Patient calling says she was supposed to get a referral for weight loss but has not heard anything. Please advise, thank you.

## 2023-07-10 ENCOUNTER — Encounter: Payer: Self-pay | Admitting: Family Medicine

## 2023-07-10 NOTE — Assessment & Plan Note (Signed)
Patient educated about the importance of limiting  Carbohydrate intake , the need to commit to daily physical activity for a minimum of 30 minutes , and to commit weight loss. The fact that changes in all these areas will reduce or eliminate all together the development of diabetes is stressed.      Latest Ref Rng & Units 07/04/2023    8:28 AM 06/20/2023    8:41 AM 04/10/2023    8:42 AM 01/03/2023    8:45 AM 01/03/2023    8:44 AM  Diabetic Labs  HbA1c 0.0 - 7.0 % 6.1    6.0    Chol 100 - 199 mg/dL   191   478   HDL >29 mg/dL   43   36   Calc LDL 0 - 99 mg/dL   51   562   Triglycerides 0 - 149 mg/dL   88   92   Creatinine 0.44 - 1.00 mg/dL  1.30  8.65  7.84        07/04/2023    8:27 AM 07/04/2023    8:09 AM 07/04/2023    8:08 AM 05/10/2023    9:00 AM 05/10/2023    8:35 AM 04/25/2023    9:19 AM 04/25/2023    8:34 AM  BP/Weight  Systolic BP 190 149 172 162 173 144 150  Diastolic BP 108 91 114 94 117 90 100  Wt. (Lbs)   228.12  227  226.2  BMI   37.96 kg/m2  37.77 kg/m2  37.64 kg/m2      Latest Ref Rng & Units 03/29/2021   12:00 AM 12/01/2020    8:00 AM  Foot/eye exam completion dates  Eye Exam No Retinopathy No Retinopathy       Foot Form Completion   Done     This result is from an external source.

## 2023-07-10 NOTE — Assessment & Plan Note (Signed)
No s/s of decompensation but challenged with affording treatment in form of entresto as well as CPAP

## 2023-07-10 NOTE — Assessment & Plan Note (Signed)
Getting $$ together for starting definitive treatment, importance of same is stressed

## 2023-07-10 NOTE — Assessment & Plan Note (Signed)
Hyperlipidemia:Low fat diet discussed and encouraged.   Lipid Panel  Lab Results  Component Value Date   CHOL 111 04/10/2023   HDL 43 04/10/2023   LDLCALC 51 04/10/2023   TRIG 88 04/10/2023   CHOLHDL 2.6 04/10/2023     Controlled, no change in medication

## 2023-07-10 NOTE — Assessment & Plan Note (Signed)
DASH diet and commitment to daily physical activity for a minimum of 30 minutes discussed and encouraged, as a part of hypertension management. The importance of attaining a healthy weight is also discussed.     07/04/2023    8:27 AM 07/04/2023    8:09 AM 07/04/2023    8:08 AM 05/10/2023    9:00 AM 05/10/2023    8:35 AM 04/25/2023    9:19 AM 04/25/2023    8:34 AM  BP/Weight  Systolic BP 190 149 172 162 173 144 150  Diastolic BP 108 91 114 94 117 90 100  Wt. (Lbs)   228.12  227  226.2  BMI   37.96 kg/m2  37.77 kg/m2  37.64 kg/m2     Uncontrolled challenged ti fill all prescriptions esp Lucretia Roers will reach out to cardiology as well as try to do pt assistance from this office

## 2023-07-10 NOTE — Assessment & Plan Note (Signed)
Controlled and managed by Endo 

## 2023-07-10 NOTE — Assessment & Plan Note (Signed)
  Patient re-educated about  the importance of commitment to a  minimum of 150 minutes of exercise per week as able.  The importance of healthy food choices with portion control discussed, as well as eating regularly and within a 12 hour window most days. The need to choose "clean , green" food 50 to 75% of the time is discussed, as well as to make water the primary drink and set a goal of 64 ounces water daily.       07/04/2023    8:08 AM 05/10/2023    8:35 AM 04/25/2023    8:34 AM  Weight /BMI  Weight 228 lb 1.9 oz 227 lb 226 lb 3.2 oz  Height 5\' 5"  (1.651 m) 5\' 5"  (1.651 m) 5\' 5"  (1.651 m)  BMI 37.96 kg/m2 37.77 kg/m2 37.64 kg/m2     Unchanged Now considering bariatric surgery, which I believe is indicated

## 2023-07-10 NOTE — Assessment & Plan Note (Signed)
Uncontrolled , short course of prednisone x 5 days

## 2023-07-17 ENCOUNTER — Encounter: Payer: Self-pay | Admitting: Family Medicine

## 2023-07-18 ENCOUNTER — Ambulatory Visit (INDEPENDENT_AMBULATORY_CARE_PROVIDER_SITE_OTHER): Payer: Managed Care, Other (non HMO) | Admitting: "Endocrinology

## 2023-07-18 ENCOUNTER — Encounter: Payer: Self-pay | Admitting: "Endocrinology

## 2023-07-18 VITALS — BP 118/82 | HR 76 | Ht 65.0 in | Wt 235.4 lb

## 2023-07-18 DIAGNOSIS — R7303 Prediabetes: Secondary | ICD-10-CM | POA: Diagnosis not present

## 2023-07-18 DIAGNOSIS — E782 Mixed hyperlipidemia: Secondary | ICD-10-CM | POA: Diagnosis not present

## 2023-07-18 DIAGNOSIS — E890 Postprocedural hypothyroidism: Secondary | ICD-10-CM

## 2023-07-18 DIAGNOSIS — Z6836 Body mass index (BMI) 36.0-36.9, adult: Secondary | ICD-10-CM

## 2023-07-18 MED ORDER — LEVOTHYROXINE SODIUM 200 MCG PO TABS: 200.0000 ug | ORAL_TABLET | Freq: Every day | ORAL | 1 refills | Status: DC

## 2023-07-18 NOTE — Patient Instructions (Signed)

## 2023-07-18 NOTE — Progress Notes (Signed)
07/18/2023, 9:36 AM   Endocrinology follow-up note  Subjective:    Patient ID: Tracy Joseph, female    DOB: 02-Jul-1971, PCP Kerri Perches, MD   Past Medical History:  Diagnosis Date   ACE-inhibitor cough    Chronic combined systolic and diastolic heart failure (HCC) 05/04/2022   New dx in 04/2022 during hospitalization. Severe LVH, grade2 diastolic dysfunction, EF 45 to 50 %   Chronic GERD    Essential hypertension    Goiter    Initially hyperthyroid treated with RAI, now hypothyroid - follows with Dr. Everardo All   Helicobacter pylori ab+    Hypertension    Phreesia 10/12/2020   Hypothyroidism    Iron deficiency anemia    Mixed hyperlipidemia    Obstructive sleep apnea    Suspected    Prediabetes    Seasonal allergies    Tubular adenoma of colon    Past Surgical History:  Procedure Laterality Date   BIOPSY  07/25/2022   Procedure: BIOPSY;  Surgeon: Dolores Frame, MD;  [facility]: [facility];  Service: Gastroenterology;;   COLONOSCOPY N/A 05/03/2017   Procedure: COLONOSCOPY;  Surgeon: Malissa Hippo, MD;  Location: [facility];  Service: Endoscopy;  Laterality: N/A;  930   COLONOSCOPY WITH PROPOFOL N/A 07/25/2022   Procedure: COLONOSCOPY WITH PROPOFOL;  Surgeon: Dolores Frame, MD;  Location: [facility];  Service: Gastroenterology;  Laterality: N/A;  1130 am   ESOPHAGOGASTRODUODENOSCOPY (EGD) WITH PROPOFOL N/A 07/25/2022   Procedure: ESOPHAGOGASTRODUODENOSCOPY (EGD) WITH PROPOFOL;  Surgeon: Dolores Frame, MD;  Location: [facility];  Service: Gastroenterology;  Laterality: N/A;   TUBAL LIGATION  2005   Social History   Socioeconomic History   Marital status: Married    Spouse name: Not on file   Number of children: 1   Years of education: Not on file   Highest education level: Not on file  Occupational History   Occupation: full time student for [employer]  Tobacco Use    Smoking status: Never   Smokeless tobacco: Never  Vaping Use   Vaping status: Never Used  Substance and Sexual Activity   Alcohol use: No   Drug use: No   Sexual activity: Yes    Birth control/protection: None, Post-menopausal  Other Topics Concern   Not on file  Social History Narrative   Not on file   Social Determinants of Health   Financial Resource Strain: Low Risk  (09/27/2022)   Overall Financial Resource Strain (CARDIA)    Difficulty of Paying Living Expenses: Not hard at all  Food Insecurity: No Food Insecurity (09/27/2022)   Hunger Vital Sign    Worried About Running Out of Food in the Last Year: Never true    Ran Out of Food in the Last Year: Never true  Transportation Needs: No Transportation Needs (09/27/2022)   PRAPARE - Administrator, Civil Service (Medical): No    Lack of Transportation (Non-Medical): No  Physical Activity: Sufficiently Active (09/27/2022)   Exercise Vital Sign    Days of Exercise per Week: 3 days    Minutes of Exercise per Session: 60 min  Stress: No Stress Concern Present (09/27/2022)   [employer]  of Occupational Health - Occupational Stress Questionnaire    Feeling of Stress : Not at all  Social Connections: Socially Integrated (09/27/2022)   Social Connection and Isolation Panel [NHANES]    Frequency of Communication with Friends and Family: Three times a week    Frequency of Social Gatherings with Friends and Family: Once a week    Attends Religious Services: More than 4 times per year    Active Member of [employer] or Organizations: Yes    Attends Banker Meetings: 1 to 4 times per year    Marital Status: Married   Family History  Problem Relation Age of Onset   Hypertension Mother    Hyperlipidemia Mother    Hypertension Maternal Aunt    Coronary artery disease Maternal Aunt    Hypertension Maternal Aunt    Coronary artery disease Maternal Aunt    Thyroid disease Paternal Grandmother    Hypertension  Cousin    Outpatient Encounter Medications as of 07/18/2023  Medication Sig   amLODipine (NORVASC) 10 MG tablet Take 1 tablet (10 mg total) by mouth daily.   cloNIDine (CATAPRES - DOSED IN MG/24 HR) 0.3 mg/24hr patch Place 1 patch (0.3 mg total) onto the skin once a week.   ferrous sulfate 325 (65 FE) MG EC tablet Take 1 tablet (325 mg total) by mouth daily with breakfast.   furosemide (LASIX) 40 MG tablet Take 1 tablet (40 mg total) by mouth daily.   hydrALAZINE (APRESOLINE) 100 MG tablet TAKE 1 TABLET BY MOUTH TWICE A DAY   isosorbide mononitrate (IMDUR) 60 MG 24 hr tablet Take 1 tablet (60 mg total) by mouth daily.   labetalol (NORMODYNE) 300 MG tablet Take 1 tablet (300 mg total) by mouth 2 (two) times daily. For BP   levothyroxine (SYNTHROID) 200 MCG tablet Take 1 tablet (200 mcg total) by mouth daily before breakfast.   norethindrone (AYGESTIN) 5 MG tablet Take 1 tablet (5 mg total) by mouth daily.   pantoprazole (PROTONIX) 40 MG tablet TAKE 1 TABLET (40 MG TOTAL) BY MOUTH TWICE A DAY BEFORE MEALS   rosuvastatin (CRESTOR) 10 MG tablet Take 1 tablet (10 mg total) by mouth daily.   sacubitril-valsartan (ENTRESTO) 97-103 MG Take 1 tablet by mouth 2 (two) times daily.   vitamin B-12 (CYANOCOBALAMIN) 1000 MCG tablet Take 1 tablet (1,000 mcg total) by mouth daily.   Vitamin D, Ergocalciferol, (DRISDOL) 1.25 MG (50000 UNIT) CAPS capsule TAKE 1 CAPSULE (50,000 UNITS TOTAL) BY MOUTH ONCE A WEEK. ONE CAPSULE ONCE WEEKLY   [DISCONTINUED] levothyroxine (SYNTHROID) 175 MCG tablet TAKE ONE AND AN HALF TABLETS ONCE DAILY EVERY MONDAY, WEDNESDAY AND FRIDAY, THEN TAKE ONE TABLET ONCE DAILY, EVERY TUESDAY, THURSDAY, SATURDAY AND SUNDAY   No facility-administered encounter medications on file as of 07/18/2023.   ALLERGIES: Allergies  Allergen Reactions   Ace Inhibitors Cough   Dust Mite Extract    Latex Itching   Pollen Extract     VACCINATION STATUS: Immunization History  Administered Date(s)  Administered   Influenza,inj,Quad PF,6+ Mos 10/12/2020, 11/08/2021   PFIZER(Purple Top)SARS-COV-2 Vaccination 03/25/2020, 04/23/2020, 11/23/2020   Pneumococcal Polysaccharide-23 12/22/2020   Tdap 04/06/2015    HPI Tracy Joseph is 52 y.o. female who presents today with a medical history as above. 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  According to her records, she underwent RAI thyroid ablation for hyperthyroidism in 2016.  She was given various doses  of levothyroxine over the years.  She is currently on 175 mcg of levothyroxine 1 and half tablets 3 days a week and 1 tablet 4 days a week giving her an average of 212 mcg p.o. daily.  Her pre-visit labs are c/w over-replacement.  she does not have acute symptoms today.  She reports fluctuating body weight.  She denies dysphagia, shortness of breath, nor voice change.  She denies tremors, palpitation, normal heat intolerance. She has prediabetes with A1c of 6.1%.  She is not on treatment.  Her insurance did not provide coverage for [facility]. Her medical history also includes hyperlipidemia with LDL of 140, not currently on treatment.  Review of Systems  Constitutional: + Fluctuating body weight, no fatigue, no subjective hyperthermia, no subjective hypothermia Eyes: no blurry vision, no xerophthalmia   Objective:       07/18/2023    8:44 AM 07/04/2023    8:27 AM 07/04/2023    8:09 AM  Vitals with BMI  Height 5\' 5"     Weight 235 lbs 6 oz    BMI 39.17    Systolic 118 190 440  Diastolic 82 108 91  Pulse 76      BP 118/82   Pulse 76   Ht 5\' 5"  (1.651 m)   Wt 235 lb 6.4 oz (106.8 kg)   BMI 39.17 kg/m   Wt Readings from Last 3 Encounters:  07/18/23 235 lb 6.4 oz (106.8 kg)  07/04/23 228 lb 1.9 oz (103.5 kg)  05/10/23 227 lb (103 kg)    Physical Exam  Constitutional:  Body mass index is 39.17 kg/m.,  not in acute distress, normal state  of mind Eyes: PERRLA, EOMI, no exophthalmos   CMP ( most recent) CMP     Component Value Date/Time   NA 135 06/20/2023 0841   NA 141 04/10/2023 0842   K 3.8 06/20/2023 0841   CL 102 06/20/2023 0841   CO2 25 06/20/2023 0841   GLUCOSE 111 (H) 06/20/2023 0841   BUN 19 06/20/2023 0841   BUN 15 04/10/2023 0842   CREATININE 1.71 (H) 06/20/2023 0841   CREATININE 1.14 (H) 09/16/2019 0847   CALCIUM 9.2 06/20/2023 0841   PROT 7.6 06/20/2023 0841   PROT 6.9 04/10/2023 0842   ALBUMIN 3.9 06/20/2023 0841   ALBUMIN 4.1 04/10/2023 0842   AST 21 06/20/2023 0841   ALT 14 06/20/2023 0841   ALKPHOS 73 06/20/2023 0841   BILITOT 0.7 06/20/2023 0841   BILITOT 0.5 04/10/2023 0842   GFRNONAA 35 (L) 06/20/2023 0841   GFRNONAA 56 (L) 09/16/2019 0847   GFRAA 53 (L) 11/10/2020 0938   GFRAA 65 09/16/2019 0847     Diabetic Labs (most recent): Lab Results  Component Value Date   HGBA1C 6.1 07/04/2023   HGBA1C 6.0 (H) 01/03/2023   HGBA1C 6.1 03/08/2022     Lipid Panel ( most recent) Lipid Panel     Component Value Date/Time   CHOL 111 04/10/2023 0842   TRIG 88 04/10/2023 0842   HDL 43 04/10/2023 0842   CHOLHDL 2.6 04/10/2023 0842   CHOLHDL 4.7 09/16/2019 0847   VLDL 23 01/24/2017 0916   LDLCALC 51 04/10/2023 0842   LDLCALC 136 (H) 09/16/2019 0847   LABVLDL 17 04/10/2023 0842      Lab Results  Component Value Date   TSH 1.330 06/20/2023   TSH 0.035 (L) 10/06/2022   TSH 2.640 05/17/2022   TSH 21.400 (H) 03/08/2022   TSH 1.460 04/26/2021   TSH 5.99 (  H) 04/29/2019   TSH 5.25 (H) 07/05/2017   TSH 6.50 (H) 05/07/2017   TSH 10.37 (H) 01/24/2017   TSH 7.66 (H) 04/24/2016   FREET4 1.09 06/20/2023   FREET4 2.15 (H) 10/06/2022   FREET4 0.31 (L) 04/06/2015      Assessment & Plan:   1. Hypothyroidism following radioiodine therapy 2. Prediabetes 3. Mixed hyperlipidemia  Her levothyroxine dose will be adjusted at 200 mcg p.o. daily before breakfast.  - We discussed about the  correct intake of her thyroid hormone, on empty stomach at fasting, with water, separated by at least 30 minutes from breakfast and other medications,  and separated by more than 4 hours from calcium, iron, multivitamins, acid reflux medications (PPIs). -Patient is made aware of the fact that thyroid hormone replacement is needed for life, dose to be adjusted by periodic monitoring of thyroid function tests.  Regarding her dyslipidemia will has prediabetes, obesity, hesitant to go on any statins.  She has not engaged with lifestyle medicine yet at would like to try again.   - she acknowledges that there is a room for improvement in her food and drink choices. - Suggestion is made for her to avoid simple carbohydrates  from her diet including Cakes, Sweet Desserts, Ice Cream, Soda (diet and regular), Sweet Tea, Candies, Chips, Cookies, Store Bought Juices, Alcohol , Artificial Sweeteners,  Coffee Creamer, and "Sugar-free" Products, Lemonade. This will help patient to have more stable blood glucose profile and potentially avoid unintended weight gain.  The following Lifestyle Medicine recommendations according to American College of Lifestyle Medicine  [facility]) were discussed and and offered to patient and she  agrees to start the journey:  A. Whole Foods, Plant-Based Nutrition comprising of fruits and vegetables, plant-based proteins, whole-grain carbohydrates was discussed in detail with the patient.   A list for source of those nutrients were also provided to the patient.  Patient will use only water or unsweetened tea for hydration. B.  The need to stay away from risky substances including alcohol, smoking; obtaining 7 to 9 hours of restorative sleep, at least 150 minutes of moderate intensity exercise weekly, the importance of healthy social connections,  and stress management techniques were discussed. C.  A full color page of  Calorie density of various food groups per pound showing examples of each  food groups was provided to the patient.   - she is advised to maintain close follow up with Kerri Perches, MD for primary care needs.   I spent  26  minutes in the care of the patient today including review of labs from Thyroid Function, CMP, and other relevant labs ; imaging/biopsy records (current and previous including abstractions from other facilities); face-to-face time discussing  her lab results and symptoms, medications doses, her options of short and long term treatment based on the latest standards of care / guidelines;   and documenting the encounter.  Shonna Chock  participated in the discussions, expressed understanding, and voiced agreement with the above plans.  All questions were answered to her satisfaction. she is encouraged to contact clinic should she have any questions or concerns prior to her return visit.  Follow up plan: Return in about 6 months (around 01/18/2024) for Fasting Labs  in AM B4 8, A1c -NV.   Marquis Lunch, MD [facility] [address] [place], [place] [address] Phone: [phone]  Fax: [phone]     07/18/2023, 9:36 AM  This note was partially  dictated with voice recognition software. Similar sounding words can be transcribed inadequately or may not  be corrected upon review.

## 2023-07-30 ENCOUNTER — Other Ambulatory Visit: Payer: Self-pay | Admitting: Family Medicine

## 2023-08-01 ENCOUNTER — Telehealth: Payer: Self-pay | Admitting: Family Medicine

## 2023-08-01 NOTE — Telephone Encounter (Signed)
Called patient to schedule DM eye exam, Patient goes to eye dr Hyacinth Meeker on [address] [place]

## 2023-08-01 NOTE — Telephone Encounter (Signed)
Records requested

## 2023-08-02 LAB — HM DIABETES EYE EXAM

## 2023-08-22 ENCOUNTER — Encounter: Payer: Self-pay | Admitting: Family Medicine

## 2023-08-31 ENCOUNTER — Telehealth: Payer: Self-pay | Admitting: Family Medicine

## 2023-08-31 NOTE — Telephone Encounter (Signed)
Patient LVM wanting to know if we received her paperwork for her bariatric surgery. Please advise Thank you

## 2023-09-03 NOTE — Telephone Encounter (Signed)
Has anything been received?

## 2023-09-04 NOTE — Telephone Encounter (Signed)
Have not received no paperwork.

## 2023-09-04 NOTE — Telephone Encounter (Signed)
Forms received will work on letter needed and call patient when complete

## 2023-09-19 ENCOUNTER — Other Ambulatory Visit (HOSPITAL_COMMUNITY): Payer: Self-pay | Admitting: Surgery

## 2023-09-26 ENCOUNTER — Encounter (HOSPITAL_COMMUNITY): Payer: Self-pay

## 2023-09-26 ENCOUNTER — Other Ambulatory Visit: Payer: Self-pay

## 2023-09-26 ENCOUNTER — Emergency Department (HOSPITAL_COMMUNITY)
Admission: EM | Admit: 2023-09-26 | Discharge: 2023-09-26 | Disposition: A | Discharge status: Home / Self Care | Payer: Managed Care, Other (non HMO) | Attending: Emergency Medicine | Admitting: Emergency Medicine

## 2023-09-26 DIAGNOSIS — Z91040 Latex allergy status: Secondary | ICD-10-CM | POA: Diagnosis not present

## 2023-09-26 DIAGNOSIS — I10 Essential (primary) hypertension: Secondary | ICD-10-CM | POA: Diagnosis present

## 2023-09-26 DIAGNOSIS — I1A0 Resistant hypertension: Secondary | ICD-10-CM | POA: Diagnosis not present

## 2023-09-26 DIAGNOSIS — Z79899 Other long term (current) drug therapy: Secondary | ICD-10-CM | POA: Insufficient documentation

## 2023-09-26 MED ORDER — HYDROCHLOROTHIAZIDE 25 MG PO TABS
25.0000 mg | ORAL_TABLET | Freq: Every day | ORAL | Status: DC
  Administered 2023-09-26: 25 mg via ORAL
  Filled 2023-09-26: qty 1

## 2023-09-26 NOTE — ED Triage Notes (Signed)
C/o checking bp and systolic was 265 PTA. Also c/o headache. Pt reports been out of hydrochlorothiazide x3 days.  Denies cp/sob.  Hx of CHF

## 2023-09-26 NOTE — Discharge Instructions (Signed)
Your EKG is unchanged from prior EKGs, your blood pressure has improved spontaneously but you need to make sure you are taking your medications exactly as prescribed.  Return to the ER for severe worsening symptoms  Thank you for allowing [place] to treat you in the emergency department today.  After reviewing your examination and potential testing that was done it appears that you are safe to go home.  I would like for you to follow-up with your doctor within the next several days, have them obtain your records and follow-up with them to review all potential tests and results from your visit.  If you should develop severe or worsening symptoms return to the emergency department immediately

## 2023-09-26 NOTE — ED Provider Notes (Signed)
[place] EMERGENCY DEPARTMENT AT [facility] Provider Note   CSN: [id number] Arrival date & time: 09/26/23  2108     History  Chief Complaint  Patient presents with   Hypertension    Tracy Joseph is a 52 y.o. female.   Hypertension   This patient is a 52 year old female with a history of hypertension on multiple medications including clonidine, hydrochlorothiazide, hydralazine, Entresto.  She had recently run out of medications including her Entresto and her hydrochlorothiazide, she has been without them for a couple of days.  Tonight she noticed that she was having some symptoms including a headache, she had measured her blood pressure which was well over 200 systolic prompting her visit to the ER.  On arrival her blood pressure is down to 160/110 and she states that she has no headache no chest pain no palpitations no numbness no weakness no changes in vision no difficulty with speech.    Home Medications Prior to Admission medications   Medication Sig Start Date End Date Taking? Authorizing Provider  amLODipine (NORVASC) 10 MG tablet Take 1 tablet (10 mg total) by mouth daily. Patient not taking: Reported on 09/26/2023 05/10/23   Alver Sorrow, NP  cloNIDine (CATAPRES - DOSED IN MG/24 HR) 0.3 mg/24hr patch Place 1 patch (0.3 mg total) onto the skin once a week. 01/03/23   Chilton Si, MD  ferrous sulfate 325 (65 FE) MG EC tablet Take 1 tablet (325 mg total) by mouth daily with breakfast. 05/07/22   Shon Hale, MD  furosemide (LASIX) 40 MG tablet Take 1 tablet (40 mg total) by mouth daily. 05/10/23 05/09/24  Alver Sorrow, NP  hydrALAZINE (APRESOLINE) 100 MG tablet TAKE 1 TABLET BY MOUTH TWICE A DAY 04/16/23   Kerri Perches, MD  isosorbide mononitrate (IMDUR) 60 MG 24 hr tablet Take 1 tablet (60 mg total) by mouth daily. 05/10/23   Alver Sorrow, NP  labetalol (NORMODYNE) 300 MG tablet Take 1 tablet (300 mg total) by mouth 2 (two) times daily.  For BP 05/10/23   Alver Sorrow, NP  levothyroxine (SYNTHROID) 200 MCG tablet Take 1 tablet (200 mcg total) by mouth daily before breakfast. 07/18/23   Nida, Denman George, MD  norethindrone (AYGESTIN) 5 MG tablet Take 1 tablet (5 mg total) by mouth daily. 09/27/22 03/28/23  Myna Hidalgo, DO  pantoprazole (PROTONIX) 40 MG tablet TAKE 1 TABLET (40 MG TOTAL) BY MOUTH TWICE A DAY BEFORE MEALS 07/30/23   Kerri Perches, MD  rosuvastatin (CRESTOR) 10 MG tablet Take 1 tablet (10 mg total) by mouth daily. 02/22/23   Alver Sorrow, NP  sacubitril-valsartan (ENTRESTO) 97-103 MG Take 1 tablet by mouth 2 (two) times daily. 03/21/23   Chilton Si, MD  vitamin B-12 (CYANOCOBALAMIN) 1000 MCG tablet Take 1 tablet (1,000 mcg total) by mouth daily. 05/07/22   Shon Hale, MD  Vitamin D, Ergocalciferol, (DRISDOL) 1.25 MG (50000 UNIT) CAPS capsule TAKE 1 CAPSULE (50,000 UNITS TOTAL) BY MOUTH ONCE A WEEK. ONE CAPSULE ONCE WEEKLY 01/01/23   Kerri Perches, MD      Allergies    Ace inhibitors, Dust mite extract, Pollen extract, and Latex    Review of Systems   Review of Systems  All other systems reviewed and are negative.   Physical Exam Updated Vital Signs BP (!) 179/125 (BP Location: Right Arm)   Pulse 68   Temp 98.6 F (37 C) (Oral)   Resp 18   Wt 107 kg  SpO2 96%   BMI 39.27 kg/m  Physical Exam Vitals and nursing note reviewed.  Constitutional:      General: She is not in acute distress.    Appearance: She is well-developed.  HENT:     Head: Normocephalic and atraumatic.     Mouth/Throat:     Pharynx: No oropharyngeal exudate.  Eyes:     General: No scleral icterus.       Right eye: No discharge.        Left eye: No discharge.     Conjunctiva/sclera: Conjunctivae normal.     Pupils: Pupils are equal, round, and reactive to light.  Neck:     Thyroid: No thyromegaly.     Vascular: No JVD.  Cardiovascular:     Rate and Rhythm: Normal rate and regular rhythm.      Heart sounds: Normal heart sounds. No murmur heard.    No friction rub. No gallop.  Pulmonary:     Effort: Pulmonary effort is normal. No respiratory distress.     Breath sounds: Normal breath sounds. No wheezing or rales.  Abdominal:     General: Bowel sounds are normal. There is no distension.     Palpations: Abdomen is soft. There is no mass.     Tenderness: There is no abdominal tenderness.  Musculoskeletal:        General: No tenderness. Normal range of motion.     Cervical back: Normal range of motion and neck supple.  Lymphadenopathy:     Cervical: No cervical adenopathy.  Skin:    General: Skin is warm and dry.     Findings: No erythema or rash.  Neurological:     Mental Status: She is alert.     Coordination: Coordination normal.     Comments: Speech is clear, cranial nerves III through XII are intact, memory is intact, strength is normal in all 4 extremities including grips and strength at the bilateral thighs, knees and ankles to extention and flexion, sensation is intact to light touch and pinprick in all 4 extremities. Coordination as tested by finger-nose-finger is normal, no limb ataxia. Normal gait, normal reflexes at the patellar tendons bilaterally  Psychiatric:        Behavior: Behavior normal.     ED Results / Procedures / Treatments   Labs (all labs ordered are listed, but only abnormal results are displayed) Labs Reviewed - No data to display  EKG EKG Interpretation Date/Time:  Wednesday September 26 2023 23:20:44 EDT Ventricular Rate:  63 PR Interval:  197 QRS Duration:  97 QT Interval:  464 QTC Calculation: 475 R Axis:   48  Text Interpretation: Sinus rhythm Anterior infarct, old Borderline T abnormalities, inferior leads since last tracing no significant change Confirmed by Eber Hong ([id number]) on 09/26/2023 11:30:03 PM  Radiology No results found.  Procedures Procedures    Medications Ordered in ED Medications  hydrochlorothiazide  (HYDRODIURIL) tablet 25 mg (25 mg Oral Given 09/26/23 2317)    ED Course/ Medical Decision Making/ A&P                                 Medical Decision Making Amount and/or Complexity of Data Reviewed ECG/medicine tests: ordered.  Risk Prescription drug management.   Overall patient is well-appearing, blood pressure has spontaneously improved, she will get a dose of her hydrochlorothiazide, states she has medicines to pick up tomorrow at the pharmacy.  She reports  that she has been hypertensive for years, approximately 20 years and is on multiple different medications so it is not surprising that missing a couple of those medicines for the last few days has caused some resultant rebound hypertension.  She is stable for discharge, EKG ordered and unremarkable, patient agreeable to the plan  EKG unremarkable, there are T wave abnormalities that have been seen on multiple prior EKGs and are likely some degree of LVH and constant hypertension   Patient given medications for her hypertension prior to discharge        Final Clinical Impression(s) / ED Diagnoses Final diagnoses:  Resistant hypertension    Rx / DC Orders ED Discharge Orders     None         Eber Hong, MD 09/26/23 2331

## 2023-10-01 ENCOUNTER — Ambulatory Visit: Payer: Managed Care, Other (non HMO) | Admitting: Dietician

## 2023-10-03 ENCOUNTER — Ambulatory Visit: Payer: Managed Care, Other (non HMO) | Admitting: Gastroenterology

## 2023-10-19 ENCOUNTER — Ambulatory Visit: Payer: Managed Care, Other (non HMO) | Admitting: Dietician

## 2023-11-14 ENCOUNTER — Ambulatory Visit: Payer: Managed Care, Other (non HMO) | Admitting: Family Medicine

## 2023-12-13 ENCOUNTER — Encounter: Payer: Self-pay | Admitting: Family Medicine

## 2023-12-13 ENCOUNTER — Other Ambulatory Visit (HOSPITAL_COMMUNITY): Payer: Self-pay | Admitting: Family Medicine

## 2023-12-13 ENCOUNTER — Ambulatory Visit (INDEPENDENT_AMBULATORY_CARE_PROVIDER_SITE_OTHER): Payer: Managed Care, Other (non HMO) | Admitting: Family Medicine

## 2023-12-13 VITALS — BP 173/121 | HR 75 | Ht 65.0 in | Wt 238.1 lb

## 2023-12-13 DIAGNOSIS — I1A0 Resistant hypertension: Secondary | ICD-10-CM

## 2023-12-13 DIAGNOSIS — E782 Mixed hyperlipidemia: Secondary | ICD-10-CM

## 2023-12-13 DIAGNOSIS — R7303 Prediabetes: Secondary | ICD-10-CM

## 2023-12-13 DIAGNOSIS — Z1231 Encounter for screening mammogram for malignant neoplasm of breast: Secondary | ICD-10-CM

## 2023-12-13 DIAGNOSIS — R1011 Right upper quadrant pain: Secondary | ICD-10-CM | POA: Insufficient documentation

## 2023-12-13 DIAGNOSIS — E890 Postprocedural hypothyroidism: Secondary | ICD-10-CM

## 2023-12-13 DIAGNOSIS — E559 Vitamin D deficiency, unspecified: Secondary | ICD-10-CM

## 2023-12-13 DIAGNOSIS — K219 Gastro-esophageal reflux disease without esophagitis: Secondary | ICD-10-CM

## 2023-12-13 MED ORDER — MONTELUKAST SODIUM 10 MG PO TABS: 10.0000 mg | ORAL_TABLET | Freq: Every day | ORAL | 3 refills | Status: DC

## 2023-12-13 MED ORDER — AZELASTINE HCL 0.1 % NA SOLN: 2.0000 | Freq: Two times a day (BID) | NASAL | 3 refills | Status: DC

## 2023-12-13 MED ORDER — PREDNISONE 5 MG PO TABS: 5.0000 mg | ORAL_TABLET | Freq: Two times a day (BID) | ORAL | 0 refills | Status: AC

## 2023-12-13 NOTE — Patient Instructions (Addendum)
Follow-up reevaluate blood pressure and review labs in 3 to 4 weeks, call if you need me sooner.    It is vital that you take your blood pressure medicine on a regular schedule for return to be effective.  Your blood pressure is excessively high.  I have advised that you go to the emergency room to get this controlled however you tell me that you have not had your medicine today please take your blood pressure medicine and commit to taking it regularly 12 hours apart for those that you to take twice daily.  If you develop new headache weakness numbness blurred vision difficulty with speech you need to go directly to the emergency room.  Labs today lipids CMP and EGFR HbA1c TSH, free T4,  CBC and vitamin D.  You are referred for an ultrasound of your liver and gallbladder because of a 1 month history of vomiting and nausea with bloating.  Medications need to be taken regularly for uncontrolled allergy symptoms over-the-counter loratadine 10 mg 1 twice daily which is generic  Claritin  I am prescribing once daily montelukast as well as Astelin nasal spray also prednisone 5 mg 1 twice daily for 5 days only.  You do need to follow-up with the GI specialist with this nausea and vomiting tests have been ordered which you still have not had done.  Thanks for choosing [facility], we consider it a privelige to serve you.

## 2023-12-13 NOTE — Assessment & Plan Note (Signed)
Updated lab needed at/ before next visit.   

## 2023-12-13 NOTE — Progress Notes (Signed)
FAIREN DELISIO     MRN: [id number]      DOB: [birth year]  Chief Complaint  Patient presents with   Follow-up    Follow up    HPI Ms. Guinane is here for follow up and re-evaluation of chronic medical conditions, medication management and review of any available recent lab and radiology data.  Initially reported compliance with BP meds, does not have bottles , when I advised ED eval stated she has not been compliant Denies headache or amy weakness, numbness, emesis Head congestion and in a barrel x 6 weeks, no fever or chills , drainage is clear Using humidifier, nose stopped up , cannot breathe Complains of 1 month history of intermittent nausea bloating and vomiting, denies fever or chills.    ROS Denies recent fever or chills. Slight cough, no significant sputum production,  Denies chest pains, palpitations and leg swelling Denies dysuria, frequency, hesitancy or incontinence. Denies joint pain, swelling and limitation in mobility. Denies headaches, seizures, numbness, or tingling. Denies depression, anxiety or insomnia. Denies skin break down or rash.   PE  BP (!) 173/121 (BP Location: Left Arm, Patient Position: Sitting, Cuff Size: Large)   Pulse 75   Ht 5\' 5"  (1.651 m)   Wt 238 lb 1.9 oz (108 kg)   SpO2 92%   BMI 39.63 kg/m   Patient alert and oriented  HEENT: No facial asymmetry, EOMI,     Neck supple .Nasal and sinus congestion, no sinus tenderness, no cervical adenopathy  Chest: Clear to auscultation bilaterally.No wheezwes or crackles  CVS: S1, S2 no murmurs, no S3.Regular rate.  ABD: Soft no   MS: Adequate ROM spine, shoulders, hips and knees.  Skin: Intact, no ulcerations or rash noted.  Psych: Good eye contact, normal affect. Memory intact not anxious or depressed appearing.  CNS: CN 2-12 intact, power,  normal throughout.no focal deficits noted.   Assessment & Plan  RUQ pain 1 month history, intermittent , concerning  fopr cholelithiasis,  obtain US, needs gI re eval also  Resistant hypertension Uncontrolled due to med non compliance, discussed importance of changing this behavior with pt again DASH diet and commitment to daily physical activity for a minimum of 30 minutes discussed and encouraged, as a part of hypertension management. The importance of attaining a healthy weight is also discussed.     12/13/2023    8:09 AM 12/13/2023    8:07 AM 09/26/2023   10:44 PM 09/26/2023   10:40 PM 09/26/2023   10:30 PM 09/26/2023    9:21 PM 09/26/2023    9:20 PM  BP/Weight  Systolic BP 173 223 179 179 173 193   Diastolic BP 121 120 125 125 113 119   Wt. (Lbs)  238.12     236  BMI  39.63 kg/m2     39.27 kg/m2       Prediabetes Patient educated about the importance of limiting  Carbohydrate intake , the need to commit to daily physical activity for a minimum of 30 minutes , and to commit weight loss. The fact that changes in all these areas will reduce or eliminate all together the development of diabetes is stressed.      Latest Ref Rng & Units 07/04/2023    8:28 AM 06/20/2023    8:41 AM 04/10/2023    8:42 AM 01/03/2023    8:45 AM 01/03/2023    8:44 AM  Diabetic Labs  HbA1c 0.0 - 7.0 % 6.1    6.0  Chol 100 - 199 mg/dL   161   096   HDL >04 mg/dL   43   36   Calc LDL 0 - 99 mg/dL   51   540   Triglycerides 0 - 149 mg/dL   88   92   Creatinine 0.44 - 1.00 mg/dL  9.81  1.91  4.78        12/13/2023    8:09 AM 12/13/2023    8:07 AM 09/26/2023   10:44 PM 09/26/2023   10:40 PM 09/26/2023   10:30 PM 09/26/2023    9:21 PM 09/26/2023    9:20 PM  BP/Weight  Systolic BP 173 223 179 179 173 193   Diastolic BP 121 120 125 125 113 119   Wt. (Lbs)  238.12     236  BMI  39.63 kg/m2     39.27 kg/m2      Latest Ref Rng & Units 08/02/2023    8:25 AM 11/07/2022   12:00 AM  Foot/eye exam completion dates  Eye Exam No Retinopathy No Retinopathy     No Retinopathy         This result is from an external source.    Updated lab  needed at/ before next visit.   Mixed hyperlipidemia Hyperlipidemia:Low fat diet discussed and encouraged.   Lipid Panel  Lab Results  Component Value Date   CHOL 111 04/10/2023   HDL 43 04/10/2023   LDLCALC 51 04/10/2023   TRIG 88 04/10/2023   CHOLHDL 2.6 04/10/2023     Updated lab needed at/ before next visit.   GERD (gastroesophageal reflux disease) Uncontrolled currently and reporting symptoms c/w solid dysphagia, needs GI re eval, swallow study ordered in 06/2023 not done  Hypothyroidism following radioiodine therapy Updated lab needed at/ before next visit.   Vitamin D deficiency Updated lab needed at/ before next visit.

## 2023-12-13 NOTE — Assessment & Plan Note (Signed)
Uncontrolled due to med non compliance, discussed importance of changing this behavior with pt again DASH diet and commitment to daily physical activity for a minimum of 30 minutes discussed and encouraged, as a part of hypertension management. The importance of attaining a healthy weight is also discussed.     12/13/2023    8:09 AM 12/13/2023    8:07 AM 09/26/2023   10:44 PM 09/26/2023   10:40 PM 09/26/2023   10:30 PM 09/26/2023    9:21 PM 09/26/2023    9:20 PM  BP/Weight  Systolic BP 173 223 179 179 173 193   Diastolic BP 121 120 125 125 113 119   Wt. (Lbs)  238.12     236  BMI  39.63 kg/m2     39.27 kg/m2

## 2023-12-13 NOTE — Assessment & Plan Note (Signed)
Patient educated about the importance of limiting  Carbohydrate intake , the need to commit to daily physical activity for a minimum of 30 minutes , and to commit weight loss. The fact that changes in all these areas will reduce or eliminate all together the development of diabetes is stressed.      Latest Ref Rng & Units 07/04/2023    8:28 AM 06/20/2023    8:41 AM 04/10/2023    8:42 AM 01/03/2023    8:45 AM 01/03/2023    8:44 AM  Diabetic Labs  HbA1c 0.0 - 7.0 % 6.1    6.0    Chol 100 - 199 mg/dL   161   096   HDL >04 mg/dL   43   36   Calc LDL 0 - 99 mg/dL   51   540   Triglycerides 0 - 149 mg/dL   88   92   Creatinine 0.44 - 1.00 mg/dL  9.81  1.91  4.78        12/13/2023    8:09 AM 12/13/2023    8:07 AM 09/26/2023   10:44 PM 09/26/2023   10:40 PM 09/26/2023   10:30 PM 09/26/2023    9:21 PM 09/26/2023    9:20 PM  BP/Weight  Systolic BP 173 223 179 179 173 193   Diastolic BP 121 120 125 125 113 119   Wt. (Lbs)  238.12     236  BMI  39.63 kg/m2     39.27 kg/m2      Latest Ref Rng & Units 08/02/2023    8:25 AM 11/07/2022   12:00 AM  Foot/eye exam completion dates  Eye Exam No Retinopathy No Retinopathy     No Retinopathy         This result is from an external source.    Updated lab needed at/ before next visit.

## 2023-12-13 NOTE — Assessment & Plan Note (Signed)
Hyperlipidemia:Low fat diet discussed and encouraged.   Lipid Panel  Lab Results  Component Value Date   CHOL 111 04/10/2023   HDL 43 04/10/2023   LDLCALC 51 04/10/2023   TRIG 88 04/10/2023   CHOLHDL 2.6 04/10/2023     Updated lab needed at/ before next visit.

## 2023-12-13 NOTE — Assessment & Plan Note (Signed)
Uncontrolled currently and reporting symptoms c/w solid dysphagia, needs GI re eval, swallow study ordered in 06/2023 not done

## 2023-12-13 NOTE — Assessment & Plan Note (Signed)
1 month history, intermittent , concerning  fopr cholelithiasis, obtain US, needs gI re eval also

## 2023-12-14 LAB — CMP14+EGFR
ALT: 11 [IU]/L (ref 0–32)
AST: 18 [IU]/L (ref 0–40)
Albumin: 4.1 g/dL (ref 3.8–4.9)
Alkaline Phosphatase: 96 [IU]/L (ref 44–121)
BUN/Creatinine Ratio: 11 (ref 9–23)
BUN: 14 mg/dL (ref 6–24)
Bilirubin Total: 0.4 mg/dL (ref 0.0–1.2)
CO2: 22 mmol/L (ref 20–29)
Calcium: 9.6 mg/dL (ref 8.7–10.2)
Chloride: 101 mmol/L (ref 96–106)
Creatinine, Ser: 1.24 mg/dL — ABNORMAL HIGH (ref 0.57–1.00)
Globulin, Total: 3.4 g/dL (ref 1.5–4.5)
Glucose: 110 mg/dL — ABNORMAL HIGH (ref 70–99)
Potassium: 3.9 mmol/L (ref 3.5–5.2)
Sodium: 138 mmol/L (ref 134–144)
Total Protein: 7.5 g/dL (ref 6.0–8.5)
eGFR: 52 mL/min/{1.73_m2} — ABNORMAL LOW (ref >59)

## 2023-12-14 LAB — LIPID PANEL
Chol/HDL Ratio: 3.1 {ratio} (ref 0.0–4.4)
Cholesterol, Total: 167 mg/dL (ref 100–199)
HDL: 54 mg/dL (ref >39)
LDL Chol Calc (NIH): 89 mg/dL (ref 0–99)
Triglycerides: 138 mg/dL (ref 0–149)
VLDL Cholesterol Cal: 24 mg/dL (ref 5–40)

## 2023-12-14 LAB — CBC
Hematocrit: 44.5 % (ref 34.0–46.6)
Hemoglobin: 14.9 g/dL (ref 11.1–15.9)
MCH: 32.8 pg (ref 26.6–33.0)
MCHC: 33.5 g/dL (ref 31.5–35.7)
MCV: 98 fL — ABNORMAL HIGH (ref 79–97)
Platelets: 242 10*3/uL (ref 150–450)
RBC: 4.54 x10E6/uL (ref 3.77–5.28)
RDW: 13.1 % (ref 11.7–15.4)
WBC: 8.2 10*3/uL (ref 3.4–10.8)

## 2023-12-14 LAB — T4, FREE: Free T4: 0.95 ng/dL (ref 0.82–1.77)

## 2023-12-14 LAB — TSH: TSH: 15 u[IU]/mL — ABNORMAL HIGH (ref 0.450–4.500)

## 2023-12-14 LAB — HEMOGLOBIN A1C
Est. average glucose Bld gHb Est-mCnc: 137 mg/dL
Hgb A1c MFr Bld: 6.4 % — ABNORMAL HIGH (ref 4.8–5.6)

## 2023-12-14 LAB — VITAMIN D 25 HYDROXY (VIT D DEFICIENCY, FRACTURES): Vit D, 25-Hydroxy: 28.8 ng/mL — ABNORMAL LOW (ref 30.0–100.0)

## 2023-12-16 ENCOUNTER — Other Ambulatory Visit (HOSPITAL_BASED_OUTPATIENT_CLINIC_OR_DEPARTMENT_OTHER): Payer: Self-pay | Admitting: Family

## 2023-12-21 ENCOUNTER — Ambulatory Visit (HOSPITAL_COMMUNITY)
Admission: RE | Admit: 2023-12-21 | Discharge: 2023-12-21 | Disposition: A | Discharge status: Home / Self Care | Payer: Managed Care, Other (non HMO) | Source: Ambulatory Visit | Attending: Family Medicine | Admitting: Family Medicine

## 2023-12-21 DIAGNOSIS — R1011 Right upper quadrant pain: Secondary | ICD-10-CM | POA: Insufficient documentation

## 2023-12-30 NOTE — Progress Notes (Signed)
 12/31/2023 Tracy Joseph 23-Aug-1971 [id number]   HPI:  Tracy Joseph is a 53 y.o. female patient of Dr Raford, with a PMH below who presents today for advanced hypertension clinic follow up.  She was seen  by Dr. Santo just 2 years ago as a consult for malignant hypertension, and at that first visit had a pressure of 205/135, dropping to 200/110 in the office.  Because of worsening kidney function, he started her on labetalol  100 mg bid.  At follow up with [facility] Bolk pressure was still at 200/130.  She was lost to follow up until last month, when she was referred from her PCP to [facility].  She has been seen multiple times in the past 2 years, secondary workup negative for hyperaldosteronism.  Also reports at each visit that has been out of at least 1 medication for several days.    Today she returns for follow up.  Normally takes her morning meds in the late morning, but left the house without them today.  Did find one Entresto  tablet in her purse and took that.  States she doesn't usually forget, this was only missed dose in the past few weeks.    Unit dose  - feso4?, check synthroid  dose  Past Medical History: hyperlipidemia 3/23 LDL 140, currently not on medication  CKD Stage 3b  CHF HFmrEF at 45-50%, on labetalol , Entresto ; grade 2 diastolic dysfunction  DM2 3/23 A1c 6.1; was as high as 7.5 in 2020  OSA Study positive, has upcoming appointment  hypothyroid 10/23 TSH 0.035, T4 2.15; levothyroxine  decreased to 150 mcg    Blood Pressure Goal:  130/80  Current Medications:   amlodipine  10 mg qd, clonidine  0.2 mg patch qSunday hydralazine  100 mg bid, labetalol  300 mg bid, Entresto  97/103 bid  Family Hx:  strong family history of hypertension; all of her cousins are on 5+ meds, mother and aunts good with 1-2 meds; maternal GM died stroke; 2 aunts with CABG, father family more cancers; has 1 daughter, no diagnosed hypertension  Social Hx: no  tobacco, only occasional alcohol; no soda in 2 weeks, does drink coffee when cold outside [place])      Diet:  Started Toribio fast on Jan 1 for 21 days - vegetables, no meats or snack foods; eating fish and eggs for protein, lots of vegetables  Exercise: not much recently, usually goes to gym with mother, but mother has been unable to go for past few weeks  Home BP readings:  4 home readings with her today, average 169/89 (Last month 7 readings average 159/95)    Intolerances:  Ace inhibitors - cough Amlodipine  - LE edema Benicar  - cough, dry, pruritis  Labs: 8/23:  Na 137, K 3.6, Glu 84, BUN 16, SCr 1.26, GFR 51   Wt Readings from Last 3 Encounters:  12/13/23 238 lb 1.9 oz (108 kg)  09/26/23 236 lb (107 kg)  07/18/23 235 lb 6.4 oz (106.8 kg)   BP Readings from Last 3 Encounters:  12/31/23 (!) 173/108  12/13/23 (!) 173/121  09/26/23 (!) 179/125   Pulse Readings from Last 3 Encounters:  12/31/23 80  12/13/23 75  09/26/23 68    Current Outpatient Medications  Medication Sig Dispense Refill   amLODipine  (NORVASC ) 10 MG tablet Take 1 tablet (10 mg total) by mouth daily. 90 tablet 3   azelastine  (ASTELIN ) 0.1 % nasal spray Place 2 sprays into both nostrils 2 (two) times daily. Use in each  nostril as directed 30 mL 3   cloNIDine  (CATAPRES  - DOSED IN MG/24 HR) 0.3 mg/24hr patch Place 1 patch (0.3 mg total) onto the skin once a week. 4 patch 12   ferrous sulfate  325 (65 FE) MG EC tablet Take 1 tablet (325 mg total) by mouth daily with breakfast. 90 tablet 3   furosemide  (LASIX ) 40 MG tablet Take 1 tablet (40 mg total) by mouth daily. 90 tablet 3   hydrALAZINE  (APRESOLINE ) 100 MG tablet TAKE 1 TABLET BY MOUTH TWICE A DAY 180 tablet 2   isosorbide  mononitrate (IMDUR ) 60 MG 24 hr tablet Take 1 tablet (60 mg total) by mouth daily. 90 tablet 3   labetalol  (NORMODYNE ) 300 MG tablet Take 1 tablet (300 mg total) by mouth 2 (two) times daily. For BP 180 tablet 3   levothyroxine  (SYNTHROID )  200 MCG tablet Take 1 tablet (200 mcg total) by mouth daily before breakfast. 90 tablet 1   montelukast  (SINGULAIR ) 10 MG tablet Take 1 tablet (10 mg total) by mouth at bedtime. 30 tablet 3   norethindrone  (AYGESTIN ) 5 MG tablet Take 1 tablet (5 mg total) by mouth daily. 90 tablet 4   pantoprazole  (PROTONIX ) 40 MG tablet TAKE 1 TABLET (40 MG TOTAL) BY MOUTH TWICE A DAY BEFORE MEALS 180 tablet 1   rosuvastatin  (CRESTOR ) 10 MG tablet TAKE 1 TABLET BY MOUTH EVERY DAY 90 tablet 1   sacubitril -valsartan  (ENTRESTO ) 97-103 MG Take 1 tablet by mouth 2 (two) times daily. 180 tablet 3   spironolactone  (ALDACTONE ) 25 MG tablet Take 1 tablet (25 mg total) by mouth daily. 30 tablet 3   vitamin B-12 (CYANOCOBALAMIN ) 1000 MCG tablet Take 1 tablet (1,000 mcg total) by mouth daily. 30 tablet 3   Vitamin D , Ergocalciferol , (DRISDOL ) 1.25 MG (50000 UNIT) CAPS capsule TAKE 1 CAPSULE (50,000 UNITS TOTAL) BY MOUTH ONCE A WEEK. ONE CAPSULE ONCE WEEKLY 12 capsule 2   No current facility-administered medications for this visit.    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 Severe LVH, grade2 diastolic dysfunction, EF 45 to 50 %   Chronic GERD    Essential hypertension    Goiter    Initially hyperthyroid treated with RAI, now hypothyroid - follows with Dr. Kassie   Helicobacter pylori ab+    Hypertension    [employer]reesia 10/12/2020   Hypothyroidism    Iron  deficiency anemia    Mixed hyperlipidemia    Obstructive sleep apnea    Suspected    Prediabetes    Seasonal allergies    Tubular adenoma of colon     Blood pressure (!) 173/108, pulse 80.  HYPERTENSION CONTROL Vitals:   12/31/23 1418 12/31/23 1425  BP: (!) 171/131 (!) 173/108    The patient's  blood pressure is elevated above target today.  In order to address the patient's elevated BP: Blood pressure will be monitored at home to determine if medication changes need to be made.; A new medication was prescribed today.    Resistant hypertension Assessment: BP is uncontrolled in office BP 173/108 mmHg;  above the goal (<130/80). Similar home readings Tolerates current medications well without any side effects Denies SOB, palpitation, chest pain, headaches,or swelling Reiterated the  importance of regular exercise and low salt diet   Plan:  Start taking spironolactone  25 mg once daily Continue taking amlodipine  10 mg qd, clonidine  0.2 mg patch qSunday hydralazine  100 mg bid, labetalol  300 mg bid, Entresto  97/103 bid Patient to keep record of BP readings with heart rate and report to us  at the next visit Patient to follow up with PharmD in 6 weeks  Labs ordered today:  BMET in 10 days   Allean Mink PharmD CPP [facility] [address] [place], [place] [address] [phone]

## 2023-12-31 ENCOUNTER — Encounter (HOSPITAL_BASED_OUTPATIENT_CLINIC_OR_DEPARTMENT_OTHER): Payer: Self-pay | Admitting: Pharmacist Clinician (PhC)/ Clinical Pharmacy Specialist

## 2023-12-31 ENCOUNTER — Ambulatory Visit (HOSPITAL_BASED_OUTPATIENT_CLINIC_OR_DEPARTMENT_OTHER): Payer: Managed Care, Other (non HMO) | Admitting: Pharmacist Clinician (PhC)/ Clinical Pharmacy Specialist

## 2023-12-31 VITALS — BP 173/108 | HR 80

## 2023-12-31 DIAGNOSIS — I1A0 Resistant hypertension: Secondary | ICD-10-CM | POA: Diagnosis not present

## 2023-12-31 MED ORDER — SPIRONOLACTONE 25 MG PO TABS: 25.0000 mg | ORAL_TABLET | Freq: Every day | ORAL | 3 refills | Status: DC

## 2023-12-31 NOTE — Assessment & Plan Note (Addendum)
 Assessment: BP is uncontrolled in office BP 173/108 mmHg;  above the goal (<130/80). Similar home readings Tolerates current medications well without any side effects Denies SOB, palpitation, chest pain, headaches,or swelling Reiterated the importance of regular exercise and low salt diet   Plan:  Start taking spironolactone  25 mg once daily Continue taking amlodipine  10 mg qd, clonidine  0.2 mg patch qSunday hydralazine  100 mg bid, labetalol  300 mg bid, Entresto  97/103 bid Patient to keep record of BP readings with heart rate and report to us  at the next visit Patient to follow up with PharmD in 6 weeks  Labs ordered today:  BMET in 10 days Is interested in Unit dose medication packaging from [facility], will reach out to pharmacy team to coordinate with patient.

## 2023-12-31 NOTE — Patient Instructions (Addendum)
 Follow up appointment: I will reach out to you for an appointment in 6 wks  Go to the lab in 10 days to check kidney function  Take your BP meds as follows:  I will reach out to the [facility] Geroge Long) to get you set up with the unit dose packaging  Start spironolactone  25 mg once daily   MyChart message to confirm Entresto  dose  Check your blood pressure at home daily (if able) and keep record of the readings.  Your blood pressure goal is < 130/80  To check your pressure at home you will need to:  1. Sit up in a chair, with feet flat on the floor and back supported. Do not cross your ankles or legs. 2. Rest your left arm so that the cuff is about heart level. If the cuff goes on your upper arm,  then just relax the arm on the table, arm of the chair or your lap. If you have a wrist cuff, we  suggest relaxing your wrist against your chest (think of it as Pledging the Flag with the  wrong arm).  3. Place the cuff snugly around your arm, about 1 inch above the crook of your elbow. The  cords should be inside the groove of your elbow.  4. Sit quietly, with the cuff in place, for about 5 minutes. After that 5 minutes press the power  button to start a reading. 5. Do not talk or move while the reading is taking place.  6. Record your readings on a sheet of paper. Although most cuffs have a memory, it is often  easier to see a pattern developing when the numbers are all in front of you.  7. You can repeat the reading after 1-3 minutes if it is recommended  Make sure your bladder is empty and you have not had caffeine or tobacco within the last 30 min  Always bring your blood pressure log with you to your appointments. If you have not brought your monitor in to be double checked for accuracy, please bring it to your next appointment.  You can find a list of quality blood pressure cuffs at [email or web address]  Important lifestyle changes to control high blood pressure  Intervention   Effect on the BP  Lose extra pounds and watch your waistline Weight loss is one of the most effective lifestyle changes for controlling blood pressure. If you're overweight or obese, losing even a small amount of weight can help reduce blood pressure. Blood pressure might go down by about 1 millimeter of mercury (mm Hg) with each kilogram (about 2.2 pounds) of weight lost.  Exercise regularly As a general goal, aim for at least 30 minutes of moderate physical activity every day. Regular physical activity can lower high blood pressure by about 5 to 8 mm Hg.  Eat a healthy diet Eating a diet rich in whole grains, fruits, vegetables, and low-fat dairy products and low in saturated fat and cholesterol. A healthy diet can lower high blood pressure by up to 11 mm Hg.  Reduce salt (sodium) in your diet Even a small reduction of sodium in the diet can improve heart health and reduce high blood pressure by about 5 to 6 mm Hg.  Limit alcohol One drink equals 12 ounces of beer, 5 ounces of wine, or 1.5 ounces of 80-proof liquor.  Limiting alcohol to less than one drink a day for women or two drinks a day for men can help lower blood  pressure by about 4 mm Hg.   If you have any questions or concerns please use My Chart to send questions or call the office at [phone]

## 2024-01-13 ENCOUNTER — Other Ambulatory Visit: Payer: Self-pay | Admitting: Family Medicine

## 2024-01-22 ENCOUNTER — Ambulatory Visit: Payer: Managed Care, Other (non HMO) | Admitting: Family Medicine

## 2024-01-23 ENCOUNTER — Ambulatory Visit (HOSPITAL_COMMUNITY): Payer: Managed Care, Other (non HMO)

## 2024-01-23 ENCOUNTER — Ambulatory Visit: Payer: Managed Care, Other (non HMO) | Admitting: "Endocrinology

## 2024-01-31 ENCOUNTER — Ambulatory Visit (HOSPITAL_COMMUNITY): Payer: Managed Care, Other (non HMO)

## 2024-02-04 ENCOUNTER — Ambulatory Visit (HOSPITAL_COMMUNITY)
Admission: RE | Admit: 2024-02-04 | Discharge: 2024-02-04 | Disposition: A | Discharge status: Home / Self Care | Payer: Managed Care, Other (non HMO) | Source: Ambulatory Visit | Attending: Family Medicine | Admitting: Family Medicine

## 2024-02-04 DIAGNOSIS — Z1231 Encounter for screening mammogram for malignant neoplasm of breast: Secondary | ICD-10-CM | POA: Diagnosis present

## 2024-02-27 ENCOUNTER — Ambulatory Visit: Payer: Managed Care, Other (non HMO) | Admitting: Family Medicine

## 2024-02-27 ENCOUNTER — Encounter: Payer: Self-pay | Admitting: Family Medicine

## 2024-02-27 VITALS — BP 152/84 | HR 85 | Resp 16 | Ht 65.0 in | Wt 235.0 lb

## 2024-02-27 DIAGNOSIS — R7303 Prediabetes: Secondary | ICD-10-CM | POA: Diagnosis not present

## 2024-02-27 DIAGNOSIS — I1A0 Resistant hypertension: Secondary | ICD-10-CM | POA: Diagnosis not present

## 2024-02-27 DIAGNOSIS — J3089 Other allergic rhinitis: Secondary | ICD-10-CM

## 2024-02-27 DIAGNOSIS — G4733 Obstructive sleep apnea (adult) (pediatric): Secondary | ICD-10-CM

## 2024-02-27 DIAGNOSIS — E890 Postprocedural hypothyroidism: Secondary | ICD-10-CM

## 2024-02-27 DIAGNOSIS — N926 Irregular menstruation, unspecified: Secondary | ICD-10-CM

## 2024-02-27 DIAGNOSIS — I10 Essential (primary) hypertension: Secondary | ICD-10-CM

## 2024-02-27 MED ORDER — FLUTICASONE PROPIONATE 50 MCG/ACT NA SUSP: 2.0000 | Freq: Every day | NASAL | 6 refills | Status: AC

## 2024-02-27 MED ORDER — FLUTICASONE PROPIONATE 50 MCG/ACT NA SUSP: 2.0000 | Freq: Every day | NASAL | 6 refills | Status: DC

## 2024-02-27 NOTE — Assessment & Plan Note (Signed)
 DASH diet and commitment to daily physical activity for a minimum of 30 minutes discussed and encouraged, as a part of hypertension management. The importance of attaining a healthy weight is also discussed.     02/27/2024    4:32 PM 12/31/2023    2:25 PM 12/31/2023    2:18 PM 12/13/2023    8:09 AM 12/13/2023    8:07 AM 09/26/2023   10:44 PM 09/26/2023   10:40 PM  BP/Weight  Systolic BP 152 173 171 173 223 179 179  Diastolic BP 84 108 131 121 120 125 125  Wt. (Lbs) 235    238.12    BMI 39.11 kg/m2    39.63 kg/m2

## 2024-02-27 NOTE — Assessment & Plan Note (Signed)
 Patient educated about the importance of limiting  Carbohydrate intake , the need to commit to daily physical activity for a minimum of 30 minutes , and to commit weight loss. The fact that changes in all these areas will reduce or eliminate all together the development of diabetes is stressed.      Latest Ref Rng & Units 12/13/2023    8:46 AM 07/04/2023    8:28 AM 06/20/2023    8:41 AM 04/10/2023    8:42 AM 01/03/2023    8:45 AM  Diabetic Labs  HbA1c 4.8 - 5.6 % 6.4  6.1    6.0   Chol 100 - 199 mg/dL 098    119    HDL >14 mg/dL 54    43    Calc LDL 0 - 99 mg/dL 89    51    Triglycerides 0 - 149 mg/dL 782    88    Creatinine 0.57 - 1.00 mg/dL 9.56   2.13  0.86  5.78       02/27/2024    4:32 PM 12/31/2023    2:25 PM 12/31/2023    2:18 PM 12/13/2023    8:09 AM 12/13/2023    8:07 AM 09/26/2023   10:44 PM 09/26/2023   10:40 PM  BP/Weight  Systolic BP 152 173 171 173 223 179 179  Diastolic BP 84 108 131 121 120 125 125  Wt. (Lbs) 235    238.12    BMI 39.11 kg/m2    39.63 kg/m2        Latest Ref Rng & Units 08/02/2023    8:25 AM 11/07/2022   12:00 AM  Foot/eye exam completion dates  Eye Exam No Retinopathy No Retinopathy     No Retinopathy         This result is from an external source.

## 2024-02-27 NOTE — Patient Instructions (Addendum)
 Annual exam in 4 months, call if you need me sooner  Need to   take spironolactone and make f/u appointment with [facility]   You are referred to Gynecology re  menses   Non fasting hBA1c, chem7 and EGFR this week  Take allergy meds every day as scheduled  It is important that you exercise regularly at least 30 minutes 5 times a week. If you develop chest pain, have severe difficulty breathing, or feel very tired, stop exercising immediately and seek medical attention   Thanks for choosing [facility], we consider it a privelige to serve you.

## 2024-02-27 NOTE — Addendum Note (Signed)
 Addended by: Kerri Perches on: 02/27/2024 05:10 PM   Modules accepted: Orders

## 2024-02-28 ENCOUNTER — Telehealth: Payer: Self-pay | Admitting: Family Medicine

## 2024-02-28 NOTE — Telephone Encounter (Signed)
 Copied from CRM [id number]. Topic: Appointments - Scheduling Inquiry for Clinic >> Feb 28, 2024  3:23 PM Higinio Roger wrote: Reason for CRM: Patient needs appointment with Chilton Si, MD to manage blood pressure and Radene Knee.  Patient callback #: [phone]

## 2024-02-28 NOTE — Telephone Encounter (Signed)
 Neither provider is in our office- we do not schedule outside of our office, she will need to contact their office.

## 2024-03-02 ENCOUNTER — Encounter: Payer: Self-pay | Admitting: Family Medicine

## 2024-03-02 DIAGNOSIS — N926 Irregular menstruation, unspecified: Secondary | ICD-10-CM | POA: Insufficient documentation

## 2024-03-02 NOTE — Assessment & Plan Note (Signed)
 Uncontrolled, non compliant with med management, has not been taking spironolactone, advised to start thjis and keep appt with [facility] DASH diet and commitment to daily physical activity for a minimum of 30 minutes discussed and encouraged, as a part of hypertension management. The importance of attaining a healthy weight is also discussed.     02/27/2024    4:32 PM 12/31/2023    2:25 PM 12/31/2023    2:18 PM 12/13/2023    8:09 AM 12/13/2023    8:07 AM 09/26/2023   10:44 PM 09/26/2023   10:40 PM  BP/Weight  Systolic BP 152 173 171 173 223 179 179  Diastolic BP 84 108 131 121 120 125 125  Wt. (Lbs) 235    238.12    BMI 39.11 kg/m2    39.63 kg/m2

## 2024-03-02 NOTE — Progress Notes (Signed)
 Tracy Joseph     MRN: [id number]      DOB: 1971-08-20  Chief Complaint  Patient presents with   Hypertension    Follow up visit    Allergies    Itchy eyes, sneezing, wants a good allergy pill she can take that will last all day. Already using the nasal spray    HPI Tracy Joseph is here for follow up and re-evaluation of chronic medical conditions, medication management and review of any available recent lab and radiology data.  Preventive health is updated, specifically  Cancer screening and Immunization.   Questions or concerns regarding consultations or procedures which the PT has had in the interim are  addressed. The PT denies any adverse reactions to current medications since the last visit.   ROS Denies recent fever or chills. C/o increased nasal congestion, clear drainage and sinus pressure, denies ear pain or sore throat. Denies chest congestion, productive cough or wheezing. Denies chest pains, palpitations and leg swelling Denies abdominal pain, nausea, vomiting,diarrhea or constipation.   Denies dysuria, frequency, hesitancy or incontinence. Denies joint pain, swelling and limitation in mobility. Denies headaches, seizures, numbness, or tingling. Denies depression, anxiety or insomnia. Denies skin break down or rash.   PE  BP (!) 152/84   Pulse 85   Resp 16   Ht 5\' 5"  (1.651 m)   Wt 235 lb (106.6 kg)   SpO2 93%   BMI 39.11 kg/m   Patient alert and oriented and in no cardiopulmonary distress.  HEENT: No facial asymmetry, EOMI,     Neck supple .Sinus tenderness and nasal congestion  Chest: Clear to auscultation bilaterally.  CVS: S1, S2 no murmurs, no S3.Regular rate.  ABD: Soft non tender.   Ext: No edema  MS: Adequate ROM spine, shoulders, hips and knees.  Skin: Intact, no ulcerations or rash noted.  Psych: Good eye contact, normal affect. Memory intact not anxious or depressed appearing.  CNS: CN 2-12 intact, power,  normal throughout.no  focal deficits noted.   Assessment & Plan  Resistant hypertension DASH diet and commitment to daily physical activity for a minimum of 30 minutes discussed and encouraged, as a part of hypertension management. The importance of attaining a healthy weight is also discussed.     02/27/2024    4:32 PM 12/31/2023    2:25 PM 12/31/2023    2:18 PM 12/13/2023    8:09 AM 12/13/2023    8:07 AM 09/26/2023   10:44 PM 09/26/2023   10:40 PM  BP/Weight  Systolic BP 152 173 171 173 223 179 179  Diastolic BP 84 108 131 121 120 125 125  Wt. (Lbs) 235    238.12    BMI 39.11 kg/m2    39.63 kg/m2         Prediabetes Patient educated about the importance of limiting  Carbohydrate intake , the need to commit to daily physical activity for a minimum of 30 minutes , and to commit weight loss. The fact that changes in all these areas will reduce or eliminate all together the development of diabetes is stressed.      Latest Ref Rng & Units 12/13/2023    8:46 AM 07/04/2023    8:28 AM 06/20/2023    8:41 AM 04/10/2023    8:42 AM 01/03/2023    8:45 AM  Diabetic Labs  HbA1c 4.8 - 5.6 % 6.4  6.1    6.0   Chol 100 - 199 mg/dL 235    573  HDL >39 mg/dL 54    43    Calc LDL 0 - 99 mg/dL 89    51    Triglycerides 0 - 149 mg/dL 914    88    Creatinine 0.57 - 1.00 mg/dL 7.82   9.56  2.13  0.86       02/27/2024    4:32 PM 12/31/2023    2:25 PM 12/31/2023    2:18 PM 12/13/2023    8:09 AM 12/13/2023    8:07 AM 09/26/2023   10:44 PM 09/26/2023   10:40 PM  BP/Weight  Systolic BP 152 173 171 173 223 179 179  Diastolic BP 84 108 131 121 120 125 125  Wt. (Lbs) 235    238.12    BMI 39.11 kg/m2    39.63 kg/m2        Latest Ref Rng & Units 08/02/2023    8:25 AM 11/07/2022   12:00 AM  Foot/eye exam completion dates  Eye Exam No Retinopathy No Retinopathy     No Retinopathy         This result is from an external source.      Malignant hypertension Uncontrolled, non compliant with med management, has not  been taking spironolactone, advised to start thjis and keep appt with [facility] DASH diet and commitment to daily physical activity for a minimum of 30 minutes discussed and encouraged, as a part of hypertension management. The importance of attaining a healthy weight is also discussed.     02/27/2024    4:32 PM 12/31/2023    2:25 PM 12/31/2023    2:18 PM 12/13/2023    8:09 AM 12/13/2023    8:07 AM 09/26/2023   10:44 PM 09/26/2023   10:40 PM  BP/Weight  Systolic BP 152 173 171 173 223 179 179  Diastolic BP 84 108 131 121 120 125 125  Wt. (Lbs) 235    238.12    BMI 39.11 kg/m2    39.63 kg/m2         Hypothyroidism following radioiodine therapy Managed by Endo  Environmental and seasonal allergies Uncontrolled symptoms, add montelukast, continue nasal spray  Sleep apnea Still needs to  obtain machine, importance of same discussed  Abnormal menses Refer [facility] for re eval and follow up  Morbid obesity (HCC)  Patient re-educated about  the importance of commitment to a  minimum of 150 minutes of exercise per week as able.  The importance of healthy food choices with portion control discussed, as well as eating regularly and within a 12 hour window most days. The need to choose "clean , green" food 50 to 75% of the time is discussed, as well as to make water the primary drink and set a goal of 64 ounces water daily.       02/27/2024    4:32 PM 12/13/2023    8:07 AM 09/26/2023    9:20 PM  Weight /BMI  Weight 235 lb 238 lb 1.9 oz 236 lb  Height 5\' 5"  (1.651 m) 5\' 5"  (1.651 m)   BMI 39.11 kg/m2 39.63 kg/m2 39.27 kg/m2    Unchnahed, considering surgical intervention

## 2024-03-02 NOTE — Assessment & Plan Note (Signed)
 Refer [facility] for re eval and follow up

## 2024-03-02 NOTE — Assessment & Plan Note (Signed)
  Patient re-educated about  the importance of commitment to a  minimum of 150 minutes of exercise per week as able.  The importance of healthy food choices with portion control discussed, as well as eating regularly and within a 12 hour window most days. The need to choose "clean , green" food 50 to 75% of the time is discussed, as well as to make water the primary drink and set a goal of 64 ounces water daily.       02/27/2024    4:32 PM 12/13/2023    8:07 AM 09/26/2023    9:20 PM  Weight /BMI  Weight 235 lb 238 lb 1.9 oz 236 lb  Height 5\' 5"  (1.651 m) 5\' 5"  (1.651 m)   BMI 39.11 kg/m2 39.63 kg/m2 39.27 kg/m2    Unchnahed, considering surgical intervention

## 2024-03-02 NOTE — Assessment & Plan Note (Signed)
 Still needs to  obtain machine, importance of same discussed

## 2024-03-02 NOTE — Assessment & Plan Note (Signed)
Managed by Endo 

## 2024-03-02 NOTE — Assessment & Plan Note (Signed)
 Uncontrolled symptoms, add montelukast, continue nasal spray

## 2024-03-09 ENCOUNTER — Other Ambulatory Visit: Payer: Self-pay | Admitting: Family Medicine

## 2024-03-18 ENCOUNTER — Ambulatory Visit: Payer: Managed Care, Other (non HMO) | Admitting: "Endocrinology

## 2024-03-20 ENCOUNTER — Encounter: Payer: Self-pay | Admitting: Obstetrics & Gynecology

## 2024-03-20 ENCOUNTER — Ambulatory Visit: Admitting: Obstetrics & Gynecology

## 2024-03-20 VITALS — BP 172/101 | HR 85 | Ht 65.0 in | Wt 233.0 lb

## 2024-03-20 DIAGNOSIS — I1A0 Resistant hypertension: Secondary | ICD-10-CM | POA: Diagnosis not present

## 2024-03-20 DIAGNOSIS — E669 Obesity, unspecified: Secondary | ICD-10-CM | POA: Diagnosis not present

## 2024-03-20 DIAGNOSIS — N920 Excessive and frequent menstruation with regular cycle: Secondary | ICD-10-CM

## 2024-03-20 DIAGNOSIS — N951 Menopausal and female climacteric states: Secondary | ICD-10-CM | POA: Diagnosis not present

## 2024-03-20 MED ORDER — NORETHINDRONE ACETATE 5 MG PO TABS
5.0000 mg | ORAL_TABLET | Freq: Every day | ORAL | 4 refills | Status: AC
Start: 2024-03-20 — End: 2024-10-02

## 2024-03-20 NOTE — Progress Notes (Signed)
 GYN VISIT Patient name: Tracy Joseph MRN [id number]  Date of birth: [birth year] Chief Complaint:   Follow-up (Period stopped for 10 months now is back.)  History of Present Illness:   Tracy Joseph is a 53 y.o. [phone] female being seen today for the following concerns:  Heavy menstrual bleeding: h/o AUB- previously being treated with Aygestin.  Seems as though she has been off this medication, but not sure when. She notes that the period stopped for about 10 mos (not sure if she was taking Norethinedrone or not- likely maybe not).  Now the period have returned and are moderate to heavy.  Lasting about 3-4 days typically using 3 pads per day.  Bleeding has changed to a "darker" appearance than before.  Also noted passage of dime-sized clots.    Denies intermenstrual bleeding.  Menses typically monthly since at least November  Some hot flashes and night sweats.  Notes vaginal dryness, denies dyspareunia.  Not interested in herbal supplements didn't want to add to her list of medication.  Rates her symptoms 7/10  Review of Systems:   Pertinent items are noted in HPI Denies fever/chills, dizziness, headaches, visual disturbances, fatigue, shortness of breath, chest pain, abdominal pain, vomiting. Pertinent History Reviewed:   Past Surgical History:  Procedure Laterality Date   BIOPSY  07/25/2022   Procedure: BIOPSY;  Surgeon: Dolores Frame, MD;  [facility]: [facility];  Service: Gastroenterology;;   COLONOSCOPY N/A 05/03/2017   Procedure: COLONOSCOPY;  Surgeon: Malissa Hippo, MD;  Location: [facility];  Service: Endoscopy;  Laterality: N/A;  930   COLONOSCOPY WITH PROPOFOL N/A 07/25/2022   Procedure: COLONOSCOPY WITH PROPOFOL;  Surgeon: Dolores Frame, MD;  Location: [facility];  Service: Gastroenterology;  Laterality: N/A;  1130 am   ESOPHAGOGASTRODUODENOSCOPY (EGD) WITH PROPOFOL N/A 07/25/2022   Procedure: ESOPHAGOGASTRODUODENOSCOPY (EGD) WITH PROPOFOL;   Surgeon: Dolores Frame, MD;  Location: [facility];  Service: Gastroenterology;  Laterality: N/A;   TUBAL LIGATION  2005    Past Medical History:  Diagnosis Date   ACE-inhibitor cough    Chronic combined systolic and diastolic heart failure (HCC) 05/04/2022   New dx in 04/2022 during hospitalization. Severe LVH, grade2 diastolic dysfunction, EF 45 to 50 %   Chronic GERD    Essential hypertension    Goiter    Initially hyperthyroid treated with RAI, now hypothyroid - follows with Dr. Everardo All   Helicobacter pylori ab+    Hypertension    Phreesia 10/12/2020   Hypothyroidism    Iron deficiency anemia    Mixed hyperlipidemia    Obstructive sleep apnea    Suspected    Prediabetes    Seasonal allergies    Tubular adenoma of colon    Reviewed problem list, medications and allergies. Physical Assessment:   Vitals:   03/20/24 0905 03/20/24 0927  BP: (!) 177/102 (!) 172/101  Pulse: 85   Weight: 233 lb (105.7 kg)   Height: 5\' 5"  (1.651 m)   Body mass index is 38.77 kg/m.       Physical Examination:   General appearance: alert, well appearing, and in no distress  Psych: mood appropriate, normal affect  Skin: warm & dry   Cardiovascular: normal heart rate noted  Respiratory: normal respiratory effort, no distress  Abdomen: obese, soft, non-tender, no rebound, no guarding, uterus well below umbilicus  Pelvic: VULVA: normal appearing vulva with no masses, tenderness or lesions, VAGINA: normal appearing vagina with normal color and discharge,  no lesions, CERVIX: normal appearing cervix without discharge or lesions, UTERUS: uterus is normal size, shape, consistency and nontender, ADNEXA: normal adnexa in size, nontender and no masses- exam limited due to body habitus  Extremities: no edema   Chaperone: Faith Rogue    Assessment & Plan:  1) HMB, perimenopausal -plan to restart Aygestin -f/u in 6mos to re-evaluate.  If minimal improvement will plan for EMB at next  visit -should pt not see significant change advised to call office to schedule earlier follow up, anticipate change within 2-3 mos -will also plan for [place] next visit to r/o uterine fibroids -reviewed perimenopausal symptoms, pt notes that symptoms are currently tolerable  -due to HTN pt not a candidate for combined therapy   Orders Placed This Encounter  Procedures   US PELVIC COMPLETE WITH TRANSVAGINAL   Meds ordered this encounter  Medications   norethindrone (AYGESTIN) 5 MG tablet    Sig: Take 1 tablet (5 mg total) by mouth daily.    Dispense:  90 tablet    Refill:  4     Return in about 6 months (around 09/19/2024) for Medication follow up, with Dr. Charlotta Newton and pelvic US.   Myna Hidalgo, DO Attending Obstetrician & Gynecologist, [facility] for [employer], [facility]

## 2024-03-21 ENCOUNTER — Encounter: Payer: Self-pay | Admitting: Family Medicine

## 2024-03-21 LAB — BMP8+EGFR
BUN/Creatinine Ratio: 13 (ref 9–23)
BUN: 18 mg/dL (ref 6–24)
CO2: 23 mmol/L (ref 20–29)
Calcium: 10 mg/dL (ref 8.7–10.2)
Chloride: 101 mmol/L (ref 96–106)
Creatinine, Ser: 1.4 mg/dL — ABNORMAL HIGH (ref 0.57–1.00)
Glucose: 123 mg/dL — ABNORMAL HIGH (ref 70–99)
Potassium: 4.2 mmol/L (ref 3.5–5.2)
Sodium: 139 mmol/L (ref 134–144)
eGFR: 45 mL/min/{1.73_m2} — ABNORMAL LOW (ref >59)

## 2024-03-21 LAB — HEMOGLOBIN A1C
Est. average glucose Bld gHb Est-mCnc: 143 mg/dL
Hgb A1c MFr Bld: 6.6 % — ABNORMAL HIGH (ref 4.8–5.6)

## 2024-03-24 ENCOUNTER — Other Ambulatory Visit: Payer: Self-pay | Admitting: Family Medicine

## 2024-03-31 ENCOUNTER — Ambulatory Visit (HOSPITAL_BASED_OUTPATIENT_CLINIC_OR_DEPARTMENT_OTHER): Admitting: Pharmacist Clinician (PhC)/ Clinical Pharmacy Specialist

## 2024-03-31 ENCOUNTER — Encounter (HOSPITAL_BASED_OUTPATIENT_CLINIC_OR_DEPARTMENT_OTHER): Payer: Self-pay | Admitting: Pharmacist Clinician (PhC)/ Clinical Pharmacy Specialist

## 2024-03-31 DIAGNOSIS — I1A0 Resistant hypertension: Secondary | ICD-10-CM

## 2024-03-31 NOTE — Patient Instructions (Addendum)
 Follow up appointment: with Dr. Duke Salvia on Tuesday July 8 at 3:30  Take your BP meds as follows:   amlodipine 10 mg qd, clonidine 0.2 mg patch qSunday hydralazine 100 mg bid, labetalol 300 mg bid, Entresto 97/103 bid,  Check your blood pressure at home daily (if able) and keep record of the readings.  Your blood pressure goal is < 130/80  To check your pressure at home you will need to:  1. Sit up in a chair, with feet flat on the floor and back supported. Do not cross your ankles or legs. 2. Rest your left arm so that the cuff is about heart level. If the cuff goes on your upper arm,  then just relax the arm on the table, arm of the chair or your lap. If you have a wrist cuff, we  suggest relaxing your wrist against your chest (think of it as Pledging the Flag with the  wrong arm).  3. Place the cuff snugly around your arm, about 1 inch above the crook of your elbow. The  cords should be inside the groove of your elbow.  4. Sit quietly, with the cuff in place, for about 5 minutes. After that 5 minutes press the power  button to start a reading. 5. Do not talk or move while the reading is taking place.  6. Record your readings on a sheet of paper. Although most cuffs have a memory, it is often  easier to see a pattern developing when the numbers are all in front of you.  7. You can repeat the reading after 1-3 minutes if it is recommended  Make sure your bladder is empty and you have not had caffeine or tobacco within the last 30 min  Always bring your blood pressure log with you to your appointments. If you have not brought your monitor in to be double checked for accuracy, please bring it to your next appointment.  You can find a list of quality blood pressure cuffs at [email or web address]  Important lifestyle changes to control high blood pressure  Intervention  Effect on the BP  Lose extra pounds and watch your waistline Weight loss is one of the most effective lifestyle changes for  controlling blood pressure. If you're overweight or obese, losing even a small amount of weight can help reduce blood pressure. Blood pressure might go down by about 1 millimeter of mercury (mm Hg) with each kilogram (about 2.2 pounds) of weight lost.  Exercise regularly As a general goal, aim for at least 30 minutes of moderate physical activity every day. Regular physical activity can lower high blood pressure by about 5 to 8 mm Hg.  Eat a healthy diet Eating a diet rich in whole grains, fruits, vegetables, and low-fat dairy products and low in saturated fat and cholesterol. A healthy diet can lower high blood pressure by up to 11 mm Hg.  Reduce salt (sodium) in your diet Even a small reduction of sodium in the diet can improve heart health and reduce high blood pressure by about 5 to 6 mm Hg.  Limit alcohol One drink equals 12 ounces of beer, 5 ounces of wine, or 1.5 ounces of 80-proof liquor.  Limiting alcohol to less than one drink a day for women or two drinks a day for men can help lower blood pressure by about 4 mm Hg.   If you have any questions or concerns please use My Chart to send questions or call the office at [phone]

## 2024-03-31 NOTE — Assessment & Plan Note (Addendum)
 Assessment: BP is controlled in office BP 130/83 mmHg;  above the goal (<130/80). Never started spironolactone, didn't want to start another medication.  Tolerates amlodipine 10 mg qd, clonidine 0.2 mg patch qSunday hydralazine 100 mg bid, labetalol 300 mg bid, Entresto 97/103 bid, well without any side effects Denies SOB, palpitation, chest pain, headaches,or swelling Discussed the benefits of spironolactone in hypertension and CHF.  Explained that she will most likely need to start this before long Reiterated the importance of regular exercise and low salt diet   Plan:  Continue taking amlodipine 10 mg qd, clonidine 0.2 mg patch qSunday hydralazine 100 mg bid, labetalol 300 mg bid, Entresto 97/103 bid, Patient to keep record of BP readings with heart rate and report to us  at the next visit Patient to follow up with Dr. Theodis Fiscal in 3 months  Work on lifestyle modifications to help lower BP, aware that will probably need to add in spironolactone.   Labs ordered today:  none

## 2024-03-31 NOTE — Progress Notes (Signed)
 03/31/2024 Nelma Rothman Carmin Muskrat 07-14-1971 [id number]   HPI:  Tracy Joseph is a 53 y.o. female patient of Dr Duke Salvia, with a PMH below who presents today for advanced hypertension clinic follow up.  She was seen  by Dr. Izora Ribas just 2 years ago as a consult for malignant hypertension, and at that first visit had a pressure of 205/135, dropping to 200/110 in the office.  Because of worsening kidney function, he started her on labetalol 100 mg bid.  At follow up with Laural Golden pressure was still at 200/130.  She was lost to follow up until last month, when she was referred from her PCP to the [facility].  She has been seen multiple times in the past 2 years, secondary workup negative for hyperaldosteronism.  Also reports at each visit that has been out of at least 1 medication for several days.  At her most recent visit with me in January her pressure was still significantly elevated at 173/108, so spironolactone 25 mg daily was added.   Today she returns for follow up.  Labs drawn earlier this month show renal function and potassium stable.  Her A1c was elevated, back to 6.6.   She admits that after her last visit she really did not want to add another medication, and has not yet started spironolactone.  Only checked home readings a few times, noting nothing seemed to change much.   Past Medical History: hyperlipidemia 3/23 LDL 140, currently not on medication  CKD Stage 3b  CHF HFmrEF at 45-50%, on labetalol, Entresto; grade 2 diastolic dysfunction  DM2 3/23 A1c 6.1; was as high as 7.5 in 2020  OSA Study positive, has upcoming appointment  hypothyroid 10/23 TSH 0.035, T4 2.15; levothyroxine decreased to 150 mcg    Blood Pressure Goal:  130/80  Current Medications:   amlodipine 10 mg qd, clonidine 0.2 mg patch qSunday hydralazine 100 mg bid, labetalol 300 mg bid, Entresto 97/103 bid,   Family Hx:  strong family history of hypertension; all of her cousins are on 5+  meds, mother and aunts good with 1-2 meds; maternal GM died stroke; 2 aunts with CABG, father family more cancers; has 1 daughter, no diagnosed hypertension  Social Hx: no tobacco, only occasional alcohol; no soda in 2 weeks, does drink coffee when cold outside [place])      Diet:  Started Reuel Boom fast on Jan 1 for 21 days - vegetables, no meats or snack foods; eating fish and eggs for protein, lots of vegetables; felt better with less red meats, increasing chicken  Exercise: not much recently, usually goes to gym with mother, but mother has been unable to go for past few weeks; line dance class on Mondays, exercise twice weekly  Home BP readings:  154/82, 152/84  4 home readings with her today, average 169/89 (Last month 7 readings average 159/95)    Intolerances:  Ace inhibitors - cough Amlodipine - LE edema Benicar - cough, dry, pruritis  Labs: 8/23:  Na 137, K 3.6, Glu 84, BUN 16, SCr 1.26, GFR 51   Wt Readings from Last 3 Encounters:  03/20/24 233 lb (105.7 kg)  02/27/24 235 lb (106.6 kg)  12/13/23 238 lb 1.9 oz (108 kg)   BP Readings from Last 3 Encounters:  03/31/24 130/83  03/20/24 (!) 172/101  02/27/24 (!) 152/84   Pulse Readings from Last 3 Encounters:  03/31/24 84  03/20/24 85  02/27/24 85    Current Outpatient Medications  Medication Sig Dispense Refill   amLODipine (NORVASC) 10 MG tablet Take 1 tablet (10 mg total) by mouth daily. 90 tablet 3   Azelastine HCl 137 MCG/SPRAY SOLN PLACE 2 SPRAYS INTO BOTH NOSTRILS 2 (TWO) TIMES DAILY. USE IN EACH NOSTRIL AS DIRECTED 30 mL 1   cloNIDine (CATAPRES - DOSED IN MG/24 HR) 0.3 mg/24hr patch PLACE 1 PATCH (0.3 MG TOTAL) ONTO THE SKIN ONCE A WEEK. 12 patch 4   ferrous sulfate 325 (65 FE) MG EC tablet Take 1 tablet (325 mg total) by mouth daily with breakfast. 90 tablet 3   fluticasone (FLONASE) 50 MCG/ACT nasal spray Place 2 sprays into both nostrils daily. 16 g 6   fluticasone (FLONASE) 50 MCG/ACT nasal spray Place 2  sprays into both nostrils daily. 16 g 6   furosemide (LASIX) 40 MG tablet Take 1 tablet (40 mg total) by mouth daily. 90 tablet 3   hydrALAZINE (APRESOLINE) 100 MG tablet TAKE 1 TABLET BY MOUTH TWICE A DAY 180 tablet 2   isosorbide mononitrate (IMDUR) 60 MG 24 hr tablet Take 1 tablet (60 mg total) by mouth daily. 90 tablet 3   labetalol (NORMODYNE) 300 MG tablet Take 1 tablet (300 mg total) by mouth 2 (two) times daily. For BP 180 tablet 3   levothyroxine (SYNTHROID) 200 MCG tablet Take 1 tablet (200 mcg total) by mouth daily before breakfast. 90 tablet 1   montelukast (SINGULAIR) 10 MG tablet TAKE 1 TABLET BY MOUTH EVERYDAY AT BEDTIME 90 tablet 1   norethindrone (AYGESTIN) 5 MG tablet Take 1 tablet (5 mg total) by mouth daily. 90 tablet 4   pantoprazole (PROTONIX) 40 MG tablet TAKE 1 TABLET (40 MG TOTAL) BY MOUTH TWICE A DAY BEFORE MEALS 180 tablet 1   rosuvastatin (CRESTOR) 10 MG tablet TAKE 1 TABLET BY MOUTH EVERY DAY 90 tablet 1   sacubitril-valsartan (ENTRESTO) 97-103 MG Take 1 tablet by mouth 2 (two) times daily. 180 tablet 3   spironolactone (ALDACTONE) 25 MG tablet Take 1 tablet (25 mg total) by mouth daily. 30 tablet 3   vitamin B-12 (CYANOCOBALAMIN) 1000 MCG tablet Take 1 tablet (1,000 mcg total) by mouth daily. 30 tablet 3   Vitamin D, Ergocalciferol, (DRISDOL) 1.25 MG (50000 UNIT) CAPS capsule TAKE 1 CAPSULE (50,000 UNITS TOTAL) BY MOUTH ONCE A WEEK. ONE CAPSULE ONCE WEEKLY 12 capsule 2   No current facility-administered medications for this visit.    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 Severe LVH, grade2 diastolic dysfunction, EF 45 to 50 %   Chronic GERD    Essential hypertension    Goiter    Initially  hyperthyroid treated with RAI, now hypothyroid - follows with Dr. Everardo All   Helicobacter pylori ab+    Hypertension    Phreesia 10/12/2020   Hypothyroidism    Iron deficiency anemia    Mixed hyperlipidemia    Obstructive sleep apnea    Suspected    Prediabetes    Seasonal allergies    Tubular adenoma of colon     Blood pressure 130/83, pulse 84.    Resistant hypertension Assessment: BP is controlled in office BP 130/83 mmHg;  above the goal (<130/80). Never started spironolactone,  didn't want to start another medication.  Tolerates amlodipine 10 mg qd, clonidine 0.2 mg patch qSunday hydralazine 100 mg bid, labetalol 300 mg bid, Entresto 97/103 bid, well without any side effects Denies SOB, palpitation, chest pain, headaches,or swelling Discussed the benefits of spironolactone in hypertension and CHF.  Explained that she will most likely need to start this before long Reiterated the importance of regular exercise and low salt diet   Plan:  Continue taking amlodipine 10 mg qd, clonidine 0.2 mg patch qSunday hydralazine 100 mg bid, labetalol 300 mg bid, Entresto 97/103 bid, Patient to keep record of BP readings with heart rate and report to us  at the next visit Patient to follow up with Dr. Theodis Fiscal in 3 months  Work on lifestyle modifications to help lower BP, aware that will probably need to add in spironolactone.   Labs ordered today:  none   Donivan Furry PharmD CPP [facility] [address] [place], [place] [address] [phone]

## 2024-04-20 ENCOUNTER — Other Ambulatory Visit: Payer: Self-pay | Admitting: Family Medicine

## 2024-05-15 ENCOUNTER — Other Ambulatory Visit: Payer: Self-pay

## 2024-05-15 DIAGNOSIS — R7303 Prediabetes: Secondary | ICD-10-CM

## 2024-05-15 DIAGNOSIS — E782 Mixed hyperlipidemia: Secondary | ICD-10-CM

## 2024-05-15 DIAGNOSIS — E890 Postprocedural hypothyroidism: Secondary | ICD-10-CM

## 2024-05-19 ENCOUNTER — Ambulatory Visit: Admitting: "Endocrinology

## 2024-05-20 ENCOUNTER — Ambulatory Visit: Admitting: "Endocrinology

## 2024-05-24 ENCOUNTER — Other Ambulatory Visit (HOSPITAL_BASED_OUTPATIENT_CLINIC_OR_DEPARTMENT_OTHER): Payer: Self-pay | Admitting: Cardiovascular Disease

## 2024-06-12 ENCOUNTER — Ambulatory Visit: Admitting: "Endocrinology

## 2024-06-15 ENCOUNTER — Other Ambulatory Visit: Payer: Self-pay | Admitting: Family Medicine

## 2024-06-15 ENCOUNTER — Other Ambulatory Visit (HOSPITAL_BASED_OUTPATIENT_CLINIC_OR_DEPARTMENT_OTHER): Payer: Self-pay | Admitting: Family

## 2024-06-15 ENCOUNTER — Other Ambulatory Visit (HOSPITAL_BASED_OUTPATIENT_CLINIC_OR_DEPARTMENT_OTHER): Payer: Self-pay | Admitting: Cardiovascular Disease

## 2024-06-15 DIAGNOSIS — I5042 Chronic combined systolic (congestive) and diastolic (congestive) heart failure: Secondary | ICD-10-CM

## 2024-06-15 DIAGNOSIS — I1A0 Resistant hypertension: Secondary | ICD-10-CM

## 2024-06-23 ENCOUNTER — Other Ambulatory Visit (HOSPITAL_COMMUNITY)
Admission: RE | Admit: 2024-06-23 | Discharge: 2024-06-23 | Disposition: A | Discharge status: Home / Self Care | Source: Ambulatory Visit | Attending: "Endocrinology | Admitting: "Endocrinology

## 2024-06-23 ENCOUNTER — Other Ambulatory Visit (HOSPITAL_COMMUNITY)
Admission: RE | Admit: 2024-06-23 | Discharge: 2024-06-23 | Disposition: A | Discharge status: Home / Self Care | Source: Ambulatory Visit | Attending: Cardiovascular Disease | Admitting: Cardiovascular Disease

## 2024-06-23 ENCOUNTER — Inpatient Hospital Stay: Payer: Managed Care, Other (non HMO) | Attending: Family Medicine

## 2024-06-23 DIAGNOSIS — E782 Mixed hyperlipidemia: Secondary | ICD-10-CM | POA: Diagnosis not present

## 2024-06-23 DIAGNOSIS — E890 Postprocedural hypothyroidism: Secondary | ICD-10-CM | POA: Insufficient documentation

## 2024-06-23 DIAGNOSIS — R7303 Prediabetes: Secondary | ICD-10-CM | POA: Insufficient documentation

## 2024-06-23 LAB — LIPID PANEL
Cholesterol: 186 mg/dL (ref 0–200)
HDL: 44 mg/dL (ref >40)
LDL Cholesterol: 117 mg/dL — ABNORMAL HIGH (ref 0–99)
Total CHOL/HDL Ratio: 4.2 ratio
Triglycerides: 124 mg/dL (ref <150)
VLDL: 25 mg/dL (ref 0–40)

## 2024-06-23 LAB — COMPREHENSIVE METABOLIC PANEL WITH GFR
ALT: 10 U/L (ref 0–44)
AST: 15 U/L (ref 15–41)
Albumin: 3.8 g/dL (ref 3.5–5.0)
Alkaline Phosphatase: 74 U/L (ref 38–126)
Anion gap: 11 (ref 5–15)
BUN: 18 mg/dL (ref 6–20)
CO2: 23 mmol/L (ref 22–32)
Calcium: 8.9 mg/dL (ref 8.9–10.3)
Chloride: 102 mmol/L (ref 98–111)
Creatinine, Ser: 1.43 mg/dL — ABNORMAL HIGH (ref 0.44–1.00)
GFR, Estimated: 44 mL/min — ABNORMAL LOW (ref >60)
Glucose, Bld: 119 mg/dL — ABNORMAL HIGH (ref 70–99)
Potassium: 3.7 mmol/L (ref 3.5–5.1)
Sodium: 136 mmol/L (ref 135–145)
Total Bilirubin: 0.8 mg/dL (ref 0.0–1.2)
Total Protein: 7.7 g/dL (ref 6.5–8.1)

## 2024-06-23 LAB — BASIC METABOLIC PANEL WITH GFR
Anion gap: 11 (ref 5–15)
BUN: 18 mg/dL (ref 6–20)
CO2: 23 mmol/L (ref 22–32)
Calcium: 9 mg/dL (ref 8.9–10.3)
Chloride: 101 mmol/L (ref 98–111)
Creatinine, Ser: 1.46 mg/dL — ABNORMAL HIGH (ref 0.44–1.00)
GFR, Estimated: 43 mL/min — ABNORMAL LOW (ref >60)
Glucose, Bld: 117 mg/dL — ABNORMAL HIGH (ref 70–99)
Potassium: 3.7 mmol/L (ref 3.5–5.1)
Sodium: 135 mmol/L (ref 135–145)

## 2024-06-23 LAB — T4, FREE: Free T4: 0.65 ng/dL (ref 0.61–1.12)

## 2024-06-23 LAB — TSH: TSH: 14.757 u[IU]/mL — ABNORMAL HIGH (ref 0.350–4.500)

## 2024-06-24 ENCOUNTER — Ambulatory Visit (HOSPITAL_BASED_OUTPATIENT_CLINIC_OR_DEPARTMENT_OTHER): Admitting: Cardiovascular Disease

## 2024-06-24 ENCOUNTER — Encounter (HOSPITAL_COMMUNITY): Payer: Self-pay | Admitting: Hematology

## 2024-06-24 ENCOUNTER — Encounter (HOSPITAL_BASED_OUTPATIENT_CLINIC_OR_DEPARTMENT_OTHER): Payer: Self-pay | Admitting: Cardiovascular Disease

## 2024-06-24 ENCOUNTER — Other Ambulatory Visit (HOSPITAL_BASED_OUTPATIENT_CLINIC_OR_DEPARTMENT_OTHER): Payer: Self-pay

## 2024-06-24 VITALS — BP 157/104 | HR 70 | Ht 65.0 in | Wt 237.0 lb

## 2024-06-24 DIAGNOSIS — I1A0 Resistant hypertension: Secondary | ICD-10-CM | POA: Diagnosis not present

## 2024-06-24 MED ORDER — CLONIDINE 0.2 MG/24HR TD PTWK
MEDICATED_PATCH | TRANSDERMAL | 0 refills | Status: DC
  Filled 2024-06-24: qty 4, 28d supply, fill #0

## 2024-06-24 NOTE — Patient Instructions (Signed)
 Medication Instructions:  GET YOUR MEDICATIONS FILLED, YOU HAVE BEEN GIVEN AN ENTRESTO  INFORMATION TO GET SIGNED UP FOR COPAY CARD   Labwork: NONE  Testing/Procedures: NONE  Follow-Up: 3 TO 4 MONTHS WITH DR [place], CAITLIN W NP, OR KRISTIN A PHARM D   If you need a refill on your cardiac medications before your next appointment, please call your pharmacy.

## 2024-06-24 NOTE — Progress Notes (Signed)
 Advanced Hypertension Clinic Follow up:    Date:  06/24/2024   ID:  Tracy Joseph, DOB July 24, 1971, MRN [id number]  PCP:  Antonetta Rollene BRAVO, MD  Cardiologist:  Stanly DELENA Leavens, MD  Nephrologist:  Referring MD: Antonetta Rollene BRAVO, MD   CC: Hypertension  History of Present Illness:    Tracy Joseph is a 53 y.o. female with a hx of hypertension, prediabetes, hyperlipidemia, CKD IIIb, and OSA, here to establish care in the Advanced Hypertension Clinic. She saw Dr. Leavens in 2021 for malignant hypertension. At the time she was struggling with a lot of stress. Blood pressures were in the 200s/140s. He added spironolactone  and referred her for a sleep study. She was also started on atorvastatin . Her kidney function worsened so spironolactone  was stopped and she was started on labetalol . She followed up with our pharmacist and was working on lifestyle improvement. Isordil  was added to her regimen. She was seen in the ED with chest pain 04/2022. BP was 220/127. BNP was 140 and high sensitivity troponin was mildly elevated but flat. She was treated with IV lasix  and a nitroglycerin  drip. Echo revealed LVEF 45-50% with severe LVH. The LV cavity was severely dilated.  She saw Dr. Lenis and was advised in lifestyle medicine. Labs were negative for hyperaldosteronism.  She is struggled with pretension since her early 30s and it has been consistently difficult to control.  She also has several family members with resistant hypertension.  At her appointment 09/2022 it was unclear why olmesartan  had been discontinued.  She was started on Entresto .  She was referred to the PREP program and enrolled in our remote patient monitoring study.  Renal Dopplers were negative 10/2022.  Labs for hyperaldosteronism were negative 04/2022.  She followed up with our pharmacist and her Entresto  dose was increased.  At her visit 12/2022 blood pressure remained uncontrolled.  Clonidine  patch was increased.  Lipid  panel revealed an ASCVD 10-year risk of 30.9% and rosuvastatin  was added.   03/2023 she was out of Entresto  and BP was elevated.  The dose was increased to 97/103 mg.  She followed up with Tracy Finder, NP 04/2023 and BP remained elevated, again out of her medication.  It was recommended that she add spironolactone  but she hadn't done so at follow up with Tracy Joseph, PharmD 03/2024.   Discussed the use of AI scribe software for clinical note transcription with the patient, who gave verbal consent to proceed.  History of Present Illness Tracy Joseph has been experiencing elevated blood pressure, which she attributes to running out of her medications over the weekend. She last took her medications on Wednesday or Thursday and only had three pills left last night. Her blood pressure readings are typically borderline, around 150/90 or 140/80, with the diastolic reaching 100 at least once a week. She experiences pain at the back of her neck when her blood pressure is elevated. No headaches are reported.  She is currently on multiple medications for hypertension, including labetalol  and spironolactone  25 mg. She has recently started incorporating beet juice and kiwi into her diet as part of a holistic approach to manage her blood pressure, although she has not yet noticed any changes as she just started this regimen. She also participates in line dancing twice a week.  She has a family history of hypertension, with several cousins on similar medications. She has been managing hypertension since her early thirties.  She stays hydrated by drinking water  throughout the day. She  missed a few days of her thyroid  medication recently.  She has a history of irritable bowel syndrome, which improves when she avoids meat. She has been trying to incorporate more plant-based meals into her diet, noting that she feels better and lighter when she does so. She has also been experiencing weight gain and difficulty  losing weight, which she attributes to menopause.  Previous antihypertensives: Ace inhibitors - cough Amlodipine  - LE edema Benicar  - cough, dry, pruritus Spironolactone -worsening renal function  Past Medical History:  Diagnosis Date   ACE-inhibitor cough    Chronic combined systolic and diastolic heart failure (HCC) 05/04/2022   New dx in 04/2022 during hospitalization. Severe LVH, grade2 diastolic dysfunction, EF 45 to 50 %   Chronic GERD    Essential hypertension    Goiter    Initially hyperthyroid treated with RAI, now hypothyroid - follows with Dr. Kassie   Helicobacter pylori ab+    Hypertension    Phreesia 10/12/2020   Hypothyroidism    Iron  deficiency anemia    Mixed hyperlipidemia    Obstructive sleep apnea    Suspected    Prediabetes    Seasonal allergies    Tubular adenoma of colon     Past Surgical History:  Procedure Laterality Date   BIOPSY  07/25/2022   Procedure: BIOPSY;  Surgeon: Eartha Angelia Sieving, MD;  [facility];  Service: Gastroenterology;;   COLONOSCOPY N/A 05/03/2017   Procedure: COLONOSCOPY;  Surgeon: Golda Claudis PENNER, MD;  [facility];  Service: Endoscopy;  Laterality: N/A;  930   COLONOSCOPY WITH PROPOFOL  N/A 07/25/2022   Procedure: COLONOSCOPY WITH PROPOFOL ;  Surgeon: Eartha Angelia Sieving, MD;  Location: [facility];  Service: Gastroenterology;  Laterality: N/A;  1130 am   ESOPHAGOGASTRODUODENOSCOPY (EGD) WITH PROPOFOL  N/A 07/25/2022   Procedure: ESOPHAGOGASTRODUODENOSCOPY (EGD) WITH PROPOFOL ;  Surgeon: Eartha Angelia Sieving, MD;  Location: [facility];  Service: Gastroenterology;  Laterality: N/A;   TUBAL LIGATION  2005    Current Medications: Current Meds  Medication Sig   Azelastine  HCl 137 MCG/SPRAY SOLN PLACE 2 SPRAYS INTO BOTH NOSTRILS 2 (TWO) TIMES DAILY. USE IN EACH NOSTRIL AS DIRECTED   cloNIDine  (CATAPRES  - DOSED IN MG/24 HR) 0.3 mg/24hr patch PLACE 1 PATCH (0.3 MG TOTAL) ONTO THE SKIN ONCE A  WEEK.   ENTRESTO  97-103 MG TAKE 1 TABLET BY MOUTH TWICE A DAY   ferrous sulfate  325 (65 FE) MG EC tablet Take 1 tablet (325 mg total) by mouth daily with breakfast.   fluticasone  (FLONASE ) 50 MCG/ACT nasal spray Place 2 sprays into both nostrils daily.   furosemide  (LASIX ) 40 MG tablet TAKE 1 TABLET BY MOUTH EVERY DAY   hydrALAZINE  (APRESOLINE ) 100 MG tablet TAKE 1 TABLET BY MOUTH TWICE A DAY   isosorbide  mononitrate (IMDUR ) 60 MG 24 hr tablet TAKE 1 TABLET BY MOUTH EVERY DAY   labetalol  (NORMODYNE ) 300 MG tablet TAKE 1 TABLET (300 MG TOTAL) BY MOUTH 2 (TWO) TIMES DAILY. FOR BP   levothyroxine  (SYNTHROID ) 200 MCG tablet Take 1 tablet (200 mcg total) by mouth daily before breakfast.   montelukast  (SINGULAIR ) 10 MG tablet TAKE 1 TABLET BY MOUTH EVERYDAY AT BEDTIME   norethindrone  (AYGESTIN ) 5 MG tablet Take 1 tablet (5 mg total) by mouth daily.   pantoprazole  (PROTONIX ) 40 MG tablet TAKE 1 TABLET (40 MG TOTAL) BY MOUTH TWICE A DAY BEFORE MEALS   rosuvastatin  (CRESTOR ) 10 MG tablet TAKE 1 TABLET BY MOUTH EVERY DAY   spironolactone  (ALDACTONE )  25 MG tablet TAKE 1 TABLET (25 MG TOTAL) BY MOUTH DAILY.   vitamin B-12 (CYANOCOBALAMIN ) 1000 MCG tablet Take 1 tablet (1,000 mcg total) by mouth daily.   Vitamin D , Ergocalciferol , (DRISDOL ) 1.25 MG (50000 UNIT) CAPS capsule TAKE 1 CAPSULE (50,000 UNITS TOTAL) BY MOUTH ONCE A WEEK. ONE CAPSULE ONCE WEEKLY     Allergies:   Ace inhibitors, Dust mite extract, Pollen extract, and Latex   Social History   Socioeconomic History   Marital status: Married    Spouse name: Not on file   Number of children: 1   Years of education: Not on file   Highest education level: Not on file  Occupational History   Occupation: full time student for [employer]  Tobacco Use   Smoking status: Never   Smokeless tobacco: Never  Vaping Use   Vaping status: Never Used  Substance and Sexual Activity   Alcohol use: No   Drug use: No   Sexual activity: Yes    Birth  control/protection: None, Post-menopausal  Other Topics Concern   Not on file  Social History Narrative   Not on file   Social Drivers of Health   Financial Resource Strain: Low Risk  (09/27/2022)   Overall Financial Resource Strain (CARDIA)    Difficulty of Paying Living Expenses: Not hard at all  Food Insecurity: No Food Insecurity (09/27/2022)   Hunger Vital Sign    Worried About Running Out of Food in the Last Year: Never true    Ran Out of Food in the Last Year: Never true  Transportation Needs: No Transportation Needs (09/27/2022)   PRAPARE - Administrator, Civil Service (Medical): No    Lack of Transportation (Non-Medical): No  Physical Activity: Sufficiently Active (09/27/2022)   Exercise Vital Sign    Days of Exercise per Week: 3 days    Minutes of Exercise per Session: 60 min  Stress: No Stress Concern Present (09/27/2022)   [employer] of Occupational Health - Occupational Stress Questionnaire    Feeling of Stress : Not at all  Social Connections: Socially Integrated (09/27/2022)   Social Connection and Isolation Panel    Frequency of Communication with Friends and Family: Three times a week    Frequency of Social Gatherings with Friends and Family: Once a week    Attends Religious Services: More than 4 times per year    Active Member of [employer] or Organizations: Yes    Attends Banker Meetings: 1 to 4 times per year    Marital Status: Married     Family History: The patient's family history includes Coronary artery disease in her maternal aunt and maternal aunt; Hyperlipidemia in her mother; Hypertension in her cousin, maternal aunt, maternal aunt, and mother; Thyroid  disease in her paternal grandmother.  ROS:   Please see the history of present illness.    All other systems reviewed and are negative.  EKGs/Labs/Other Studies Reviewed:    Echocardiogram  05/04/2022:  1. Left ventricular ejection fraction, by estimation, is 45 to  50%. The  left ventricle has mildly decreased function. The left ventricle has no  regional wall motion abnormalities. The left ventricular internal cavity  size was severely dilated. There is  severe left ventricular hypertrophy. Left ventricular diastolic parameters  are consistent with Grade II diastolic dysfunction (pseudonormalization).  Elevated left atrial pressure.   2. Right ventricular systolic function is normal. The right ventricular  size is normal.   3. Left atrial size was severely  dilated.   4. Right atrial size was mildly dilated.   5. The mitral valve is normal in structure. Mild mitral valve  regurgitation.   6. The aortic valve is tricuspid. Aortic valve regurgitation is not  visualized. Aortic valve sclerosis is present, with no evidence of aortic  valve stenosis.   7. The inferior vena cava is normal in size with greater than 50%  respiratory variability, suggesting right atrial pressure of 3 mmHg.    EKG:  EKG is personally reviewed. 09/25/2022:  EKG was not ordered.   Recent Labs: 12/13/2023: Hemoglobin 14.9; Platelets 242 06/23/2024: ALT 10; BUN 18; Creatinine, Ser 1.46; Potassium 3.7; Sodium 135; TSH 14.757   Recent Lipid Panel    Component Value Date/Time   CHOL 186 06/23/2024 1045   CHOL 167 12/13/2023 0846   TRIG 124 06/23/2024 1045   HDL 44 06/23/2024 1045   HDL 54 12/13/2023 0846   CHOLHDL 4.2 06/23/2024 1045   VLDL 25 06/23/2024 1045   LDLCALC 117 (H) 06/23/2024 1045   LDLCALC 89 12/13/2023 0846   LDLCALC 136 (H) 09/16/2019 0847    Physical Exam:    VS:  BP (!) 157/104 (BP Location: Left Arm, Patient Position: Sitting, Cuff Size: Normal)   Pulse 70   Ht 5' 5 (1.651 m)   Wt 237 lb (107.5 kg)   SpO2 99%   BMI 39.44 kg/m  , BMI Body mass index is 39.44 kg/m. GENERAL:  Well appearing HEENT: Pupils equal round and reactive, fundi not visualized, oral mucosa unremarkable NECK:  No jugular venous distention, waveform within normal limits,  carotid upstroke brisk and symmetric, no bruits, no thyromegaly LUNGS:  Clear to auscultation bilaterally HEART:  RRR.  PMI not displaced or sustained,S1 and S2 within normal limits, no S3, no S4, no clicks, no rubs, 2/6 systolic murmur. ABD:  Flat, positive bowel sounds normal in frequency in pitch, no bruits, no rebound, no guarding, no midline pulsatile mass, no hepatomegaly, no splenomegaly EXT:  2 plus pulses throughout, no edema, no cyanosis no clubbing SKIN:  No rashes no nodules NEURO:  Cranial nerves II through XII grossly intact, motor grossly intact throughout PSYCH:  Cognitively intact, oriented to person place and time   ASSESSMENT/PLAN:    Assessment & Plan # Hypertension Poorly controlled with readings often 150/90 mmHg or higher. Likely hereditary given her family history. Renal denervation discussed but insurance coverage is a barrier.  Also struggles with adherence to her medication regimen.  - Resume labetalol , isosorbide , spironolactone , Entresto , amodipine, and hydralazine . - Will not change meds as she was not taking them consistency. - Explore renal denervation when insurance coverage improves. - Encourage medication adherence. - Discuss cost savings by switching pharmacies or using GoodRx. - Consider plant-based diet and weight management.  # Chronic kidney disease, 3a-3b: Well-managed with stable creatinine at 1.46 mg/dL. - Ensure adequate hydration.  # Hypothyroidism Elevated TSH indicates suboptimal control. Out of medication for a few days. - Resume thyroid  medication immediately.  # Irritable bowel syndrome Symptoms improve with plant-based diet, suggesting dietary triggers. - Encourage continuation of plant-based diet.   Screening for Secondary Hypertension:     09/25/2022    9:13 AM 10/26/2022    6:34 AM 05/10/2023    1:13 PM  Causes  Drugs/Herbals Screened       - Comments One tea daily, trying to limit sodium, rare EtOH, no tobaccos use.  No  NSAIDS.    Renovascular HTN Screened Screened      -  Comments Check renal artery Dopplers no RAS   Sleep Apnea Screened  Screened     - Comments sleep study pending  04/2023 mild OSA, CPAP ordered but not yet received  Thyroid  Disease Screened    Hyperaldosteronism Screened    Pheochromocytoma N/A    Cushing's Syndrome N/A    Hyperparathyroidism Screened    Coarctation of the Aorta Screened       - Comments BP symmetric    Compliance Screened      Relevant Labs/Studies:    Latest Ref Rng & Units 06/23/2024   10:53 AM 06/23/2024   10:45 AM 03/20/2024    9:42 AM  Basic Labs  Sodium 135 - 145 mmol/L 135  136  139   Potassium 3.5 - 5.1 mmol/L 3.7  3.7  4.2   Creatinine 0.44 - 1.00 mg/dL 8.53  8.56  8.59        Latest Ref Rng & Units 06/23/2024   10:45 AM 12/13/2023    8:46 AM  Thyroid    TSH 0.350 - 4.500 uIU/mL 14.757  15.000        Latest Ref Rng & Units 05/04/2022    8:38 AM  Renin/Aldosterone   Aldosterone 0.0 - 30.0 ng/dL 81.3              88/12/7974   10:58 AM  Renovascular   Renal Artery US  Completed Yes    Disposition:    FU with APP/PharmD in 3-4 months  Medication Adjustments/Labs and Tests Ordered: Current medicines are reviewed at length with the patient today.  Concerns regarding medicines are outlined above.   Orders Placed This Encounter  Procedures   EKG 12-Lead   No orders of the defined types were placed in this encounter.    Signed, Annabella Scarce, MD  06/24/2024 3:59 PM    [facility]

## 2024-06-30 ENCOUNTER — Other Ambulatory Visit: Payer: Self-pay | Admitting: Family Medicine

## 2024-06-30 ENCOUNTER — Inpatient Hospital Stay: Payer: Managed Care, Other (non HMO) | Admitting: Physician Assistant

## 2024-06-30 ENCOUNTER — Encounter (HOSPITAL_COMMUNITY): Payer: Self-pay | Admitting: Hematology

## 2024-06-30 ENCOUNTER — Other Ambulatory Visit (HOSPITAL_BASED_OUTPATIENT_CLINIC_OR_DEPARTMENT_OTHER): Payer: Self-pay

## 2024-07-01 ENCOUNTER — Other Ambulatory Visit (HOSPITAL_BASED_OUTPATIENT_CLINIC_OR_DEPARTMENT_OTHER): Payer: Self-pay

## 2024-07-01 ENCOUNTER — Telehealth: Payer: Self-pay

## 2024-07-01 ENCOUNTER — Ambulatory Visit: Payer: Self-pay | Admitting: Cardiovascular Disease

## 2024-07-01 NOTE — Telephone Encounter (Signed)
 Copied from CRM [id number]. Topic: General - Other >> Jul 01, 2024  9:33 AM Kevelyn M wrote: Reason for CRM: Patient called in frustrated about not receiving her hydrochlorothiazide  (HYDRODIURIL ) tablet 25 mg. Patient is going to wait to speak to Dr. Antonetta on 07/03/2024 which is her next appointment.

## 2024-07-02 ENCOUNTER — Other Ambulatory Visit: Payer: Self-pay

## 2024-07-02 DIAGNOSIS — D500 Iron deficiency anemia secondary to blood loss (chronic): Secondary | ICD-10-CM

## 2024-07-02 DIAGNOSIS — E538 Deficiency of other specified B group vitamins: Secondary | ICD-10-CM

## 2024-07-03 ENCOUNTER — Encounter (HOSPITAL_COMMUNITY): Payer: Self-pay | Admitting: Hematology

## 2024-07-03 ENCOUNTER — Other Ambulatory Visit (HOSPITAL_BASED_OUTPATIENT_CLINIC_OR_DEPARTMENT_OTHER): Payer: Self-pay

## 2024-07-03 ENCOUNTER — Encounter: Payer: Self-pay | Admitting: Family Medicine

## 2024-07-03 ENCOUNTER — Other Ambulatory Visit (HOSPITAL_COMMUNITY): Payer: Self-pay

## 2024-07-03 ENCOUNTER — Inpatient Hospital Stay

## 2024-07-03 ENCOUNTER — Telehealth: Payer: Self-pay | Admitting: Pharmacy Technician

## 2024-07-03 ENCOUNTER — Ambulatory Visit (INDEPENDENT_AMBULATORY_CARE_PROVIDER_SITE_OTHER): Admitting: Family Medicine

## 2024-07-03 VITALS — BP 142/84 | HR 70 | Resp 18 | Ht 65.0 in | Wt 236.1 lb

## 2024-07-03 DIAGNOSIS — I10 Essential (primary) hypertension: Secondary | ICD-10-CM

## 2024-07-03 DIAGNOSIS — I1A0 Resistant hypertension: Secondary | ICD-10-CM | POA: Diagnosis not present

## 2024-07-03 DIAGNOSIS — Z0001 Encounter for general adult medical examination with abnormal findings: Secondary | ICD-10-CM | POA: Diagnosis not present

## 2024-07-03 DIAGNOSIS — E559 Vitamin D deficiency, unspecified: Secondary | ICD-10-CM

## 2024-07-03 DIAGNOSIS — G4733 Obstructive sleep apnea (adult) (pediatric): Secondary | ICD-10-CM

## 2024-07-03 DIAGNOSIS — I5042 Chronic combined systolic (congestive) and diastolic (congestive) heart failure: Secondary | ICD-10-CM | POA: Diagnosis not present

## 2024-07-03 DIAGNOSIS — R7303 Prediabetes: Secondary | ICD-10-CM

## 2024-07-03 MED ORDER — CLONIDINE 0.3 MG/24HR TD PTWK: 0.3000 mg | MEDICATED_PATCH | TRANSDERMAL | 4 refills | Status: DC

## 2024-07-03 MED ORDER — FUROSEMIDE 40 MG PO TABS
40.0000 mg | ORAL_TABLET | Freq: Every day | ORAL | 3 refills | Status: DC
Start: 2024-07-03 — End: 2024-10-02

## 2024-07-03 MED ORDER — TIRZEPATIDE 2.5 MG/0.5ML ~~LOC~~ SOAJ
2.5000 mg | SUBCUTANEOUS | 0 refills | Status: DC
  Filled 2024-07-03 – 2024-07-08 (×2): qty 2, 28d supply, fill #0

## 2024-07-03 MED ORDER — LABETALOL HCL 300 MG PO TABS: 300.0000 mg | ORAL_TABLET | Freq: Two times a day (BID) | ORAL | 3 refills | Status: DC

## 2024-07-03 MED ORDER — ISOSORBIDE MONONITRATE ER 60 MG PO TB24
60.0000 mg | ORAL_TABLET | Freq: Every day | ORAL | 3 refills | Status: AC
  Filled 2024-12-19: qty 30, 30d supply, fill #0

## 2024-07-03 MED ORDER — HYDRALAZINE HCL 100 MG PO TABS: 100.0000 mg | ORAL_TABLET | Freq: Two times a day (BID) | ORAL | 3 refills | Status: DC

## 2024-07-03 NOTE — Patient Instructions (Addendum)
  MD follow up in 4 months  You are referred to bariatric clinic  Nurse pls send rx for CPAP machine and supplies to pharmacy her ins deals with, start local I suggest  I have sent rx for zepbound / mounjaro  the generic to [facility], message me as to if you can get it  at an affordable price  Please start gettign caught up with your vaccines as soon as possible!  HBA1c, f, BMp and eGFR, vit D 3 to 7 days before next appointment  It is important that you exercise regularly at least 30 minutes 5 times a week. If you develop chest pain, have severe difficulty breathing, or feel very tired, stop exercising immediately and seek medical attention   Thanks for choosing [facility], we consider it a privelige to serve you.

## 2024-07-03 NOTE — Progress Notes (Signed)
    Tracy Joseph     MRN: [id number]      DOB: 05/02/1971  Chief Complaint  Patient presents with   Annual Exam    Cpe     HPI: Patient is in for annual physical exam. No other health concerns are expressed or addressed at the visit. Recent labs,  are reviewed. Immunization is reviewed , and  updated if needed.   PE: BP (!) 142/84   Pulse 70   Resp 18   Ht 5' 5 (1.651 m)   Wt 236 lb 1.3 oz (107.1 kg)   SpO2 95%   BMI 39.29 kg/m   Pleasant  female, alert and oriented x 3, in no cardio-pulmonary distress. Afebrile. HEENT No facial trauma or asymetry. Sinuses non tender.  Extra occullar muscles intact.. External ears normal, . Neck: supple, no adenopathy,JVD or thyromegaly.No bruits.  Chest: Clear to ascultation bilaterally.No crackles or wheezes. Non tender to palpation  Cardiovascular system; Heart sounds normal,  S1 and  S2 ,no S3.  No murmur, or thrill. Apical beat not displaced Peripheral pulses normal.  Abdomen: Soft, non tender,  Musculoskeletal exam: Full ROM of spine, hips , shoulders and knees. No deformity ,swelling or crepitus noted. No muscle wasting or atrophy.   Neurologic: Cranial nerves 2 to 12 intact. Power, tone ,sensation and reflexes normal throughout. No disturbance in gait. No tremor.  Skin: Intact, no ulceration, erythema , scaling or rash noted. Pigmentation normal throughout  Psych; Normal mood and affect. Judgement and concentration normal   Assessment & Plan:  Sleep apnea Needs equipment , sites financial constraints as an obstacle will try to work through this, as management is vital for her health  Annual visit for general adult medical examination with abnormal findings Annual exam as documented. Counseling done  re healthy lifestyle involving commitment to 150 minutes exercise per week, heart healthy diet, and attaining healthy weight.The importance of adequate sleep also discussed. . Changes in health habits are  decided on by the patient with goals and time frames  set for achieving them. Immunization and cancer screening needs are specifically addressed at this visit.

## 2024-07-03 NOTE — Telephone Encounter (Signed)
 Pharmacy Patient Advocate Encounter  Received notification from [employer] that Prior Authorization for Mounjaro  2.5MG /0.5ML auto-injectors  has been APPROVED from 07/03/2024 to 07/03/2025. Ran test claim, Copay is $25.00. This test claim was processed through [facility]- copay amounts may vary at other pharmacies due to pharmacy/plan contracts, or as the patient moves through the different stages of their insurance plan.   PA #/Case ID/Reference #: [id number]

## 2024-07-03 NOTE — Telephone Encounter (Signed)
 Pharmacy Patient Advocate Encounter   Received notification from CoverMyMeds that prior authorization for Mounjaro  2.5MG /0.5ML auto-injectors is required/requested.   Insurance verification completed.   The patient is insured through [employer] .   Per test claim: PA required; PA submitted to above mentioned insurance via LAT[employer] Key/confirmation #/EOC [id number] Status is pending

## 2024-07-06 ENCOUNTER — Encounter (HOSPITAL_BASED_OUTPATIENT_CLINIC_OR_DEPARTMENT_OTHER): Payer: Self-pay | Admitting: Cardiovascular Disease

## 2024-07-07 ENCOUNTER — Other Ambulatory Visit (HOSPITAL_BASED_OUTPATIENT_CLINIC_OR_DEPARTMENT_OTHER): Payer: Self-pay

## 2024-07-08 ENCOUNTER — Other Ambulatory Visit: Payer: Self-pay

## 2024-07-08 ENCOUNTER — Other Ambulatory Visit (HOSPITAL_BASED_OUTPATIENT_CLINIC_OR_DEPARTMENT_OTHER): Payer: Self-pay

## 2024-07-08 ENCOUNTER — Encounter: Payer: Self-pay | Admitting: "Endocrinology

## 2024-07-08 ENCOUNTER — Ambulatory Visit (INDEPENDENT_AMBULATORY_CARE_PROVIDER_SITE_OTHER): Admitting: "Endocrinology

## 2024-07-08 VITALS — BP 154/90 | HR 64 | Ht 65.0 in | Wt 237.2 lb

## 2024-07-08 DIAGNOSIS — I1A0 Resistant hypertension: Secondary | ICD-10-CM

## 2024-07-08 DIAGNOSIS — E782 Mixed hyperlipidemia: Secondary | ICD-10-CM

## 2024-07-08 DIAGNOSIS — E890 Postprocedural hypothyroidism: Secondary | ICD-10-CM

## 2024-07-08 DIAGNOSIS — I10 Essential (primary) hypertension: Secondary | ICD-10-CM | POA: Diagnosis not present

## 2024-07-08 DIAGNOSIS — Z7985 Long-term (current) use of injectable non-insulin antidiabetic drugs: Secondary | ICD-10-CM | POA: Diagnosis not present

## 2024-07-08 DIAGNOSIS — E1122 Type 2 diabetes mellitus with diabetic chronic kidney disease: Secondary | ICD-10-CM

## 2024-07-08 DIAGNOSIS — Z0001 Encounter for general adult medical examination with abnormal findings: Secondary | ICD-10-CM | POA: Insufficient documentation

## 2024-07-08 DIAGNOSIS — E119 Type 2 diabetes mellitus without complications: Secondary | ICD-10-CM | POA: Diagnosis not present

## 2024-07-08 LAB — POCT GLYCOSYLATED HEMOGLOBIN (HGB A1C): HbA1c, POC (controlled diabetic range): 6.6 % (ref 0.0–7.0)

## 2024-07-08 MED ORDER — LEVOTHYROXINE SODIUM 200 MCG PO TABS: 200.0000 ug | ORAL_TABLET | Freq: Every day | ORAL | 1 refills | Status: DC

## 2024-07-08 NOTE — Progress Notes (Signed)
 07/08/2024, 5:43 PM   Endocrinology follow-up note  Subjective:    Patient ID: Tracy Joseph, female    DOB: March 11, 1971, PCP Antonetta Rollene BRAVO, MD   Past Medical History:  Diagnosis Date   ACE-inhibitor cough    Chronic combined systolic and diastolic heart failure (HCC) 05/04/2022   New dx in 04/2022 during hospitalization. Severe LVH, grade2 diastolic dysfunction, EF 45 to 50 %   Chronic GERD    Essential hypertension    Goiter    Initially hyperthyroid treated with RAI, now hypothyroid - follows with Dr. Kassie   Helicobacter pylori ab+    Hypertension    Phreesia 10/12/2020   Hypothyroidism    Iron  deficiency anemia    Mixed hyperlipidemia    Obstructive sleep apnea    Suspected    Prediabetes    Seasonal allergies    Tubular adenoma of colon    Past Surgical History:  Procedure Laterality Date   BIOPSY  07/25/2022   Procedure: BIOPSY;  Surgeon: Eartha Angelia Sieving, MD;  [facility]: [facility];  Service: Gastroenterology;;   COLONOSCOPY N/A 05/03/2017   Procedure: COLONOSCOPY;  Surgeon: Golda Claudis PENNER, MD;  Location: [facility];  Service: Endoscopy;  Laterality: N/A;  930   COLONOSCOPY WITH PROPOFOL  N/A 07/25/2022   Procedure: COLONOSCOPY WITH PROPOFOL ;  Surgeon: Eartha Angelia Sieving, MD;  Location: [facility];  Service: Gastroenterology;  Laterality: N/A;  1130 am   ESOPHAGOGASTRODUODENOSCOPY (EGD) WITH PROPOFOL  N/A 07/25/2022   Procedure: ESOPHAGOGASTRODUODENOSCOPY (EGD) WITH PROPOFOL ;  Surgeon: Eartha Angelia Sieving, MD;  Location: [facility];  Service: Gastroenterology;  Laterality: N/A;   TUBAL LIGATION  2005   Social History   Socioeconomic History   Marital status: Married    Spouse name: Not on file   Number of children: 1   Years of education: Not on file   Highest education level: Not on file  Occupational History   Occupation: full time student for [employer]  Tobacco Use    Smoking status: Never   Smokeless tobacco: Never  Vaping Use   Vaping status: Never Used  Substance and Sexual Activity   Alcohol use: No   Drug use: No   Sexual activity: Yes    Birth control/protection: None, Post-menopausal  Other Topics Concern   Not on file  Social History Narrative   Not on file   Social Drivers of Health   Financial Resource Strain: Low Risk  (09/27/2022)   Overall Financial Resource Strain (CARDIA)    Difficulty of Paying Living Expenses: Not hard at all  Food Insecurity: No Food Insecurity (09/27/2022)   Hunger Vital Sign    Worried About Running Out of Food in the Last Year: Never true    Ran Out of Food in the Last Year: Never true  Transportation Needs: No Transportation Needs (09/27/2022)   PRAPARE - Administrator, Civil Service (Medical): No    Lack of Transportation (Non-Medical): No  Physical Activity: Sufficiently Active (09/27/2022)   Exercise Vital Sign    Days of Exercise per Week: 3 days    Minutes of Exercise per Session: 60 min  Stress: No Stress Concern Present (09/27/2022)   [employer]  of Occupational Health - Occupational Stress Questionnaire    Feeling of Stress : Not at all  Social Connections: Socially Integrated (09/27/2022)   Social Connection and Isolation Panel    Frequency of Communication with Friends and Family: Three times a week    Frequency of Social Gatherings with Friends and Family: Once a week    Attends Religious Services: More than 4 times per year    Active Member of [employer] or Organizations: Yes    Attends Banker Meetings: 1 to 4 times per year    Marital Status: Married   Family History  Problem Relation Age of Onset   Hypertension Mother    Hyperlipidemia Mother    Hypertension Maternal Aunt    Coronary artery disease Maternal Aunt    Hypertension Maternal Aunt    Coronary artery disease Maternal Aunt    Thyroid  disease Paternal Grandmother    Hypertension Cousin     Outpatient Encounter Medications as of 07/08/2024  Medication Sig   amLODipine  (NORVASC ) 10 MG tablet Take 1 tablet (10 mg total) by mouth daily. (Patient not taking: Reported on 07/03/2024)   Azelastine  HCl 137 MCG/SPRAY SOLN PLACE 2 SPRAYS INTO BOTH NOSTRILS 2 (TWO) TIMES DAILY. USE IN EACH NOSTRIL AS DIRECTED   cloNIDine  (CATAPRES  - DOSED IN MG/24 HR) 0.3 mg/24hr patch Place 1 patch (0.3 mg total) onto the skin once a week.   ENTRESTO  97-103 MG TAKE 1 TABLET BY MOUTH TWICE A DAY   ferrous sulfate  325 (65 FE) MG EC tablet Take 1 tablet (325 mg total) by mouth daily with breakfast.   fluticasone  (FLONASE ) 50 MCG/ACT nasal spray Place 2 sprays into both nostrils daily.   furosemide  (LASIX ) 40 MG tablet Take 1 tablet (40 mg total) by mouth daily.   hydrALAZINE  (APRESOLINE ) 100 MG tablet Take 1 tablet (100 mg total) by mouth 2 (two) times daily.   isosorbide  mononitrate (IMDUR ) 60 MG 24 hr tablet Take 1 tablet (60 mg total) by mouth daily.   labetalol  (NORMODYNE ) 300 MG tablet Take 1 tablet (300 mg total) by mouth 2 (two) times daily. For BP   levothyroxine  (SYNTHROID ) 200 MCG tablet Take 1 tablet (200 mcg total) by mouth daily before breakfast.   montelukast  (SINGULAIR ) 10 MG tablet TAKE 1 TABLET BY MOUTH EVERYDAY AT BEDTIME   norethindrone  (AYGESTIN ) 5 MG tablet Take 1 tablet (5 mg total) by mouth daily.   pantoprazole  (PROTONIX ) 40 MG tablet TAKE 1 TABLET (40 MG TOTAL) BY MOUTH TWICE A DAY BEFORE MEALS   rosuvastatin  (CRESTOR ) 10 MG tablet TAKE 1 TABLET BY MOUTH EVERY DAY   spironolactone  (ALDACTONE ) 25 MG tablet TAKE 1 TABLET (25 MG TOTAL) BY MOUTH DAILY.   tirzepatide  (MOUNJARO ) 2.5 MG/0.5ML Pen Inject 2.5 mg into the skin once a week.   vitamin B-12 (CYANOCOBALAMIN ) 1000 MCG tablet Take 1 tablet (1,000 mcg total) by mouth daily.   Vitamin D , Ergocalciferol , (DRISDOL ) 1.25 MG (50000 UNIT) CAPS capsule TAKE 1 CAPSULE (50,000 UNITS TOTAL) BY MOUTH ONCE A WEEK. ONE CAPSULE ONCE WEEKLY    [DISCONTINUED] levothyroxine  (SYNTHROID ) 200 MCG tablet Take 1 tablet (200 mcg total) by mouth daily before breakfast.   No facility-administered encounter medications on file as of 07/08/2024.   ALLERGIES: Allergies  Allergen Reactions   Ace Inhibitors Cough   Dust Mite Extract    Pollen Extract    Latex Itching and Rash    VACCINATION STATUS: Immunization History  Administered Date(s) Administered   Influenza,inj,Quad PF,6+ Mos  10/12/2020, 11/08/2021   PFIZER(Purple Top)SARS-COV-2 Vaccination 03/25/2020, 04/23/2020, 11/23/2020   Pneumococcal Polysaccharide-23 12/22/2020   Tdap 04/06/2015    HPI Kaelea BRIZEIDA MCMURRY is 53 y.o. female who presents today with a medical history as above. 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  She has not been to clinic since July 2024. She underwent RAI thyroid  ablation for hyperthyroidism in 2016.  She was given various doses of levothyroxine  over the years.  During her last visit, she was kept on levothyroxine  200 mcg p.o. daily before breakfast.  Unfortunately patient reports that she ran out of her levothyroxine  more than a month ago.  Her most recent labs are at least 3 weeks without levothyroxine .  These labs are consistent with under replacement. she does not have acute symptoms today.  She reports fluctuating body weight.  She denies dysphagia, shortness of breath, nor voice change.  She denies tremors, palpitation, normal heat intolerance. She has chronically uncontrolled hypertension on several medications.  She claims to have been taking all of her prescribed medications which include clonidine  0.3 mg weekly patch, Entresto  97-103 mg twice a day, Lasix  40 mg p.o. once daily, hydralazine  100 mg p.o. twice a day, Imdur  60 mg p.o. once a day, labetalol  300 mg twice daily, spironolactone  25 mg p.o. once daily.  Despite all these medications which she has not  taking any today, blood pressure was 150/118 at presentation, and 154/90 on second measurement- 1/2 an hour later.   Her point-of-care A1c today is 6.6%, with prior documentation of another A1c above 6.5%, makes a diagnosis of type 2 diabetes.  She was given a prescription for Mounjaro  by her primary care doctor, has not started yet.   She also has hyperlipidemia, most recent labs showing LDL at 117 improving from 859.  She is on Crestor  10 mg p.o. nightly.     Review of Systems  Constitutional: + Fluctuating body weight, no fatigue, no subjective hyperthermia, no subjective hypothermia Eyes: no blurry vision, no xerophthalmia   Objective:       07/08/2024   12:07 PM 07/08/2024   11:35 AM 07/03/2024    9:25 AM  Vitals with BMI  Height  5' 5   Weight  237 lbs 3 oz   BMI  39.47   Systolic 154 150 857  Diastolic 90 118 84  Pulse  64     BP (!) 154/90 Comment: L arm with manual cuff. Dr.Nila Winker made aware.  Pulse 64   Ht 5' 5 (1.651 m)   Wt 237 lb 3.2 oz (107.6 kg)   BMI 39.47 kg/m   Wt Readings from Last 3 Encounters:  07/08/24 237 lb 3.2 oz (107.6 kg)  07/03/24 236 lb 1.3 oz (107.1 kg)  06/24/24 237 lb (107.5 kg)    Physical Exam  Constitutional:  Body mass index is 39.47 kg/m.,  not in acute distress, normal state of mind Eyes: PERRLA, EOMI, no exophthalmos   CMP ( most recent) CMP     Component Value Date/Time   NA 135 06/23/2024 1053   NA 139 03/20/2024 0942   K 3.7 06/23/2024 1053   CL 101 06/23/2024 1053   CO2 23 06/23/2024 1053   GLUCOSE 117 (H) 06/23/2024 1053   BUN 18 06/23/2024 1053   BUN 18 03/20/2024 0942   CREATININE 1.46 (H) 06/23/2024 1053   CREATININE 1.14 (H) 09/16/2019 0847   CALCIUM  9.0 06/23/2024 1053  PROT 7.7 06/23/2024 1045   PROT 7.5 12/13/2023 0846   ALBUMIN 3.8 06/23/2024 1045   ALBUMIN 4.1 12/13/2023 0846   AST 15 06/23/2024 1045   ALT 10 06/23/2024 1045   ALKPHOS 74 06/23/2024 1045   BILITOT 0.8 06/23/2024 1045   BILITOT 0.4  12/13/2023 0846   GFRNONAA 43 (L) 06/23/2024 1053   GFRNONAA 56 (L) 09/16/2019 0847   GFRAA 53 (L) 11/10/2020 0938   GFRAA 65 09/16/2019 0847     Diabetic Labs (most recent): Lab Results  Component Value Date   HGBA1C 6.6 07/08/2024   HGBA1C 6.6 (H) 03/20/2024   HGBA1C 6.4 (H) 12/13/2023     Lipid Panel ( most recent) Lipid Panel     Component Value Date/Time   CHOL 186 06/23/2024 1045   CHOL 167 12/13/2023 0846   TRIG 124 06/23/2024 1045   HDL 44 06/23/2024 1045   HDL 54 12/13/2023 0846   CHOLHDL 4.2 06/23/2024 1045   VLDL 25 06/23/2024 1045   LDLCALC 117 (H) 06/23/2024 1045   LDLCALC 89 12/13/2023 0846   LDLCALC 136 (H) 09/16/2019 0847   LABVLDL 24 12/13/2023 0846      Lab Results  Component Value Date   TSH 14.757 (H) 06/23/2024   TSH 15.000 (H) 12/13/2023   TSH 1.330 06/20/2023   TSH 0.035 (L) 10/06/2022   TSH 2.640 05/17/2022   TSH 21.400 (H) 03/08/2022   TSH 1.460 04/26/2021   TSH 5.99 (H) 04/29/2019   TSH 5.25 (H) 07/05/2017   TSH 6.50 (H) 05/07/2017   FREET4 0.65 06/23/2024   FREET4 0.95 12/13/2023   FREET4 1.09 06/20/2023   FREET4 2.15 (H) 10/06/2022   FREET4 0.31 (L) 04/06/2015      Assessment & Plan:   1. Hypothyroidism following radioiodine therapy 2. Prediabetes 3. Mixed hyperlipidemia 4.  Resistant hypertension  Regarding her hypertension, she is advised to go home and take her morning medications with breakfast.  She denies headaches, chest pain, shortness of breath. There is a question of compliance and consistency with these medications. - We went over the list of her medications and patient states she has all of them at home.  It is better to assure consistency and compliance before adding more medications. Her current regimen includes clonidine  0.3 mg patch weekly, Entresto  97-103 mg p.o. twice daily, Lasix  40 mg p.o. as needed, hydralazine  100 mg p.o. twice daily, Imdur  60 mg p.o. once daily, labetalol  300 mg p.o. twice daily,  spironolactone  25 mg p.o. once a day. She will be considered for labs including aldosterone measurement once she can come off of spironolactone .  After  primary aldosteronism is ruled out, she may benefit from a higher dose of spironolactone .   Her recent labs are due to treatment withdrawal for almost a month.  She is advised to resume levothyroxine  200 mcg p.o. daily before breakfast.   - We discussed about the correct intake of her thyroid  hormone, on empty stomach at fasting, with water , separated by at least 30 minutes from breakfast and other medications,  and separated by more than 4 hours from calcium , iron , multivitamins, acid reflux medications (PPIs). -Patient is made aware of the fact that thyroid  hormone replacement is needed for life, dose to be adjusted by periodic monitoring of thyroid  function tests.  Regarding her dyslipidemia , now type 2 diabetes,  obesity, advised to continue Crestor  10 mg p.o. nightly.   She did not engage with lifestyle medicine nutrition optimally.  Her point-of-care A1c today  6.6% consistent with type 2 diabetes. Is encouraged to start Mounjaro  2.5 mg subcutaneously weekly, to be advanced as she tolerates.  - she acknowledges that there is a room for improvement in her food and drink choices. - Suggestion is made for her to avoid simple carbohydrates  from her diet including Cakes, Sweet Desserts, Ice Cream, Soda (diet and regular), Sweet Tea, Candies, Chips, Cookies, Store Bought Juices, Alcohol , Artificial Sweeteners,  Coffee Creamer, and Sugar-free Products, Lemonade. This will help patient to have more stable blood glucose profile and potentially avoid unintended weight gain.    - she is advised to maintain close follow up with Antonetta Rollene BRAVO, MD for primary care needs.   I spent  26  minutes in the care of the patient today including review of labs from Thyroid  Function, CMP, and other relevant labs ; imaging/biopsy records (current and  previous including abstractions from other facilities); face-to-face time discussing  her lab results and symptoms, medications doses, her options of short and long term treatment based on the latest standards of care / guidelines;   and documenting the encounter.  [name]  participated in the discussions, expressed understanding, and voiced agreement with the above plans.  All questions were answered to her satisfaction. she is encouraged to contact clinic should she have any questions or concerns prior to her return visit.   Follow up plan: Return in about 3 months (around 10/08/2024) for Fasting Labs  in AM B4 8, A1c -NV.   Ranny Earl, MD [facility] [address] [place], [place] [address] Phone: [phone]  Fax: [phone]     07/08/2024, 5:43 PM  This note was partially dictated with voice recognition software. Similar sounding words can be transcribed inadequately or may not  be corrected upon review.

## 2024-07-08 NOTE — Assessment & Plan Note (Signed)
Annual exam as documented. Counseling done  re healthy lifestyle involving commitment to 150 minutes exercise per week, heart healthy diet, and attaining healthy weight.The importance of adequate sleep also discussed. Changes in health habits are decided on by the patient with goals and time frames  set for achieving them. Immunization and cancer screening needs are specifically addressed at this visit. 

## 2024-07-08 NOTE — Assessment & Plan Note (Signed)
 Needs equipment , sites financial constraints as an obstacle will try to work through this, as management is vital for her health

## 2024-07-08 NOTE — Patient Instructions (Signed)

## 2024-07-09 ENCOUNTER — Other Ambulatory Visit (HOSPITAL_BASED_OUTPATIENT_CLINIC_OR_DEPARTMENT_OTHER): Payer: Self-pay

## 2024-07-10 ENCOUNTER — Other Ambulatory Visit (HOSPITAL_BASED_OUTPATIENT_CLINIC_OR_DEPARTMENT_OTHER): Payer: Self-pay

## 2024-07-11 ENCOUNTER — Other Ambulatory Visit: Payer: Self-pay

## 2024-07-11 DIAGNOSIS — G4733 Obstructive sleep apnea (adult) (pediatric): Secondary | ICD-10-CM

## 2024-07-11 MED ORDER — UNABLE TO FIND: 0 refills | Status: AC

## 2024-07-14 ENCOUNTER — Other Ambulatory Visit (HOSPITAL_BASED_OUTPATIENT_CLINIC_OR_DEPARTMENT_OTHER): Payer: Self-pay

## 2024-07-15 ENCOUNTER — Other Ambulatory Visit (HOSPITAL_BASED_OUTPATIENT_CLINIC_OR_DEPARTMENT_OTHER): Payer: Self-pay

## 2024-07-16 ENCOUNTER — Other Ambulatory Visit (HOSPITAL_BASED_OUTPATIENT_CLINIC_OR_DEPARTMENT_OTHER): Payer: Self-pay

## 2024-07-17 ENCOUNTER — Other Ambulatory Visit (HOSPITAL_BASED_OUTPATIENT_CLINIC_OR_DEPARTMENT_OTHER): Payer: Self-pay

## 2024-07-18 ENCOUNTER — Other Ambulatory Visit (HOSPITAL_BASED_OUTPATIENT_CLINIC_OR_DEPARTMENT_OTHER): Payer: Self-pay

## 2024-07-21 ENCOUNTER — Other Ambulatory Visit (HOSPITAL_BASED_OUTPATIENT_CLINIC_OR_DEPARTMENT_OTHER): Payer: Self-pay

## 2024-07-23 ENCOUNTER — Other Ambulatory Visit (HOSPITAL_BASED_OUTPATIENT_CLINIC_OR_DEPARTMENT_OTHER): Payer: Self-pay

## 2024-08-01 ENCOUNTER — Other Ambulatory Visit (HOSPITAL_BASED_OUTPATIENT_CLINIC_OR_DEPARTMENT_OTHER): Payer: Self-pay

## 2024-08-01 ENCOUNTER — Other Ambulatory Visit: Payer: Self-pay | Admitting: Family Medicine

## 2024-08-01 MED ORDER — CLONIDINE 0.3 MG/24HR TD PTWK
0.3000 mg | MEDICATED_PATCH | TRANSDERMAL | 4 refills | Status: AC
  Filled 2024-08-01 – 2024-12-19 (×3): qty 4, 28d supply, fill #0

## 2024-08-01 MED ORDER — FUROSEMIDE 40 MG PO TABS
40.0000 mg | ORAL_TABLET | Freq: Every day | ORAL | 3 refills | Status: AC
  Filled 2024-09-16 – 2024-10-10 (×2): qty 30, 30d supply, fill #0
  Filled 2024-11-05 – 2024-12-19 (×2): qty 30, 30d supply, fill #1
  Filled 2025-01-23: qty 30, 30d supply, fill #2

## 2024-08-01 MED ORDER — LABETALOL HCL 300 MG PO TABS
300.0000 mg | ORAL_TABLET | Freq: Two times a day (BID) | ORAL | 3 refills | Status: AC
  Filled 2024-09-16 – 2025-01-23 (×3): qty 60, 30d supply, fill #0

## 2024-08-04 ENCOUNTER — Other Ambulatory Visit: Payer: Self-pay | Admitting: *Deleted

## 2024-08-04 ENCOUNTER — Other Ambulatory Visit (HOSPITAL_BASED_OUTPATIENT_CLINIC_OR_DEPARTMENT_OTHER): Payer: Self-pay

## 2024-08-04 ENCOUNTER — Telehealth: Payer: Self-pay

## 2024-08-04 MED ORDER — TIRZEPATIDE 5 MG/0.5ML ~~LOC~~ SOAJ
5.0000 mg | SUBCUTANEOUS | 0 refills | Status: DC
  Filled 2024-08-04: qty 2, 28d supply, fill #0
  Filled 2024-09-08 (×2): qty 2, 28d supply, fill #1

## 2024-08-04 MED ORDER — SPIRONOLACTONE 25 MG PO TABS
25.0000 mg | ORAL_TABLET | Freq: Every day | ORAL | 2 refills | Status: DC
  Filled 2024-08-04: qty 30, 30d supply, fill #0

## 2024-08-04 NOTE — Telephone Encounter (Signed)
 Pt needing refill of Mounjaro , please refill if appropriate

## 2024-08-04 NOTE — Addendum Note (Signed)
 Addended by: ANTONETTA ROLLENE BRAVO on: 08/04/2024 10:57 AM   Modules accepted: Orders

## 2024-08-05 ENCOUNTER — Other Ambulatory Visit (HOSPITAL_BASED_OUTPATIENT_CLINIC_OR_DEPARTMENT_OTHER): Payer: Self-pay

## 2024-08-05 ENCOUNTER — Encounter (HOSPITAL_BASED_OUTPATIENT_CLINIC_OR_DEPARTMENT_OTHER): Payer: Self-pay

## 2024-08-05 ENCOUNTER — Other Ambulatory Visit: Payer: Self-pay | Admitting: Family Medicine

## 2024-08-05 MED ORDER — SPIRONOLACTONE 25 MG PO TABS
25.0000 mg | ORAL_TABLET | Freq: Every day | ORAL | 1 refills | Status: DC
  Filled 2024-08-05: qty 30, 30d supply, fill #0
  Filled 2024-09-08 (×2): qty 30, 30d supply, fill #1

## 2024-08-05 MED ORDER — PANTOPRAZOLE SODIUM 40 MG PO TBEC
40.0000 mg | DELAYED_RELEASE_TABLET | Freq: Two times a day (BID) | ORAL | 1 refills | Status: AC
  Filled 2024-08-05: qty 60, 30d supply, fill #0
  Filled 2024-09-08 (×2): qty 60, 30d supply, fill #1
  Filled 2024-11-05 – 2024-12-19 (×2): qty 60, 30d supply, fill #2
  Filled 2025-01-23: qty 60, 30d supply, fill #3

## 2024-08-05 MED ORDER — HYDRALAZINE HCL 100 MG PO TABS
100.0000 mg | ORAL_TABLET | Freq: Two times a day (BID) | ORAL | 3 refills | Status: AC
  Filled 2024-08-05: qty 60, 30d supply, fill #0
  Filled 2024-09-08 (×2): qty 60, 30d supply, fill #1
  Filled 2024-11-05 – 2025-01-23 (×2): qty 60, 30d supply, fill #2

## 2024-08-05 NOTE — Progress Notes (Signed)
 Hydralazine  is sent

## 2024-08-05 NOTE — Progress Notes (Signed)
 2 months spironolactone  sent , bno hydrochlorothiazide  on current med list, cardiology prescribes spironolactone  and she has f/u in  Oct, protonix  refilled

## 2024-08-07 ENCOUNTER — Other Ambulatory Visit (HOSPITAL_BASED_OUTPATIENT_CLINIC_OR_DEPARTMENT_OTHER): Payer: Self-pay

## 2024-08-24 ENCOUNTER — Encounter (HOSPITAL_COMMUNITY): Payer: Self-pay | Admitting: Oncology

## 2024-08-25 ENCOUNTER — Ambulatory Visit: Admitting: Physician Assistant

## 2024-09-08 ENCOUNTER — Other Ambulatory Visit (HOSPITAL_BASED_OUTPATIENT_CLINIC_OR_DEPARTMENT_OTHER): Payer: Self-pay

## 2024-09-16 ENCOUNTER — Other Ambulatory Visit (HOSPITAL_BASED_OUTPATIENT_CLINIC_OR_DEPARTMENT_OTHER): Payer: Self-pay

## 2024-09-17 ENCOUNTER — Other Ambulatory Visit (HOSPITAL_BASED_OUTPATIENT_CLINIC_OR_DEPARTMENT_OTHER): Payer: Self-pay

## 2024-09-23 ENCOUNTER — Other Ambulatory Visit

## 2024-09-23 ENCOUNTER — Ambulatory Visit: Admitting: Obstetrics & Gynecology

## 2024-09-29 ENCOUNTER — Other Ambulatory Visit (HOSPITAL_BASED_OUTPATIENT_CLINIC_OR_DEPARTMENT_OTHER): Payer: Self-pay

## 2024-09-30 ENCOUNTER — Other Ambulatory Visit (HOSPITAL_BASED_OUTPATIENT_CLINIC_OR_DEPARTMENT_OTHER): Payer: Self-pay

## 2024-10-01 NOTE — Progress Notes (Signed)
 [facility] Follow up:    Date:  10/02/2024   ID:  Tracy Joseph, DOB July 22, 1971, MRN [id number]  PCP:  Antonetta Rollene BRAVO, MD  Cardiologist:  Stanly DELENA Leavens, MD  Nephrologist:  Referring MD: Antonetta Rollene BRAVO, MD   CC: Hypertension  History of Present Illness:    Tracy Joseph is a 54 y.o. female with a hx of hypertension, prediabetes, hyperlipidemia, CKD IIIb, and OSA, here for follow up.  She was first seen in the [facility] 09/2022. She saw Dr. Leavens in 2021 for malignant hypertension. At the time she was struggling with a lot of stress. Blood pressures were in the 200s/140s. He added spironolactone  and referred her for a sleep study. She was also started on atorvastatin . Her kidney function worsened so spironolactone  was stopped and she was started on labetalol . She followed up with our pharmacist and was working on lifestyle improvement. Isordil  was added to her regimen. She was seen in the ED with chest pain 04/2022. BP was 220/127. BNP was 140 and high sensitivity troponin was mildly elevated but flat. She was treated with IV lasix  and a nitroglycerin  drip. Echo revealed LVEF 45-50% with severe LVH. The LV cavity was severely dilated.  She saw Dr. Lenis and was advised in lifestyle medicine. Labs were negative for hyperaldosteronism.  She is struggled with pretension since her early 30s and it has been consistently difficult to control.  She also has several family members with resistant hypertension.  At her appointment 09/2022 it was unclear why olmesartan  had been discontinued.  She was started on Entresto .  She was referred to the PREP program and enrolled in our remote patient monitoring study.  Renal Dopplers were negative 10/2022.  Labs for hyperaldosteronism were negative 04/2022.  She followed up with our pharmacist and her Entresto  dose was increased.  At her visit 12/2022 blood pressure remained uncontrolled.  Clonidine   patch was increased.  Lipid panel revealed an ASCVD 10-year risk of 30.9% and rosuvastatin  was added.   03/2023 she was out of Entresto  and BP was elevated.  The dose was increased to 97/103 mg.  She followed up with Reche Finder, NP 04/2023 and BP remained elevated, again out of her medication.  It was recommended that she add spironolactone  but she hadn't done so at follow up with Kristin Alvstad, PharmD 03/2024.   A her visit 06/2024 BP was elevated more than usual, which she attributed to running out of her medication.  We discussed renal denervation and lifestyle modification.  Discussed the use of AI scribe software for clinical note transcription with the patient, who gave verbal consent to proceed.  History of Present Illness Ms. Schoenfeldt has been actively working on weight loss, having lost ten pounds recently due to dietary changes and increased physical activity. Her routine includes walking for forty-five minutes during lunch and going to the gym for an hour. She has incorporated new foods into her diet, such as avocados and beet juice, although she finds the latter to be 'earthy'.  She is currently on multiple medications for hypertension, including clonidine  patch, hydralazine , labetalol , spironolactone , and rosuvastatin  for cholesterol. She takes her medications at 10 AM and 7 PM with food to avoid nausea, noting that she feels lightheaded if taken without food. Her most recent blood pressure reading was 130/90s. She has been on Mounjaro  for two months, which she feels has contributed to her weight loss and improved blood pressure.  She experiences congestion, particularly  in the mornings and at night, leading to a dry and itchy throat. Her husband recently had a bad cold, but she did not contract it. Her cholesterol levels were not ideal during the last check in July, and she is due for blood work soon, which will include a cholesterol panel.  Previous antihypertensives: Ace inhibitors  - cough Amlodipine  - LE edema Benicar  - cough, dry, pruritus Spironolactone -worsening renal function  Past Medical History:  Diagnosis Date   ACE-inhibitor cough    Chronic combined systolic and diastolic heart failure (HCC) 05/04/2022   New dx in 04/2022 during hospitalization. Severe LVH, grade2 diastolic dysfunction, EF 45 to 50 %   Chronic GERD    Essential hypertension    Goiter    Initially hyperthyroid treated with RAI, now hypothyroid - follows with Dr. Kassie   Helicobacter pylori ab+    Hypertension    Phreesia 10/12/2020   Hypothyroidism    Iron  deficiency anemia    Mixed hyperlipidemia    Obstructive sleep apnea    Suspected    Prediabetes    Seasonal allergies    Tubular adenoma of colon     Past Surgical History:  Procedure Laterality Date   BIOPSY  07/25/2022   Procedure: BIOPSY;  Surgeon: Eartha Angelia Sieving, MD;  [facility]: [facility];  Service: Gastroenterology;;   COLONOSCOPY N/A 05/03/2017   Procedure: COLONOSCOPY;  Surgeon: Golda Claudis PENNER, MD;  Location: [facility];  Service: Endoscopy;  Laterality: N/A;  930   COLONOSCOPY WITH PROPOFOL  N/A 07/25/2022   Procedure: COLONOSCOPY WITH PROPOFOL ;  Surgeon: Eartha Angelia Sieving, MD;  Location: [facility];  Service: Gastroenterology;  Laterality: N/A;  1130 am   ESOPHAGOGASTRODUODENOSCOPY (EGD) WITH PROPOFOL  N/A 07/25/2022   Procedure: ESOPHAGOGASTRODUODENOSCOPY (EGD) WITH PROPOFOL ;  Surgeon: Eartha Angelia Sieving, MD;  Location: [facility];  Service: Gastroenterology;  Laterality: N/A;   TUBAL LIGATION  2005    Current Medications: Current Meds  Medication Sig   Azelastine  HCl 137 MCG/SPRAY SOLN PLACE 2 SPRAYS INTO BOTH NOSTRILS 2 (TWO) TIMES DAILY. USE IN EACH NOSTRIL AS DIRECTED   cloNIDine  (CATAPRES  - DOSED IN MG/24 HR) 0.3 mg/24hr patch Place 1 patch (0.3 mg total) onto the skin once a week.   ferrous sulfate  325 (65 FE) MG EC tablet Take 1 tablet (325 mg total) by mouth daily with  breakfast.   furosemide  (LASIX ) 40 MG tablet Take 1 tablet (40 mg total) by mouth daily.   hydrALAZINE  (APRESOLINE ) 100 MG tablet Take 1 tablet (100 mg total) by mouth 2 (two) times daily.   isosorbide  mononitrate (IMDUR ) 60 MG 24 hr tablet Take 1 tablet (60 mg total) by mouth daily.   labetalol  (NORMODYNE ) 300 MG tablet Take 1 tablet (300 mg total) by mouth 2 (two) times daily for blood pressure   levothyroxine  (SYNTHROID ) 200 MCG tablet Take 1 tablet (200 mcg total) by mouth daily before breakfast.   montelukast  (SINGULAIR ) 10 MG tablet TAKE 1 TABLET BY MOUTH EVERYDAY AT BEDTIME   norethindrone  (AYGESTIN ) 5 MG tablet Take 1 tablet (5 mg total) by mouth daily.   pantoprazole  (PROTONIX ) 40 MG tablet Take 1 tablet (40 mg total) by mouth 2 (two) times daily.   rosuvastatin  (CRESTOR ) 10 MG tablet TAKE 1 TABLET BY MOUTH EVERY DAY   sacubitril -valsartan  (ENTRESTO ) 97-103 MG TAKE 1 TABLET BY MOUTH TWICE A DAY   spironolactone  (ALDACTONE ) 25 MG tablet Take 1 tablet (25 mg total) by mouth daily.   tirzepatide  (MOUNJARO ) 5  MG/0.5ML Pen Inject 5 mg into the skin once a week.   UNABLE TO FIND Med Name: Cpap with supplies   vitamin B-12 (CYANOCOBALAMIN ) 1000 MCG tablet Take 1 tablet (1,000 mcg total) by mouth daily.   [DISCONTINUED] furosemide  (LASIX ) 40 MG tablet Take 1 tablet (40 mg total) by mouth daily.   [DISCONTINUED] labetalol  (NORMODYNE ) 300 MG tablet Take 1 tablet (300 mg total) by mouth 2 (two) times daily. For BP     Allergies:   Ace inhibitors, Dust mite extract, Norvasc  [amlodipine ], Pollen extract, and Latex   Social History   Socioeconomic History   Marital status: Married    Spouse name: Not on file   Number of children: 1   Years of education: Not on file   Highest education level: Not on file  Occupational History   Occupation: full time student for [employer]  Tobacco Use   Smoking status: Never   Smokeless tobacco: Never  Vaping Use   Vaping status: Never Used  Substance and  Sexual Activity   Alcohol use: No   Drug use: No   Sexual activity: Yes    Birth control/protection: None, Post-menopausal  Other Topics Concern   Not on file  Social History Narrative   Not on file   Social Drivers of Health   Financial Resource Strain: Low Risk  (09/27/2022)   Overall Financial Resource Strain (CARDIA)    Difficulty of Paying Living Expenses: Not hard at all  Food Insecurity: No Food Insecurity (09/27/2022)   Hunger Vital Sign    Worried About Running Out of Food in the Last Year: Never true    Ran Out of Food in the Last Year: Never true  Transportation Needs: No Transportation Needs (09/27/2022)   PRAPARE - Administrator, Civil Service (Medical): No    Lack of Transportation (Non-Medical): No  Physical Activity: Sufficiently Active (09/27/2022)   Exercise Vital Sign    Days of Exercise per Week: 3 days    Minutes of Exercise per Session: 60 min  Stress: No Stress Concern Present (09/27/2022)   [employer] of Occupational Health - Occupational Stress Questionnaire    Feeling of Stress : Not at all  Social Connections: Socially Integrated (09/27/2022)   Social Connection and Isolation Panel    Frequency of Communication with Friends and Family: Three times a week    Frequency of Social Gatherings with Friends and Family: Once a week    Attends Religious Services: More than 4 times per year    Active Member of [employer] or Organizations: Yes    Attends Banker Meetings: 1 to 4 times per year    Marital Status: Married     Family History: The patient's family history includes Coronary artery disease in her maternal aunt and maternal aunt; Hyperlipidemia in her mother; Hypertension in her cousin, maternal aunt, maternal aunt, and mother; Thyroid  disease in her paternal grandmother.  ROS:   Please see the history of present illness.    All other systems reviewed and are negative.  EKGs/Labs/Other Studies Reviewed:     Echocardiogram  05/04/2022:  1. Left ventricular ejection fraction, by estimation, is 45 to 50%. The  left ventricle has mildly decreased function. The left ventricle has no  regional wall motion abnormalities. The left ventricular internal cavity  size was severely dilated. There is  severe left ventricular hypertrophy. Left ventricular diastolic parameters  are consistent with Grade II diastolic dysfunction (pseudonormalization).  Elevated left atrial pressure.  2. Right ventricular systolic function is normal. The right ventricular  size is normal.   3. Left atrial size was severely dilated.   4. Right atrial size was mildly dilated.   5. The mitral valve is normal in structure. Mild mitral valve  regurgitation.   6. The aortic valve is tricuspid. Aortic valve regurgitation is not  visualized. Aortic valve sclerosis is present, with no evidence of aortic  valve stenosis.   7. The inferior vena cava is normal in size with greater than 50%  respiratory variability, suggesting right atrial pressure of 3 mmHg.    EKG:  EKG is personally reviewed. 09/25/2022:  EKG was not ordered.   Recent Labs: 12/13/2023: Hemoglobin 14.9; Platelets 242 06/23/2024: ALT 10; BUN 18; Creatinine, Ser 1.46; Potassium 3.7; Sodium 135; TSH 14.757   Recent Lipid Panel    Component Value Date/Time   CHOL 186 06/23/2024 1045   CHOL 167 12/13/2023 0846   TRIG 124 06/23/2024 1045   HDL 44 06/23/2024 1045   HDL 54 12/13/2023 0846   CHOLHDL 4.2 06/23/2024 1045   VLDL 25 06/23/2024 1045   LDLCALC 117 (H) 06/23/2024 1045   LDLCALC 89 12/13/2023 0846   LDLCALC 136 (H) 09/16/2019 0847    Physical Exam:    VS:  BP (!) 138/92   Pulse 82   Ht 5' 5 (1.651 m)   Wt 227 lb 6.4 oz (103.1 kg)   SpO2 95%   BMI 37.84 kg/m  , BMI Body mass index is 37.84 kg/m. GENERAL:  Well appearing HEENT: Pupils equal round and reactive, fundi not visualized, oral mucosa unremarkable NECK:  No jugular venous distention,  waveform within normal limits, carotid upstroke brisk and symmetric, no bruits, no thyromegaly LUNGS:  Clear to auscultation bilaterally HEART:  RRR.  PMI not displaced or sustained,S1 and S2 within normal limits, no S3, no S4, no clicks, no rubs, 2/6 systolic murmur. ABD:  Flat, positive bowel sounds normal in frequency in pitch, no bruits, no rebound, no guarding, no midline pulsatile mass, no hepatomegaly, no splenomegaly EXT:  2 plus pulses throughout, no edema, no cyanosis no clubbing SKIN:  No rashes no nodules NEURO:  Cranial nerves II through XII grossly intact, motor grossly intact throughout PSYCH:  Cognitively intact, oriented to person place and time   ASSESSMENT/PLAN:    Assessment & Plan # Resistant hypertension Blood pressure improved to 130/90s mmHg but not at target. On multiple antihypertensives. Nausea occurs if medications taken without food. Otherwise, would recommend switching meds to q12h instead of twice daily.  Weight loss expected to improve control and reduce medication burden. - Continue clonidine  patch, hydralazine , labetalol , Imdur , Entresto , and spironolactone . - Monitor blood pressure, reassess in six months. - Encourage weight loss and exercise.  Suspect her BP will continue to improve with weight loss.  If still elevated at f/u, consider RDN referral.   # Obesity, class 2 Class 2 obesity with 10-pound weight loss. Actively reducing weight through diet and exercise. Continued weight loss crucial for health improvement and medication reduction. - Encourage dietary changes and exercise. - Reassess weight and health in six months. - Continue Mounjaro   # Mixed hyperlipidemia Mixed hyperlipidemia with previous cholesterol levels not at target. On rosuvastatin  10 mg. Cholesterol levels to be reassessed with upcoming blood work. - Order blood work for cholesterol reassessment when she sees her endocriniologist.   - Coordinate testing with endocrinology  appointment.  # OSA: CPAP.  # HFimpEF:  LVEF 60-65%  No HF  symptoms.  Continue blood pressure control as above.  Continue hydralazine , Imdur , labetalol , Entresto , spironolactone , and Mounjaro .  ?  SGLT2 inhibitor.  Recommend repeat echo and follow-up to assess for improvement in LVH given her blood pressures have been better controlled.    Screening for Secondary Hypertension:     09/25/2022    9:13 AM 10/26/2022    6:34 AM 05/10/2023    1:13 PM  Causes  Drugs/Herbals Screened       - Comments One tea daily, trying to limit sodium, rare EtOH, no tobaccos use.  No NSAIDS.    Renovascular HTN Screened Screened      - Comments Check renal artery Dopplers no RAS   Sleep Apnea Screened  Screened     - Comments sleep study pending  04/2023 mild OSA, CPAP ordered but not yet received  Thyroid  Disease Screened    Hyperaldosteronism Screened    Pheochromocytoma N/A    Cushing's Syndrome N/A    Hyperparathyroidism Screened    Coarctation of the Aorta Screened       - Comments BP symmetric    Compliance Screened      Relevant Labs/Studies:    Latest Ref Rng & Units 06/23/2024   10:53 AM 06/23/2024   10:45 AM 03/20/2024    9:42 AM  Basic Labs  Sodium 135 - 145 mmol/L 135  136  139   Potassium 3.5 - 5.1 mmol/L 3.7  3.7  4.2   Creatinine 0.44 - 1.00 mg/dL 8.53  8.56  8.59        Latest Ref Rng & Units 06/23/2024   10:45 AM 12/13/2023    8:46 AM  Thyroid    TSH 0.350 - 4.500 uIU/mL 14.757  15.000        Latest Ref Rng & Units 05/04/2022    8:38 AM  Renin/Aldosterone   Aldosterone 0.0 - 30.0 ng/dL 81.3              88/12/7974   10:58 AM  Renovascular   Renal Artery US  Completed Yes    Disposition:    FU with APP/PharmD in 6 months  Medication Adjustments/Labs and Tests Ordered: Current medicines are reviewed at length with the patient today.  Concerns regarding medicines are outlined above.   Orders Placed This Encounter  Procedures   Lipid panel   No orders of the defined  types were placed in this encounter.    Signed, Annabella Scarce, MD  10/02/2024 10:38 AM    [facility]

## 2024-10-02 ENCOUNTER — Encounter (HOSPITAL_BASED_OUTPATIENT_CLINIC_OR_DEPARTMENT_OTHER): Payer: Self-pay | Admitting: Cardiovascular Disease

## 2024-10-02 ENCOUNTER — Ambulatory Visit (INDEPENDENT_AMBULATORY_CARE_PROVIDER_SITE_OTHER): Admitting: Cardiovascular Disease

## 2024-10-02 VITALS — BP 138/92 | HR 82 | Ht 65.0 in | Wt 227.4 lb

## 2024-10-02 DIAGNOSIS — E782 Mixed hyperlipidemia: Secondary | ICD-10-CM

## 2024-10-02 DIAGNOSIS — I1A0 Resistant hypertension: Secondary | ICD-10-CM

## 2024-10-02 DIAGNOSIS — I5042 Chronic combined systolic (congestive) and diastolic (congestive) heart failure: Secondary | ICD-10-CM | POA: Diagnosis not present

## 2024-10-02 DIAGNOSIS — Z6836 Body mass index (BMI) 36.0-36.9, adult: Secondary | ICD-10-CM

## 2024-10-02 DIAGNOSIS — G4733 Obstructive sleep apnea (adult) (pediatric): Secondary | ICD-10-CM

## 2024-10-02 DIAGNOSIS — N1832 Chronic kidney disease, stage 3b: Secondary | ICD-10-CM

## 2024-10-02 DIAGNOSIS — E66812 Obesity, class 2: Secondary | ICD-10-CM

## 2024-10-02 NOTE — Patient Instructions (Signed)
 Medication Instructions:  Your physician recommends that you continue on your current medications as directed. Please refer to the Current Medication list given to you today.   Labwork: FASTING LIPIDS NEXT MONTH   Testing/Procedures: NONE  Follow-Up: 6 MONTHS WITH DR [place] OR CAITLIN W NP   If you need a refill on your cardiac medications before your next appointment, please call your pharmacy.

## 2024-10-09 ENCOUNTER — Other Ambulatory Visit (HOSPITAL_BASED_OUTPATIENT_CLINIC_OR_DEPARTMENT_OTHER): Payer: Self-pay

## 2024-10-09 ENCOUNTER — Ambulatory Visit: Admitting: "Endocrinology

## 2024-10-09 ENCOUNTER — Encounter: Payer: Self-pay | Admitting: "Endocrinology

## 2024-10-09 VITALS — BP 144/108 | HR 76 | Ht 65.0 in | Wt 228.6 lb

## 2024-10-09 DIAGNOSIS — E890 Postprocedural hypothyroidism: Secondary | ICD-10-CM

## 2024-10-09 DIAGNOSIS — N1832 Chronic kidney disease, stage 3b: Secondary | ICD-10-CM

## 2024-10-09 DIAGNOSIS — Z7985 Long-term (current) use of injectable non-insulin antidiabetic drugs: Secondary | ICD-10-CM

## 2024-10-09 DIAGNOSIS — E1122 Type 2 diabetes mellitus with diabetic chronic kidney disease: Secondary | ICD-10-CM | POA: Diagnosis not present

## 2024-10-09 DIAGNOSIS — N1831 Chronic kidney disease, stage 3a: Secondary | ICD-10-CM

## 2024-10-09 DIAGNOSIS — E782 Mixed hyperlipidemia: Secondary | ICD-10-CM

## 2024-10-09 DIAGNOSIS — I1A0 Resistant hypertension: Secondary | ICD-10-CM | POA: Diagnosis not present

## 2024-10-09 DIAGNOSIS — Z6836 Body mass index (BMI) 36.0-36.9, adult: Secondary | ICD-10-CM

## 2024-10-09 DIAGNOSIS — E66812 Obesity, class 2: Secondary | ICD-10-CM

## 2024-10-09 LAB — COMPREHENSIVE METABOLIC PANEL WITH GFR
ALT: 6 IU/L (ref 0–32)
AST: 17 IU/L (ref 0–40)
Albumin: 4.4 g/dL (ref 3.8–4.9)
Alkaline Phosphatase: 94 IU/L (ref 49–135)
BUN/Creatinine Ratio: 12 (ref 9–23)
BUN: 25 mg/dL — ABNORMAL HIGH (ref 6–24)
Bilirubin Total: 0.4 mg/dL (ref 0.0–1.2)
CO2: 21 mmol/L (ref 20–29)
Calcium: 9.5 mg/dL (ref 8.7–10.2)
Chloride: 99 mmol/L (ref 96–106)
Creatinine, Ser: 2.03 mg/dL — ABNORMAL HIGH (ref 0.57–1.00)
Globulin, Total: 2.9 g/dL (ref 1.5–4.5)
Glucose: 85 mg/dL (ref 70–99)
Potassium: 4.3 mmol/L (ref 3.5–5.2)
Sodium: 138 mmol/L (ref 134–144)
Total Protein: 7.3 g/dL (ref 6.0–8.5)
eGFR: 29 mL/min/1.73 — ABNORMAL LOW (ref >59)

## 2024-10-09 LAB — POCT GLYCOSYLATED HEMOGLOBIN (HGB A1C): HbA1c, POC (controlled diabetic range): 6 % (ref 0.0–7.0)

## 2024-10-09 LAB — TSH: TSH: 0.325 u[IU]/mL — ABNORMAL LOW (ref 0.450–4.500)

## 2024-10-09 LAB — T4, FREE: Free T4: 2.08 ng/dL — ABNORMAL HIGH (ref 0.82–1.77)

## 2024-10-09 LAB — ENHANCED LIVER FIBROSIS (ELF): ELF(TM) Score: 10.13 — AB (ref <9.80)

## 2024-10-09 LAB — FIB-4 W/REFLEX TO ELF
FIB-4 Index: 1.38 (ref 0.00–2.67)
Platelets: 272 x10E3/uL (ref 150–450)

## 2024-10-09 MED ORDER — LEVOTHYROXINE SODIUM 175 MCG PO TABS
175.0000 ug | ORAL_TABLET | Freq: Every day | ORAL | 1 refills | Status: AC
  Filled 2024-10-09: qty 30, 30d supply, fill #0
  Filled 2024-11-05 – 2024-12-19 (×2): qty 30, 30d supply, fill #1
  Filled 2025-01-23: qty 30, 30d supply, fill #2

## 2024-10-09 MED ORDER — TIRZEPATIDE 7.5 MG/0.5ML ~~LOC~~ SOAJ
7.5000 mg | SUBCUTANEOUS | 1 refills | Status: AC
  Filled 2024-10-09: qty 2, 28d supply, fill #0
  Filled 2024-11-05 – 2024-12-12 (×2): qty 2, 28d supply, fill #1
  Filled 2025-01-23: qty 2, 28d supply, fill #2

## 2024-10-09 NOTE — Patient Instructions (Signed)

## 2024-10-09 NOTE — Progress Notes (Signed)
 10/09/2024, 12:50 PM   Endocrinology follow-up note  Subjective:    Patient ID: Tracy Joseph, female    DOB: [birth year], PCP Antonetta Rollene BRAVO, MD   Past Medical History:  Diagnosis Date   ACE-inhibitor cough    Chronic combined systolic and diastolic heart failure (HCC) 05/04/2022   New dx in 04/2022 during hospitalization. Severe LVH, grade2 diastolic dysfunction, EF 45 to 50 %   Chronic GERD    Essential hypertension    Goiter    Initially hyperthyroid treated with RAI, now hypothyroid - follows with Dr. Kassie   Helicobacter pylori ab+    Hypertension    Phreesia 10/12/2020   Hypothyroidism    Iron  deficiency anemia    Mixed hyperlipidemia    Obstructive sleep apnea    Suspected    Prediabetes    Seasonal allergies    Tubular adenoma of colon    Past Surgical History:  Procedure Laterality Date   BIOPSY  07/25/2022   Procedure: BIOPSY;  Surgeon: Eartha Angelia Sieving, MD;  [facility]: [facility];  Service: Gastroenterology;;   COLONOSCOPY N/A 05/03/2017   Procedure: COLONOSCOPY;  Surgeon: Golda Claudis PENNER, MD;  Location: [facility];  Service: Endoscopy;  Laterality: N/A;  930   COLONOSCOPY WITH PROPOFOL  N/A 07/25/2022   Procedure: COLONOSCOPY WITH PROPOFOL ;  Surgeon: Eartha Angelia Sieving, MD;  Location: [facility];  Service: Gastroenterology;  Laterality: N/A;  1130 am   ESOPHAGOGASTRODUODENOSCOPY (EGD) WITH PROPOFOL  N/A 07/25/2022   Procedure: ESOPHAGOGASTRODUODENOSCOPY (EGD) WITH PROPOFOL ;  Surgeon: Eartha Angelia Sieving, MD;  Location: [facility];  Service: Gastroenterology;  Laterality: N/A;   TUBAL LIGATION  2005   Social History   Socioeconomic History   Marital status: Married    Spouse name: Not on file   Number of children: 1   Years of education: Not on file   Highest education level: Not on file  Occupational History   Occupation: full time student for [employer]  Tobacco Use    Smoking status: Never   Smokeless tobacco: Never  Vaping Use   Vaping status: Never Used  Substance and Sexual Activity   Alcohol use: No   Drug use: No   Sexual activity: Yes    Birth control/protection: None, Post-menopausal  Other Topics Concern   Not on file  Social History Narrative   Not on file   Social Drivers of Health   Financial Resource Strain: Low Risk  (09/27/2022)   Overall Financial Resource Strain (CARDIA)    Difficulty of Paying Living Expenses: Not hard at all  Food Insecurity: No Food Insecurity (09/27/2022)   Hunger Vital Sign    Worried About Running Out of Food in the Last Year: Never true    Ran Out of Food in the Last Year: Never true  Transportation Needs: No Transportation Needs (09/27/2022)   PRAPARE - Administrator, Civil Service (Medical): No    Lack of Transportation (Non-Medical): No  Physical Activity: Sufficiently Active (09/27/2022)   Exercise Vital Sign    Days of Exercise per Week: 3 days    Minutes of Exercise per Session: 60 min  Stress: No Stress Concern Present (09/27/2022)   [employer]  of Occupational Health - Occupational Stress Questionnaire    Feeling of Stress : Not at all  Social Connections: Socially Integrated (09/27/2022)   Social Connection and Isolation Panel    Frequency of Communication with Friends and Family: Three times a week    Frequency of Social Gatherings with Friends and Family: Once a week    Attends Religious Services: More than 4 times per year    Active Member of [employer] or Organizations: Yes    Attends Banker Meetings: 1 to 4 times per year    Marital Status: Married   Family History  Problem Relation Age of Onset   Hypertension Mother    Hyperlipidemia Mother    Hypertension Maternal Aunt    Coronary artery disease Maternal Aunt    Hypertension Maternal Aunt    Coronary artery disease Maternal Aunt    Thyroid  disease Paternal Grandmother    Hypertension Cousin     Outpatient Encounter Medications as of 10/09/2024  Medication Sig   levothyroxine  (SYNTHROID ) 175 MCG tablet Take 1 tablet (175 mcg total) by mouth daily before breakfast.   tirzepatide  (MOUNJARO ) 7.5 MG/0.5ML Pen Inject 7.5 mg into the skin once a week.   Azelastine  HCl 137 MCG/SPRAY SOLN PLACE 2 SPRAYS INTO BOTH NOSTRILS 2 (TWO) TIMES DAILY. USE IN EACH NOSTRIL AS DIRECTED   cloNIDine  (CATAPRES  - DOSED IN MG/24 HR) 0.3 mg/24hr patch Place 1 patch (0.3 mg total) onto the skin once a week.   ferrous sulfate  325 (65 FE) MG EC tablet Take 1 tablet (325 mg total) by mouth daily with breakfast.   fluticasone  (FLONASE ) 50 MCG/ACT nasal spray Place 2 sprays into both nostrils daily. (Patient not taking: Reported on 10/02/2024)   furosemide  (LASIX ) 40 MG tablet Take 1 tablet (40 mg total) by mouth daily.   hydrALAZINE  (APRESOLINE ) 100 MG tablet Take 1 tablet (100 mg total) by mouth 2 (two) times daily.   isosorbide  mononitrate (IMDUR ) 60 MG 24 hr tablet Take 1 tablet (60 mg total) by mouth daily.   labetalol  (NORMODYNE ) 300 MG tablet Take 1 tablet (300 mg total) by mouth 2 (two) times daily for blood pressure   montelukast  (SINGULAIR ) 10 MG tablet TAKE 1 TABLET BY MOUTH EVERYDAY AT BEDTIME   norethindrone  (AYGESTIN ) 5 MG tablet Take 1 tablet (5 mg total) by mouth daily.   pantoprazole  (PROTONIX ) 40 MG tablet Take 1 tablet (40 mg total) by mouth 2 (two) times daily.   rosuvastatin  (CRESTOR ) 10 MG tablet TAKE 1 TABLET BY MOUTH EVERY DAY   sacubitril -valsartan  (ENTRESTO ) 97-103 MG TAKE 1 TABLET BY MOUTH TWICE A DAY   spironolactone  (ALDACTONE ) 25 MG tablet Take 1 tablet (25 mg total) by mouth daily.   UNABLE TO FIND Med Name: Cpap with supplies   vitamin B-12 (CYANOCOBALAMIN ) 1000 MCG tablet Take 1 tablet (1,000 mcg total) by mouth daily.   [DISCONTINUED] levothyroxine  (SYNTHROID ) 200 MCG tablet Take 1 tablet (200 mcg total) by mouth daily before breakfast.   [DISCONTINUED] tirzepatide  (MOUNJARO ) 5  MG/0.5ML Pen Inject 5 mg into the skin once a week.   [DISCONTINUED] Vitamin D , Ergocalciferol , (DRISDOL ) 1.25 MG (50000 UNIT) CAPS capsule TAKE 1 CAPSULE (50,000 UNITS TOTAL) BY MOUTH ONCE A WEEK. ONE CAPSULE ONCE WEEKLY (Patient not taking: Reported on 10/02/2024)   No facility-administered encounter medications on file as of 10/09/2024.   ALLERGIES: Allergies  Allergen Reactions   Ace Inhibitors Cough   Dust Mite Extract    Norvasc  [Amlodipine ] Swelling  Cough and dry skin    Pollen Extract    Latex Itching and Rash    VACCINATION STATUS: Immunization History  Administered Date(s) Administered   Influenza,inj,Quad PF,6+ Mos 10/12/2020, 11/08/2021   PFIZER(Purple Top)SARS-COV-2 Vaccination 03/25/2020, 04/23/2020, 11/23/2020   Pneumococcal Polysaccharide-23 12/22/2020   Tdap 04/06/2015    HPI Tracy Joseph is 53 y.o. female who presents today with a medical history as above. 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 Original consult requested by Antonetta Rollene BRAVO, MD.   She underwent RAI thyroid  ablation for hyperthyroidism in 2016.  She was given various doses of levothyroxine  over the years.  She is currently on  levothyroxine  200 mcg p.o. daily before breakfast.  Her previsit thyroid  function test are consistent with slight over replacement.  She was recently initiated on Mounjaro  for diabetes/weight management which helped her lose 10 pounds in the interval. -She feels better, she does not have any acute new complaints today.  She denies dysphagia, shortness of breath, nor voice change.  She denies tremors, palpitation, normal heat intolerance. She has chronically uncontrolled hypertension on several medications.  She is following with cardiology for this concern. -She is currently on clonidine  0.3 mg/24-hour patch, hydralazine  100 mg p.o. twice daily, Imdur  60 mg p.o. once daily, labetalol  300 mg p.o. twice daily, Entresto   97-103 mg twice daily, spironolactone  25 mg p.o. once a day. As usual, she has not taken her blood pressure medication this morning.  Her blood pressure was 144/108 at point-of-care today.   Her point-of-care A1c today is 6% improving from 6.6%.  She also has hyperlipidemia, most recent labs showing LDL at 117 improving from 859.  She is on Crestor  10 mg p.o. nightly.     Review of Systems  Constitutional: + Fluctuating body weight, no fatigue, no subjective hyperthermia, no subjective hypothermia Eyes: no blurry vision, no xerophthalmia   Objective:       10/09/2024   11:10 AM 10/09/2024   10:45 AM 10/02/2024    9:36 AM  Vitals with BMI  Height  5' 5 5' 5  Weight  228 lbs 10 oz 227 lbs 6 oz  BMI  38.04 37.84  Systolic 144 148 861  Diastolic 108 110 92  Pulse  76 82    BP (!) 144/108 Comment: L arm with manual cuff. Pt states she has not taken her medication today. Dr.Daveena Elmore made aware.  Pulse 76   Ht 5' 5 (1.651 m)   Wt 228 lb 9.6 oz (103.7 kg)   BMI 38.04 kg/m   Wt Readings from Last 3 Encounters:  10/09/24 228 lb 9.6 oz (103.7 kg)  10/02/24 227 lb 6.4 oz (103.1 kg)  07/08/24 237 lb 3.2 oz (107.6 kg)    Physical Exam  Constitutional:  Body mass index is 38.04 kg/m.,  not in acute distress, normal state of mind Eyes: PERRLA, EOMI, no exophthalmos   CMP ( most recent) CMP     Component Value Date/Time   NA 138 10/02/2024 1040   K 4.3 10/02/2024 1040   CL 99 10/02/2024 1040   CO2 21 10/02/2024 1040   GLUCOSE 85 10/02/2024 1040   GLUCOSE 117 (H) 06/23/2024 1053   BUN 25 (H) 10/02/2024 1040   CREATININE 2.03 (H) 10/02/2024 1040   CREATININE 1.14 (H) 09/16/2019 0847   CALCIUM  9.5 10/02/2024 1040   PROT 7.3 10/02/2024 1040   ALBUMIN 4.4 10/02/2024 1040   AST 17 10/02/2024 1040  ALT 6 10/02/2024 1040   ALKPHOS 94 10/02/2024 1040   BILITOT 0.4 10/02/2024 1040   GFRNONAA 43 (L) 06/23/2024 1053   GFRNONAA 56 (L) 09/16/2019 0847   GFRAA 53 (L) 11/10/2020  0938   GFRAA 65 09/16/2019 0847     Diabetic Labs (most recent): Lab Results  Component Value Date   HGBA1C 6.0 10/09/2024   HGBA1C 6.6 07/08/2024   HGBA1C 6.6 (H) 03/20/2024     Lipid Panel ( most recent) Lipid Panel     Component Value Date/Time   CHOL 186 06/23/2024 1045   CHOL 167 12/13/2023 0846   TRIG 124 06/23/2024 1045   HDL 44 06/23/2024 1045   HDL 54 12/13/2023 0846   CHOLHDL 4.2 06/23/2024 1045   VLDL 25 06/23/2024 1045   LDLCALC 117 (H) 06/23/2024 1045   LDLCALC 89 12/13/2023 0846   LDLCALC 136 (H) 09/16/2019 0847   LABVLDL 24 12/13/2023 0846      Lab Results  Component Value Date   TSH 0.325 (L) 10/02/2024   TSH 14.757 (H) 06/23/2024   TSH 15.000 (H) 12/13/2023   TSH 1.330 06/20/2023   TSH 0.035 (L) 10/06/2022   TSH 2.640 05/17/2022   TSH 21.400 (H) 03/08/2022   TSH 1.460 04/26/2021   TSH 5.99 (H) 04/29/2019   TSH 5.25 (H) 07/05/2017   FREET4 2.08 (H) 10/02/2024   FREET4 0.65 06/23/2024   FREET4 0.95 12/13/2023   FREET4 1.09 06/20/2023   FREET4 2.15 (H) 10/06/2022   FREET4 0.31 (L) 04/06/2015      Assessment & Plan:   1. Hypothyroidism following radioiodine therapy 2.  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 Mixed hyperlipidemia 4.  Resistant hypertension  Regarding her hypertension, she is advised to go home and take her morning medications with breakfast.  She denies headaches, chest pain, shortness of breath. There is a question of compliance and consistency in this patient with this medications.    It is better to assure consistency and compliance before adding more medications. Her current regimen includes clonidine  0.3 mg patch weekly, Entresto  97-103 mg p.o. twice daily, Lasix  40 mg p.o. as needed, hydralazine  100 mg p.o. twice daily, Imdur  60 mg p.o. once daily, labetalol  300 mg p.o. twice daily, spironolactone  25 mg p.o. once a day. She will be considered for labs including aldosterone measurement once she can come off of  spironolactone .   Her previsit thyroid  function tests are consistent with over replacement.  Discussed and lowered her levothyroxine  to 175 mcg p.o. daily before breakfast.   - We discussed about the correct intake of her thyroid  hormone, on empty stomach at fasting, with water , separated by at least 30 minutes from breakfast and other medications,  and separated by more than 4 hours from calcium , iron , multivitamins, acid reflux medications (PPIs). -Patient is made aware of the fact that thyroid  hormone replacement is needed for life, dose to be adjusted by periodic monitoring of thyroid  function tests.   Regarding her dyslipidemia , now type 2 diabetes,  obesity, advised to continue Crestor  10 mg p.o. nightly.   She is benefiting from Mounjaro .  I discussed and increased her dose to 7.5 mg subcutaneously weekly.  This medication will be advanced as she tolerates.    - she acknowledges that there is a room for improvement in her food and drink choices. - Suggestion is made for her to avoid simple carbohydrates  from her diet including Cakes, Sweet Desserts, Ice Cream, Soda (diet and regular), Sweet  Tea, Candies, Chips, Cookies, Store Bought Juices, Alcohol , Artificial Sweeteners,  Coffee Creamer, and Sugar-free Products, Lemonade. This will help patient to have more stable blood glucose profile and potentially avoid unintended weight gain.    She is encouraged to continue rosuvastatin  10 mg p.o. nightly.  Side effects and precaution discussed with her. - she is advised to maintain close follow up with Antonetta Rollene BRAVO, MD for primary care needs.   I spent  42  minutes in the care of the patient today including review of labs from CMP, Lipids, Thyroid  Function, Hematology (current and previous including abstractions from other facilities); face-to-face time discussing  her blood glucose readings/logs, discussing hypoglycemia and hyperglycemia episodes and symptoms, medications doses, her  options of short and long term treatment based on the latest standards of care / guidelines;  discussion about incorporating lifestyle medicine;  and documenting the encounter. Risk reduction counseling performed per USPSTF guidelines to reduce  obesity and cardiovascular risk factors.     Please refer to Patient Instructions for Blood Glucose Monitoring and Insulin/Medications Dosing Guide  in media tab for additional information. Please  also refer to  Patient Self Inventory in the Media  tab for reviewed elements of pertinent patient history.  Tracy Joseph participated in the discussions, expressed understanding, and voiced agreement with the above plans.  All questions were answered to her satisfaction. she is encouraged to contact clinic should she have any questions or concerns prior to her return visit.    Follow up plan: Return in about 4 months (around 02/09/2025) for F/U with Pre-visit Labs, A1c -NV.   Ranny Earl, MD [facility] [address] [place], [place] [address] Phone: [phone]  Fax: [phone]     10/09/2024, 12:50 PM  This note was partially dictated with voice recognition software. Similar sounding words can be transcribed inadequately or may not  be corrected upon review.

## 2024-10-10 ENCOUNTER — Encounter (HOSPITAL_BASED_OUTPATIENT_CLINIC_OR_DEPARTMENT_OTHER): Payer: Self-pay | Admitting: Cardiovascular Disease

## 2024-10-10 ENCOUNTER — Other Ambulatory Visit (HOSPITAL_BASED_OUTPATIENT_CLINIC_OR_DEPARTMENT_OTHER): Payer: Self-pay

## 2024-10-13 ENCOUNTER — Other Ambulatory Visit

## 2024-10-13 ENCOUNTER — Ambulatory Visit: Admitting: Obstetrics & Gynecology

## 2024-11-04 ENCOUNTER — Ambulatory Visit: Admitting: Family Medicine

## 2024-11-05 ENCOUNTER — Other Ambulatory Visit (HOSPITAL_BASED_OUTPATIENT_CLINIC_OR_DEPARTMENT_OTHER): Payer: Self-pay

## 2024-11-05 ENCOUNTER — Other Ambulatory Visit: Payer: Self-pay | Admitting: Family Medicine

## 2024-11-05 MED FILL — Sacubitril-Valsartan Tab 97-103 MG: ORAL | 30 days supply | Qty: 60 | Fill #0 | Status: CN

## 2024-11-06 ENCOUNTER — Other Ambulatory Visit (HOSPITAL_BASED_OUTPATIENT_CLINIC_OR_DEPARTMENT_OTHER): Payer: Self-pay

## 2024-11-06 ENCOUNTER — Ambulatory Visit: Admitting: Obstetrics & Gynecology

## 2024-11-06 ENCOUNTER — Other Ambulatory Visit

## 2024-11-06 MED ORDER — SPIRONOLACTONE 25 MG PO TABS
25.0000 mg | ORAL_TABLET | Freq: Every day | ORAL | 1 refills | Status: AC
  Filled 2024-11-06 – 2024-12-19 (×2): qty 30, 30d supply, fill #0
  Filled 2025-01-23: qty 30, 30d supply, fill #1

## 2024-11-19 ENCOUNTER — Other Ambulatory Visit (HOSPITAL_BASED_OUTPATIENT_CLINIC_OR_DEPARTMENT_OTHER): Payer: Self-pay

## 2024-12-12 ENCOUNTER — Other Ambulatory Visit (HOSPITAL_BASED_OUTPATIENT_CLINIC_OR_DEPARTMENT_OTHER): Payer: Self-pay

## 2024-12-19 ENCOUNTER — Other Ambulatory Visit (HOSPITAL_BASED_OUTPATIENT_CLINIC_OR_DEPARTMENT_OTHER): Payer: Self-pay

## 2025-01-05 ENCOUNTER — Other Ambulatory Visit (HOSPITAL_BASED_OUTPATIENT_CLINIC_OR_DEPARTMENT_OTHER): Payer: Self-pay

## 2025-01-23 ENCOUNTER — Other Ambulatory Visit (HOSPITAL_BASED_OUTPATIENT_CLINIC_OR_DEPARTMENT_OTHER): Payer: Self-pay

## 2025-01-23 MED FILL — Sacubitril-Valsartan Tab 97-103 MG: ORAL | 30 days supply | Qty: 60 | Fill #0 | Status: AC

## 2025-02-10 ENCOUNTER — Ambulatory Visit: Admitting: "Endocrinology
